# Patient Record
Sex: Female | Born: 1960 | Race: White | Hispanic: No | Smoking: Never smoker
Health system: Southern US, Community
[De-identification: ages and names within clinical notes are randomized; demographics above are authoritative.]

## PROBLEM LIST (undated history)

## (undated) DIAGNOSIS — I509 Heart failure, unspecified: Secondary | ICD-10-CM

## (undated) DIAGNOSIS — R7303 Prediabetes: Secondary | ICD-10-CM

## (undated) DIAGNOSIS — R739 Hyperglycemia, unspecified: Secondary | ICD-10-CM

## (undated) DIAGNOSIS — M1990 Unspecified osteoarthritis, unspecified site: Secondary | ICD-10-CM

## (undated) DIAGNOSIS — I5021 Acute systolic (congestive) heart failure: Secondary | ICD-10-CM

## (undated) DIAGNOSIS — I519 Heart disease, unspecified: Secondary | ICD-10-CM

## (undated) HISTORY — DX: Hyperglycemia, unspecified: R73.9

## (undated) HISTORY — DX: Acute systolic (congestive) heart failure: I50.21

## (undated) HISTORY — DX: Morbid (severe) obesity due to excess calories: E66.01

## (undated) HISTORY — DX: Heart disease, unspecified: I51.9

---

## 2002-04-17 ENCOUNTER — Other Ambulatory Visit: Admission: RE | Admit: 2002-04-17 | Discharge: 2002-04-17 | Payer: Self-pay | Admitting: Obstetrics and Gynecology

## 2004-01-13 ENCOUNTER — Emergency Department (HOSPITAL_COMMUNITY): Admission: EM | Admit: 2004-01-13 | Discharge: 2004-01-13 | Payer: Self-pay | Admitting: Emergency Medicine

## 2004-04-26 ENCOUNTER — Other Ambulatory Visit: Admission: RE | Admit: 2004-04-26 | Discharge: 2004-04-26 | Payer: Self-pay | Admitting: Obstetrics and Gynecology

## 2012-08-19 ENCOUNTER — Ambulatory Visit: Payer: 59

## 2012-08-19 ENCOUNTER — Ambulatory Visit: Payer: 59 | Admitting: Family Medicine

## 2012-08-19 VITALS — BP 128/64 | HR 85 | Temp 98.0°F | Resp 18 | Ht 67.0 in | Wt 315.0 lb

## 2012-08-19 DIAGNOSIS — M25572 Pain in left ankle and joints of left foot: Secondary | ICD-10-CM

## 2012-08-19 DIAGNOSIS — M25579 Pain in unspecified ankle and joints of unspecified foot: Secondary | ICD-10-CM

## 2012-08-19 DIAGNOSIS — S82402A Unspecified fracture of shaft of left fibula, initial encounter for closed fracture: Secondary | ICD-10-CM

## 2012-08-19 MED ORDER — TRAMADOL HCL 50 MG PO TABS: 50.0000 mg | ORAL_TABLET | Freq: Three times a day (TID) | ORAL | Status: DC | PRN

## 2012-08-19 NOTE — Patient Instructions (Signed)
Take the Tramadol when you need it for pain.  You can take this up to every 8 hours if you need it.  Wear the boot for the next 2 weeks.   Come back to see [place] at that time for a recheck.  I will call you with the radiologist results tomorrow.    It was good to meet you.

## 2012-08-19 NOTE — Progress Notes (Signed)
Kimberly Lyons is a 52 y.o. female who presents to Urgent Care today with complaints of Left ankle pain for about 2 weeks:  1. Left ankle pain:  Started when she "stepped wrong" about 2 weeks ago.  Had pain at that time but only 3/10 in pain.  Has been increasing in intensity since then to about 6/10.  Pain lateral Left ankle.  Can point directly where it hurts.  Mild swelling here as well she has noticed.  No redness or calf pain.  Worse when walking across concrete floors while at work.  Has been wearing an ankle brace which helps.  Occasinally walks with crutch at end of day when pain is the worst.  Has been taking 600 mg Ibuprofen 2-3 times a day with some relief.  PMH reviewed.  No past medical history on file. No past surgical history on file.  Medications reviewed. No current outpatient prescriptions on file.   No current facility-administered medications for this visit.    ROS as above otherwise neg.   Physical Exam:  BP 128/64  Pulse 85  Temp(Src) 98 F (36.7 C) (Oral)  Resp 18  Ht 5\' 7"  (1.702 m)  Wt 315 lb (142.883 kg)  BMI 49.32 kg/m2  SpO2 98% Gen:  Alert, cooperative patient who appears stated age in no acute distress.  Vital signs reviewed. Ext:  TTP about 2 cm directly superior to Left lateral malleolus.  Minimal swelling here.  No redness noted. No calf tenderness/swelling/redness.  Area of tenderness is about 4 cm in diameter.  No tenderness/pain with flexion or extension of foot.  Some pain when she stands to bear weight.  She is wearing an ankle brace and using a crutch to ambulate.     [facility] reading (PRIMARY) by  Dr. Gwendolyn Grant:  Questionable healing distal fibular fracture with callus formation noted.  Arterial calcifications present.    Assessment and Plan:  1.  Fibular fracture: - Healing by evidence of callus formation, corroborated by radiologist's read - As she has evidence of healing currently, recommend CAM walker with FU with orthopedist in 1-2 weeks. -  Will call patient with radiologist results of xray.   - Tramadol for pain relief.

## 2012-08-21 ENCOUNTER — Telehealth: Payer: Self-pay | Admitting: Family Medicine

## 2012-08-21 NOTE — Addendum Note (Signed)
Addended byCaffie Damme on: 08/21/2012 02:09 PM   Modules accepted: Orders

## 2012-08-21 NOTE — Telephone Encounter (Signed)
Called and discussed radiologist's report with patient.  She is feeling much better since using CAM walker, only minimal pain.  I recommended she come back in about 2 weeks for repeat radiographs and examination to ensure she is continuing to heal.  I initially put in a referral for Ortho but now do not think this is necessary unless follow up exam shows lack of continued healing.  I dscussed this with her and she prefers to just come back and see [place] at [facility] rather than go to Ortho.  I will forward this to [facility]

## 2012-08-21 NOTE — Telephone Encounter (Signed)
Have cancelled, to you [facility]

## 2012-09-02 ENCOUNTER — Ambulatory Visit: Payer: 59 | Admitting: Physician Assistant

## 2012-09-02 VITALS — BP 140/80 | HR 90 | Temp 98.1°F | Resp 16 | Ht 67.0 in | Wt 319.0 lb

## 2012-09-02 DIAGNOSIS — S82402D Unspecified fracture of shaft of left fibula, subsequent encounter for closed fracture with routine healing: Secondary | ICD-10-CM

## 2012-09-02 DIAGNOSIS — M25579 Pain in unspecified ankle and joints of unspecified foot: Secondary | ICD-10-CM

## 2012-09-02 DIAGNOSIS — Z23 Encounter for immunization: Secondary | ICD-10-CM

## 2012-09-02 DIAGNOSIS — S8290XD Unspecified fracture of unspecified lower leg, subsequent encounter for closed fracture with routine healing: Secondary | ICD-10-CM

## 2012-09-02 NOTE — Progress Notes (Signed)
  Subjective:    Patient ID: Kimberly Lyons, female    DOB: 11/17/1960, 52 y.o.   MRN: [id number]  HPI This 52 y.o. female presents for evaluation of distal fibula fracture. She was initially evaluated 08/19/2012 for the injury that occurred 2 weeks prior. Xray revealed nicely forming callous at the site. Leg is feeling "really good."  Even just the compression helped from the beginning.  Feels stiffness in the ankle upon removing the CAM walker.  No pain with ambulation to the bathroom during the night, even without the CAM walker. Notes that at this point, it's more uncomfortable to walk in the CAM than to not wear it.  Past medical history, surgical history, family history, social history and problem list reviewed.  Review of Systems As above.    Objective:   Physical Exam BP 140/80  Pulse 90  Temp(Src) 98.1 F (36.7 C)  Resp 16  Ht 5\' 7"  (1.702 m)  Wt 319 lb (144.697 kg)  BMI 49.95 kg/m2  SpO2 100% WDWNWF, A&O x 3.  Foot, ankle and distal LEFT leg are normal on inspection.  No ecchymosis.  No tenderness of the foot, ankle.  Tenderness is noted several inches proximal to the lateral malleolus, but is described as mild.     Assessment & Plan:  Closed fibular fracture, left, with routine healing, subsequent encounter  Need for Tdap vaccination - Plan: Tdap vaccine greater than or equal to 7yo IM  Patient Instructions  You may decrease your use of the CAM walker, but you should still wear it at work and when you'll be walking more than around your house. If you note any pain walking without it, please resume use all the time (except bathing). Three times a day, please remove the boot and "write the alphabet" with your foot.  RTC 2 weeks.  Repeat xray at that time.  Fernande Bras, PA-C Physician Assistant-Certified [facility]

## 2012-09-02 NOTE — Patient Instructions (Addendum)
You may decrease your use of the CAM walker, but you should still wear it at work and when you'll be walking more than around your house. If you note any pain walking without it, please resume use all the time (except bathing). Three times a day, please remove the boot and "write the alphabet" with your foot.viTetanus, Diphtheria, Pertussis (Tdap) Vaccine What You Need to Know WHY GET VACCINATED? Tetanus, diphtheria and pertussis can be very serious diseases, even for adolescents and adults. Tdap vaccine can protect [place] from these diseases. TETANUS (Lockjaw) causes painful muscle tightening and stiffness, usually all over the body.  It can lead to tightening of muscles in the head and neck so you can't open your mouth, swallow, or sometimes even breathe. Tetanus kills about 1 out of 5 people who are infected. DIPHTHERIA can cause a thick coating to form in the back of the throat.  It can lead to breathing problems, paralysis, heart failure, and death. PERTUSSIS (Whooping Cough) causes severe coughing spells, which can cause difficulty breathing, vomiting and disturbed sleep.  It can also lead to weight loss, incontinence, and rib fractures. Up to 2 in 100 adolescents and 5 in 100 adults with pertussis are hospitalized or have complications, which could include pneumonia and death. These diseases are caused by bacteria. Diphtheria and pertussis are spread from person to person through coughing or sneezing. Tetanus enters the body through cuts, scratches, or wounds. Before vaccines, the [place] saw as many as 200,000 cases a year of diphtheria and pertussis, and hundreds of cases of tetanus. Since vaccination began, tetanus and diphtheria have dropped by about 99% and pertussis by about 80%. TDAP VACCINE Tdap vaccine can protect adolescents and adults from tetanus, diphtheria, and pertussis. One dose of Tdap is routinely given at age 38 or 65. People who did not get Tdap at that age should get it  as soon as possible. Tdap is especially important for health care professionals and anyone having close contact with a baby younger than 12 months. Pregnant women should get a dose of Tdap during every pregnancy, to protect the newborn from pertussis. Infants are most at risk for severe, life-threatening complications from pertussis. A similar vaccine, called Td, protects from tetanus and diphtheria, but not pertussis. A Td booster should be given every 10 years. Tdap may be given as one of these boosters if you have not already gotten a dose. Tdap may also be given after a severe cut or burn to prevent tetanus infection. Your doctor can give you more information. Tdap may safely be given at the same time as other vaccines. SOME PEOPLE SHOULD NOT GET THIS VACCINE  If you ever had a life-threatening allergic reaction after a dose of any tetanus, diphtheria, or pertussis containing vaccine, OR if you have a severe allergy to any part of this vaccine, you should not get Tdap. Tell your doctor if you have any severe allergies.  If you had a coma, or long or multiple seizures within 7 days after a childhood dose of DTP or DTaP, you should not get Tdap, unless a cause other than the vaccine was found. You can still get Td.  Talk to your doctor if you:  have epilepsy or another nervous system problem,  had severe pain or swelling after any vaccine containing diphtheria, tetanus or pertussis,  ever had Guillain-Barr Syndrome (GBS),  aren't feeling well on the day the shot is scheduled. RISKS OF A VACCINE REACTION With any medicine, including vaccines,  there is a chance of side effects. These are usually mild and go away on their own, but serious reactions are also possible. Brief fainting spells can follow a vaccination, leading to injuries from falling. Sitting or lying down for about 15 minutes can help prevent these. Tell your doctor if you feel dizzy or light-headed, or have vision changes or  ringing in the ears. Mild problems following Tdap (Did not interfere with activities)  Pain where the shot was given (about 3 in 4 adolescents or 2 in 3 adults)  Redness or swelling where the shot was given (about 1 person in 5)  Mild fever of at least 100.73F (up to about 1 in 25 adolescents or 1 in 100 adults)  Headache (about 3 or 4 people in 10)  Tiredness (about 1 person in 3 or 4)  Nausea, vomiting, diarrhea, stomach ache (up to 1 in 4 adolescents or 1 in 10 adults)  Chills, body aches, sore joints, rash, swollen glands (uncommon) Moderate problems following Tdap (Interfered with activities, but did not require medical attention)  Pain where the shot was given (about 1 in 5 adolescents or 1 in 100 adults)  Redness or swelling where the shot was given (up to about 1 in 16 adolescents or 1 in 25 adults)  Fever over 102F (about 1 in 100 adolescents or 1 in 250 adults)  Headache (about 3 in 20 adolescents or 1 in 10 adults)  Nausea, vomiting, diarrhea, stomach ache (up to 1 or 3 people in 100)  Swelling of the entire arm where the shot was given (up to about 3 in 100). Severe problems following Tdap (Unable to perform usual activities, required medical attention)  Swelling, severe pain, bleeding and redness in the arm where the shot was given (rare). A severe allergic reaction could occur after any vaccine (estimated less than 1 in a million doses). WHAT IF THERE IS A SERIOUS REACTION? What should I look for?  Look for anything that concerns you, such as signs of a severe allergic reaction, very high fever, or behavior changes. Signs of a severe allergic reaction can include hives, swelling of the face and throat, difficulty breathing, a fast heartbeat, dizziness, and weakness. These would start a few minutes to a few hours after the vaccination. What should I do?  If you think it is a severe allergic reaction or other emergency that can't wait, call 9-1-1 or get the person  to the nearest hospital. Otherwise, call your doctor.  Afterward, the reaction should be reported to the "Vaccine Adverse Event Reporting System" (VAERS). Your doctor might file this report, or you can do it yourself through the VAERS web site at www.vaers.LAgents.no, or by calling 1-(313)728-8449. VAERS is only for reporting reactions. They do not give medical advice.  THE NATIONAL VACCINE INJURY COMPENSATION PROGRAM The National Vaccine Injury Compensation Program (VICP) is a federal program that was created to compensate people who may have been injured by certain vaccines. Persons who believe they may have been injured by a vaccine can learn about the program and about filing a claim by calling 1-705-251-5616 or visiting the VICP website at SpiritualWord.at. HOW CAN I LEARN MORE?  Ask your doctor.  Call your local or state health department.  Contact the Centers for Disease Control and Prevention (CDC):  Call 671-116-1540 or visit CDC's website at PicCapture.uy CDC Tdap Vaccine VIS (09/07/11) Document Released: 10/17/2011 Document Reviewed: 10/17/2011 [facility] Patient Information 2013 [place], [place].

## 2013-08-23 IMAGING — CR DG TIBIA/FIBULA 2V*L*
2 series · 2 of 2 positions shown · non-contrast
Comparison: None.

CLINICAL DATA: Left ankle pain

LEFT TIBIA AND FIBULA - 2 VIEW

[AP]
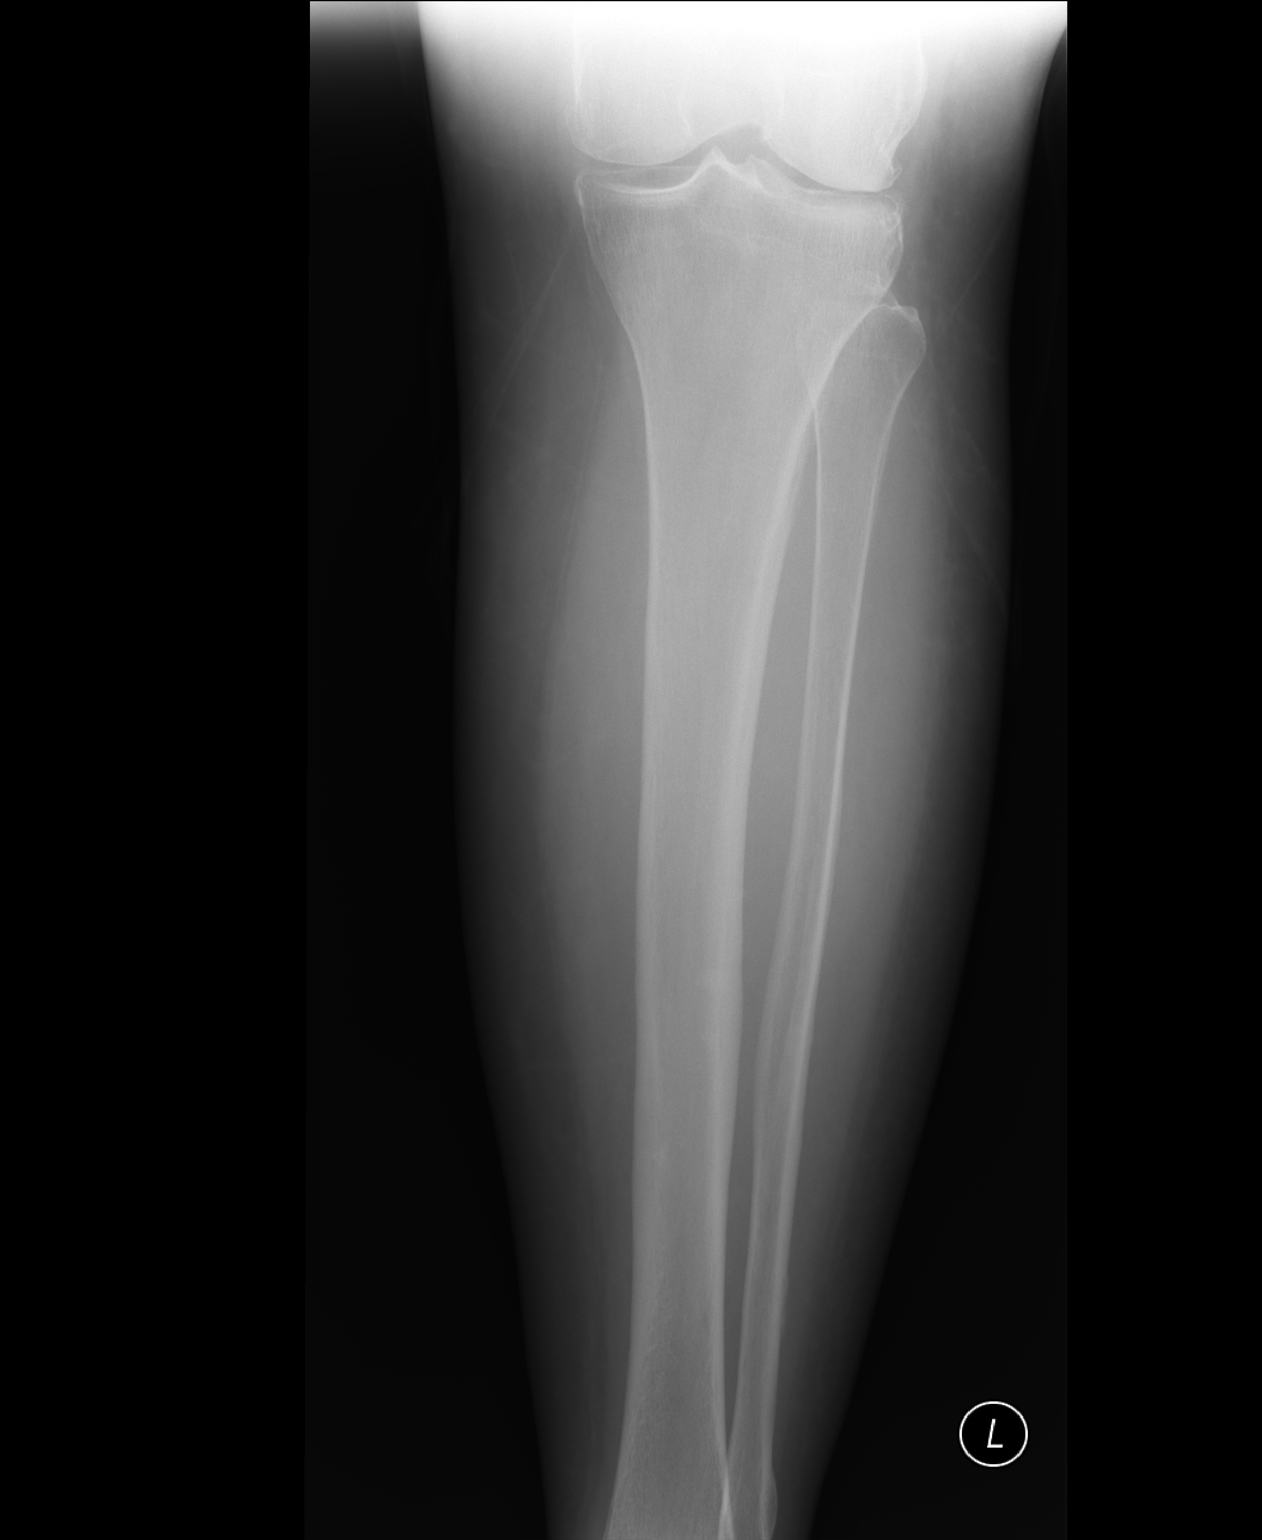

[lateral]
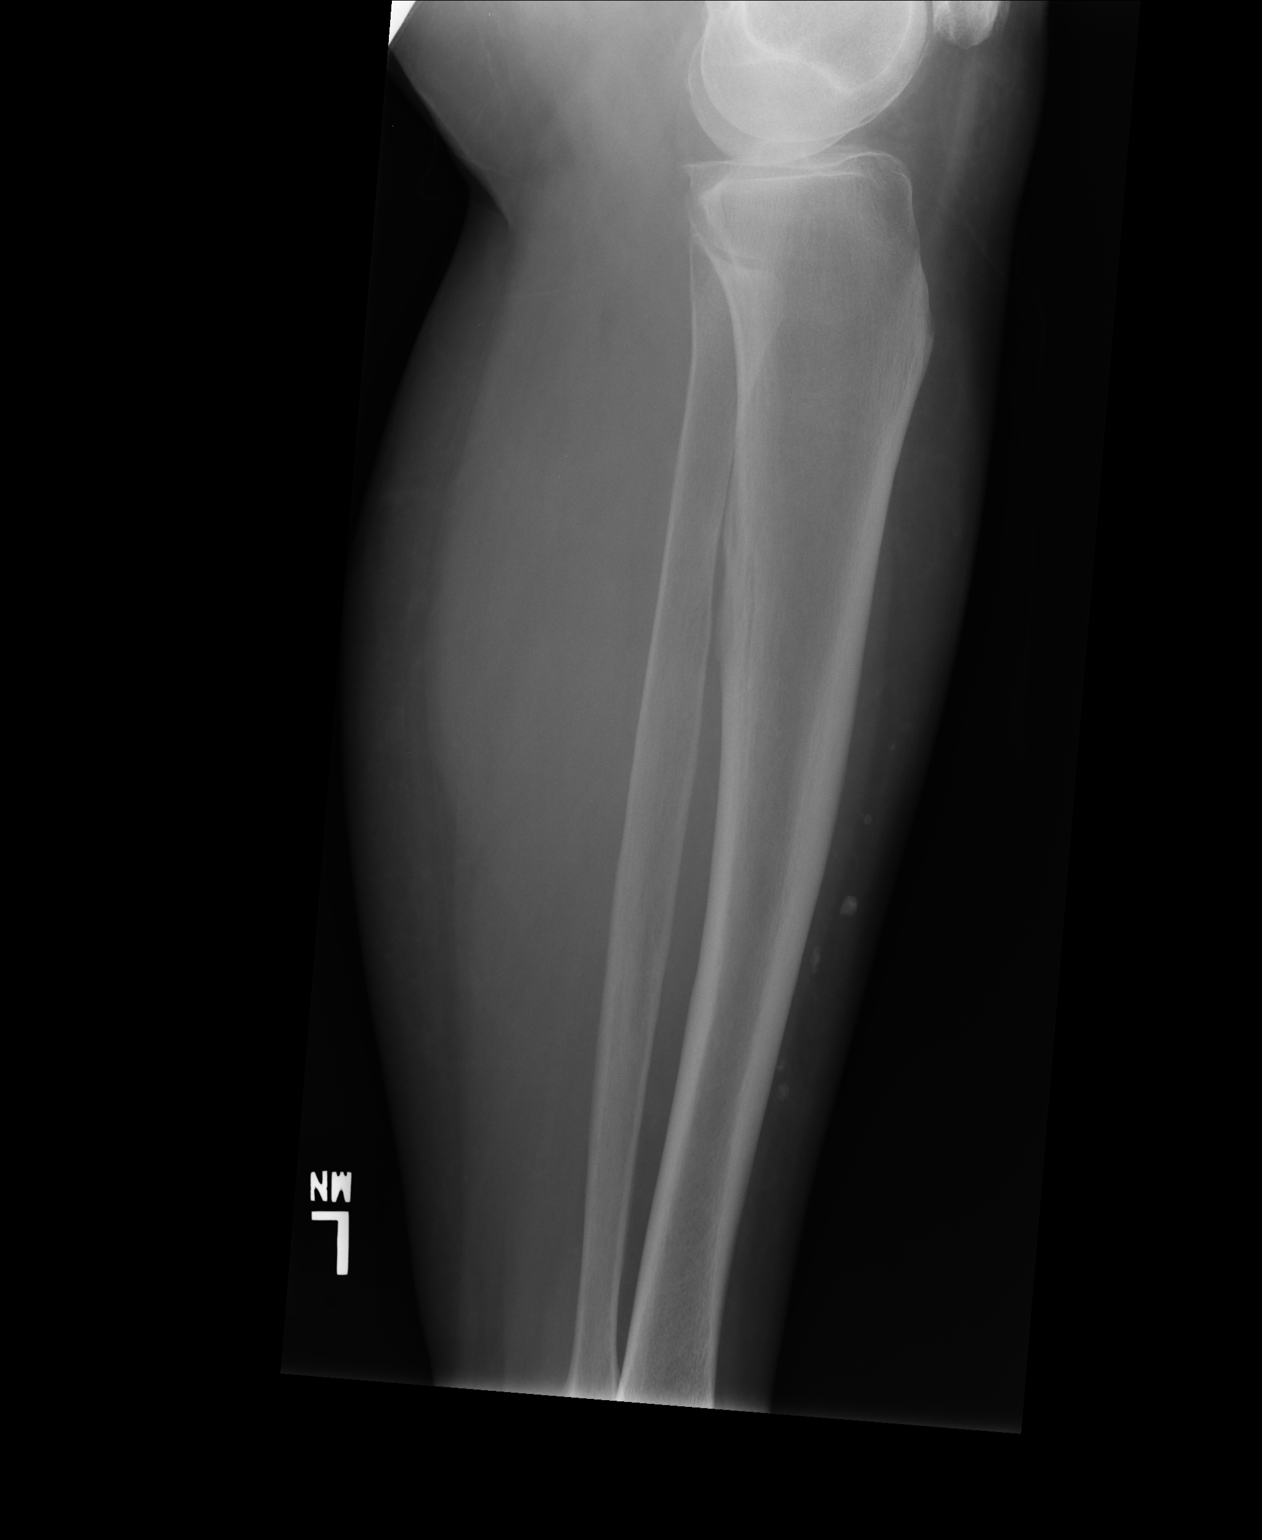

[2 of 2 positions shown; findings below may reference images not displayed]

FINDINGS: No acute fracture or dislocation is noted.  There are
again noted changes suggestive of prior healing fracture in the
distal fibula.  No gross soft tissue abnormality is noted.  A few
small phleboliths are seen.  Mild degenerative changes of the
lateral joint space are noted in the knee
IMPRESSION: Findings suggestive of prior fibular fracture.  Correlation with
clinical history is recommended.

## 2014-03-05 ENCOUNTER — Ambulatory Visit (INDEPENDENT_AMBULATORY_CARE_PROVIDER_SITE_OTHER): Payer: 59 | Admitting: Physician Assistant

## 2014-03-05 VITALS — BP 132/90 | HR 93 | Temp 98.1°F | Resp 18 | Ht 67.25 in | Wt 324.0 lb

## 2014-03-05 DIAGNOSIS — J069 Acute upper respiratory infection, unspecified: Secondary | ICD-10-CM

## 2014-03-05 MED ORDER — HYDROCOD POLST-CHLORPHEN POLST 10-8 MG/5ML PO LQCR: 5.0000 mL | Freq: Two times a day (BID) | ORAL | Status: DC | PRN

## 2014-03-05 MED ORDER — BENZONATATE 100 MG PO CAPS: 100.0000 mg | ORAL_CAPSULE | Freq: Three times a day (TID) | ORAL | Status: DC | PRN

## 2014-03-05 MED ORDER — IPRATROPIUM BROMIDE 0.03 % NA SOLN: 2.0000 | Freq: Two times a day (BID) | NASAL | Status: DC

## 2014-03-05 NOTE — Progress Notes (Signed)
Subjective:    Patient ID: Kimberly Lyons, female    DOB: 08/17/1960, 53 y.o.   MRN: [id number]  HPI Patient presents with sore throat and congestion that started 4 days ago. Congestion of nares worse than chest and sore throat getting progressively worse. Associated sx of HA, cough, feeling warm, and fatigue. Denies SOB/CP, N/V, or sinus/ear pressure. Several co-workers are also feeling poorly. Has tried Mucinex, sudafed, and saline nasal spray with minimal relief. Denies h/o asthma, allergies, or smoking.    Review of Systems  Constitutional: Positive for fatigue. Negative for fever, chills, activity change and appetite change.  HENT: Positive for congestion and sore throat. Negative for ear discharge, ear pain, postnasal drip, rhinorrhea, sinus pressure and sneezing.   Eyes: Negative for pain, discharge, redness and itching.  Respiratory: Positive for cough. Negative for chest tightness, shortness of breath and wheezing.   Cardiovascular: Negative for chest pain and palpitations.  Gastrointestinal: Negative for nausea, vomiting, abdominal pain and diarrhea.  Musculoskeletal: Negative for myalgias, neck pain and neck stiffness.  Allergic/Immunologic: Negative for environmental allergies and food allergies.  Neurological: Positive for headaches. Negative for dizziness and light-headedness.  Hematological: Negative for adenopathy.       Objective:   Physical Exam  Constitutional: She is oriented to person, place, and time. She appears well-developed and well-nourished.  Blood pressure 132/90, pulse 93, temperature 98.1 F (36.7 C), temperature source Oral, resp. rate 18, height 5' 7.25" (1.708 m), weight 324 lb (146.965 kg), SpO2 98 %.   HENT:  Head: Normocephalic and atraumatic.  Right Ear: Tympanic membrane, external ear and ear canal normal. No drainage, swelling or tenderness.  Left Ear: Tympanic membrane, external ear and ear canal normal. Left ear middle ear effusion: serous.    Nose: Rhinorrhea present. No mucosal edema. Right sinus exhibits no maxillary sinus tenderness and no frontal sinus tenderness. Left sinus exhibits no maxillary sinus tenderness and no frontal sinus tenderness.  Mouth/Throat: Uvula is midline and mucous membranes are normal. Posterior oropharyngeal erythema (minimal) present. No oropharyngeal exudate or posterior oropharyngeal edema.  Eyes: Conjunctivae and EOM are normal. Pupils are equal, round, and reactive to light. Right eye exhibits no discharge. Left eye exhibits no discharge. No scleral icterus.  Neck: Neck supple. No thyromegaly present.  Cardiovascular: Normal rate, regular rhythm and normal heart sounds.  Exam reveals no gallop and no friction rub.   No murmur heard. Pulmonary/Chest: Effort normal and breath sounds normal. No respiratory distress. She has no wheezes. She has no rales.  Abdominal: Soft. Bowel sounds are normal. There is no tenderness.  Lymphadenopathy:    She has cervical adenopathy.  Neurological: She is alert and oriented to person, place, and time.  Skin: Skin is warm and dry. No rash noted. No erythema. No pallor.        Assessment & Plan:  1. Viral upper respiratory illness - chlorpheniramine-HYDROcodone (TUSSIONEX PENNKINETIC ER) 10-8 MG/5ML LQCR; Take 5 mLs by mouth every 12 (twelve) hours as needed for cough (cough).  Dispense: 60 mL; Refill: 0 - benzonatate (TESSALON) 100 MG capsule; Take 1-2 capsules (100-200 mg total) by mouth 3 (three) times daily as needed for cough.  Dispense: 40 capsule; Refill: 0 - ipratropium (ATROVENT) 0.03 % nasal spray; Place 2 sprays into both nostrils 2 (two) times daily.  Dispense: 30 mL; Refill: 0 - Continue mucinex at home and get plenty of fluid and rest. RTC if no improvement of sx or fever develops.  Oriana Horiuchi PA-C  [facility] 03/05/2014 5:46 PM

## 2014-03-06 NOTE — Progress Notes (Signed)
I was directly involved with the patient's care and agree with the physical, diagnosis and treatment plan.  

## 2014-05-10 ENCOUNTER — Ambulatory Visit (INDEPENDENT_AMBULATORY_CARE_PROVIDER_SITE_OTHER): Payer: 59 | Admitting: Physician Assistant

## 2014-05-10 VITALS — BP 132/80 | HR 102 | Temp 98.1°F | Resp 20 | Ht 61.5 in | Wt 318.0 lb

## 2014-05-10 DIAGNOSIS — J069 Acute upper respiratory infection, unspecified: Secondary | ICD-10-CM

## 2014-05-10 DIAGNOSIS — B9789 Other viral agents as the cause of diseases classified elsewhere: Principal | ICD-10-CM

## 2014-05-10 MED ORDER — GUAIFENESIN ER 1200 MG PO TB12: 1.0000 | ORAL_TABLET | Freq: Two times a day (BID) | ORAL | Status: DC | PRN

## 2014-05-10 MED ORDER — HYDROCOD POLST-CHLORPHEN POLST 10-8 MG/5ML PO LQCR: 5.0000 mL | Freq: Two times a day (BID) | ORAL | Status: DC | PRN

## 2014-05-10 NOTE — Patient Instructions (Signed)
Take mucinex twice a day. Buy nasal spray afrin over the counter. May use twice a day for three days - NO longer than 3 days. Use cough syrup at night for sleep. Return in 1 week if symptoms are not improving.

## 2014-05-10 NOTE — Progress Notes (Signed)
Subjective:    Patient ID: Kimberly BrothersSusan P Lyons, female    DOB: 12/03/1960, 54 y.o.   MRN: [id number]  HPI  This is a 54 year old female presenting with 5 days of nasal congestion and coughing. She feels her symptoms are generally improving however her cough is keeping her from sleeping. The cough was initially productive but is now dry. She is having some SOB with exertion. She denies wheezing. She denies sore throat, otalgia, sinus pressure or fever. She has had some chills. She is taking mucinex and sudafed which she felt was working initially but not anymore. She does not have a history of asthma and is not a smoker. She has problems with environmental allergies in the fall and feels this fall has been worse than prior. She was sick with a viral URI 2.5 months ago which resolved with treatment and rest. She was prescribed tussionex which worked well for her and atrovent which she states did not work for her.  Review of Systems  Constitutional: Positive for chills. Negative for fever.  HENT: Positive for congestion. Negative for ear pain, sinus pressure and sore throat.   Respiratory: Positive for choking and shortness of breath. Negative for wheezing.   Cardiovascular: Negative for chest pain.  Gastrointestinal: Negative for nausea, vomiting and abdominal pain.  Skin: Negative for rash.  Allergic/Immunologic: Positive for environmental allergies.  Hematological: Negative for adenopathy.  Psychiatric/Behavioral: Positive for sleep disturbance.   There are no active problems to display for this patient.  Home Meds: None  No Known Allergies  Patient's social and family history were reviewed.     Objective:   Physical Exam  Constitutional: She is oriented to person, place, and time. She appears well-developed and well-nourished. No distress.  HENT:  Head: Normocephalic and atraumatic.  Right Ear: Hearing, tympanic membrane, external ear and ear canal normal.  Left Ear: Hearing, tympanic  membrane, external ear and ear canal normal.  Nose: Nose normal. Right sinus exhibits no maxillary sinus tenderness and no frontal sinus tenderness. Left sinus exhibits no maxillary sinus tenderness and no frontal sinus tenderness.  Mouth/Throat: Uvula is midline, oropharynx is clear and moist and mucous membranes are normal.  Eyes: Conjunctivae and lids are normal. Right eye exhibits no discharge. Left eye exhibits no discharge. No scleral icterus.  Cardiovascular: Regular rhythm, normal heart sounds, intact distal pulses and normal pulses.   No murmur heard. Slight tachycardia to 102  Pulmonary/Chest: Effort normal and breath sounds normal. No respiratory distress. She has no wheezes. She has no rhonchi. She has no rales.  Musculoskeletal: Normal range of motion.  Lymphadenopathy:    She has no cervical adenopathy.  Neurological: She is alert and oriented to person, place, and time.  Skin: Skin is warm, dry and intact. No lesion and no rash noted.  Psychiatric: She has a normal mood and affect. Her speech is normal and behavior is normal. Thought content normal.   BP 132/80 mmHg  Pulse 102  Temp(Src) 98.1 F (36.7 C) (Oral)  Resp 20  Ht 5' 1.5" (1.562 m)  Wt 318 lb (144.244 kg)  BMI 59.12 kg/m2  SpO2 96%     Assessment & Plan:  1. Viral URI with cough This is likely an improving viral URI with lingering cough. She will stop sudafed and start using mucinex. Atrovent was not helpful in the past - she will buy OTC afrin and use for max of 3 days. Cough syrup to help with sleep. Will return in  7-10 days if symptoms worsen or fail to improve.  - Guaifenesin (MUCINEX MAXIMUM STRENGTH) 1200 MG TB12; Take 1 tablet (1,200 mg total) by mouth every 12 (twelve) hours as needed.  Dispense: 14 tablet; Refill: 1 - chlorpheniramine-HYDROcodone (TUSSIONEX PENNKINETIC ER) 10-8 MG/5ML LQCR; Take 5 mLs by mouth every 12 (twelve) hours as needed for cough (cough).  Dispense: 80 mL; Refill: 0   Geraldy Akridge  V. Dyke Brackett, MHS [facility]  05/10/2014

## 2014-05-11 ENCOUNTER — Telehealth: Payer: Self-pay

## 2014-05-11 DIAGNOSIS — J209 Acute bronchitis, unspecified: Secondary | ICD-10-CM

## 2014-05-11 NOTE — Telephone Encounter (Signed)
Pt states she came in and saw Dorna Leitzicole Bush V, PA-C at 05/10/2014 9:29 AM  for Viral URI with cough. Pt states that she is worse today, and would like an antibiotic. Please advise

## 2014-05-12 MED ORDER — AZITHROMYCIN 250 MG PO TABS: ORAL_TABLET | ORAL | Status: DC

## 2014-05-12 NOTE — Telephone Encounter (Signed)
Called and left message with pt that I sent in an antibiotic since patient's symptoms have worsened.

## 2018-07-28 ENCOUNTER — Other Ambulatory Visit: Payer: Self-pay

## 2018-07-28 ENCOUNTER — Inpatient Hospital Stay (HOSPITAL_COMMUNITY)
Admission: EM | Admit: 2018-07-28 | Discharge: 2018-07-30 | DRG: 291 | Disposition: A | Discharge status: Home / Self Care | Payer: Managed Care, Other (non HMO) | Attending: Internal Medicine | Admitting: Internal Medicine

## 2018-07-28 ENCOUNTER — Encounter (HOSPITAL_COMMUNITY): Payer: Self-pay

## 2018-07-28 ENCOUNTER — Emergency Department (HOSPITAL_COMMUNITY): Payer: Managed Care, Other (non HMO)

## 2018-07-28 DIAGNOSIS — Z20828 Contact with and (suspected) exposure to other viral communicable diseases: Secondary | ICD-10-CM | POA: Diagnosis present

## 2018-07-28 DIAGNOSIS — Z8349 Family history of other endocrine, nutritional and metabolic diseases: Secondary | ICD-10-CM | POA: Diagnosis not present

## 2018-07-28 DIAGNOSIS — E1165 Type 2 diabetes mellitus with hyperglycemia: Secondary | ICD-10-CM | POA: Diagnosis present

## 2018-07-28 DIAGNOSIS — I5031 Acute diastolic (congestive) heart failure: Secondary | ICD-10-CM | POA: Diagnosis present

## 2018-07-28 DIAGNOSIS — I10 Essential (primary) hypertension: Secondary | ICD-10-CM | POA: Diagnosis not present

## 2018-07-28 DIAGNOSIS — I493 Ventricular premature depolarization: Secondary | ICD-10-CM | POA: Diagnosis present

## 2018-07-28 DIAGNOSIS — E872 Acidosis: Secondary | ICD-10-CM | POA: Diagnosis present

## 2018-07-28 DIAGNOSIS — N179 Acute kidney failure, unspecified: Secondary | ICD-10-CM | POA: Diagnosis present

## 2018-07-28 DIAGNOSIS — I509 Heart failure, unspecified: Secondary | ICD-10-CM

## 2018-07-28 DIAGNOSIS — Z6841 Body Mass Index (BMI) 40.0 and over, adult: Secondary | ICD-10-CM | POA: Diagnosis not present

## 2018-07-28 DIAGNOSIS — I447 Left bundle-branch block, unspecified: Secondary | ICD-10-CM | POA: Diagnosis present

## 2018-07-28 DIAGNOSIS — I161 Hypertensive emergency: Secondary | ICD-10-CM | POA: Diagnosis present

## 2018-07-28 DIAGNOSIS — J810 Acute pulmonary edema: Secondary | ICD-10-CM | POA: Diagnosis not present

## 2018-07-28 DIAGNOSIS — J9601 Acute respiratory failure with hypoxia: Secondary | ICD-10-CM | POA: Diagnosis present

## 2018-07-28 DIAGNOSIS — I110 Hypertensive heart disease with heart failure: Principal | ICD-10-CM | POA: Diagnosis present

## 2018-07-28 DIAGNOSIS — R0602 Shortness of breath: Secondary | ICD-10-CM

## 2018-07-28 DIAGNOSIS — Z833 Family history of diabetes mellitus: Secondary | ICD-10-CM

## 2018-07-28 DIAGNOSIS — Z8249 Family history of ischemic heart disease and other diseases of the circulatory system: Secondary | ICD-10-CM

## 2018-07-28 LAB — POCT I-STAT EG7
ACID-BASE DEFICIT: 12 mmol/L — AB (ref 0.0–2.0)
Bicarbonate: 17.1 mmol/L — ABNORMAL LOW (ref 20.0–28.0)
CALCIUM ION: 1.09 mmol/L — AB (ref 1.15–1.40)
HEMATOCRIT: 47 % — AB (ref 36.0–46.0)
HEMOGLOBIN: 16 g/dL — AB (ref 12.0–15.0)
O2 SAT: 99 %
PH VEN: 7.143 — AB (ref 7.250–7.430)
POTASSIUM: 4.3 mmol/L (ref 3.5–5.1)
SODIUM: 138 mmol/L (ref 135–145)
TCO2: 19 mmol/L — ABNORMAL LOW (ref 22–32)
pCO2, Ven: 49.8 mmHg (ref 44.0–60.0)
pO2, Ven: 158 mmHg — ABNORMAL HIGH (ref 32.0–45.0)

## 2018-07-28 LAB — HEMOGLOBIN A1C
Hgb A1c MFr Bld: 6.1 % — ABNORMAL HIGH (ref 4.8–5.6)
Mean Plasma Glucose: 128.37 mg/dL

## 2018-07-28 LAB — BASIC METABOLIC PANEL
ANION GAP: 18 — AB (ref 5–15)
Anion gap: 10 (ref 5–15)
BUN: 14 mg/dL (ref 6–20)
BUN: 13 mg/dL (ref 6–20)
CALCIUM: 8.3 mg/dL — AB (ref 8.9–10.3)
CO2: 14 mmol/L — ABNORMAL LOW (ref 22–32)
CO2: 22 mmol/L (ref 22–32)
Calcium: 9.1 mg/dL (ref 8.9–10.3)
Chloride: 104 mmol/L (ref 98–111)
Chloride: 108 mmol/L (ref 98–111)
Creatinine, Ser: 1.28 mg/dL — ABNORMAL HIGH (ref 0.44–1.00)
Creatinine, Ser: 0.97 mg/dL (ref 0.44–1.00)
GFR calc Af Amer: 53 mL/min — ABNORMAL LOW (ref >60)
GFR calc Af Amer: >60 mL/min (ref >60)
GFR calc non Af Amer: >60 mL/min (ref >60)
GFR, EST NON AFRICAN AMERICAN: 45 mL/min — AB (ref >60)
GLUCOSE: 371 mg/dL — AB (ref 70–99)
GLUCOSE: 112 mg/dL — AB (ref 70–99)
Potassium: 4.5 mmol/L (ref 3.5–5.1)
Potassium: 3.8 mmol/L (ref 3.5–5.1)
Sodium: 136 mmol/L (ref 135–145)
Sodium: 140 mmol/L (ref 135–145)

## 2018-07-28 LAB — HEPATIC FUNCTION PANEL
ALK PHOS: 100 U/L (ref 38–126)
ALT: 65 U/L — ABNORMAL HIGH (ref 0–44)
AST: 59 U/L — ABNORMAL HIGH (ref 15–41)
Albumin: 3.5 g/dL (ref 3.5–5.0)
Bilirubin, Direct: 0.2 mg/dL (ref 0.0–0.2)
Indirect Bilirubin: 0.5 mg/dL (ref 0.3–0.9)
Total Bilirubin: 0.7 mg/dL (ref 0.3–1.2)
Total Protein: 7 g/dL (ref 6.5–8.1)

## 2018-07-28 LAB — CBG MONITORING, ED
GLUCOSE-CAPILLARY: 324 mg/dL — AB (ref 70–99)
Glucose-Capillary: 125 mg/dL — ABNORMAL HIGH (ref 70–99)

## 2018-07-28 LAB — CBC WITH DIFFERENTIAL/PLATELET
Abs Immature Granulocytes: 0.46 10*3/uL — ABNORMAL HIGH (ref 0.00–0.07)
Basophils Absolute: 0.1 10*3/uL (ref 0.0–0.1)
Basophils Relative: 1 %
EOS ABS: 0.3 10*3/uL (ref 0.0–0.5)
EOS PCT: 2 %
HCT: 49.6 % — ABNORMAL HIGH (ref 36.0–46.0)
Hemoglobin: 15 g/dL (ref 12.0–15.0)
IMMATURE GRANULOCYTES: 3 %
Lymphocytes Relative: 29 %
Lymphs Abs: 4.5 10*3/uL — ABNORMAL HIGH (ref 0.7–4.0)
MCH: 28.5 pg (ref 26.0–34.0)
MCHC: 30.2 g/dL (ref 30.0–36.0)
MCV: 94.1 fL (ref 80.0–100.0)
MONO ABS: 0.9 10*3/uL (ref 0.1–1.0)
MONOS PCT: 6 %
Neutro Abs: 9.6 10*3/uL — ABNORMAL HIGH (ref 1.7–7.7)
Neutrophils Relative %: 59 %
PLATELETS: 353 10*3/uL (ref 150–400)
RBC: 5.27 MIL/uL — ABNORMAL HIGH (ref 3.87–5.11)
RDW: 13.9 % (ref 11.5–15.5)
WBC: 15.9 10*3/uL — ABNORMAL HIGH (ref 4.0–10.5)
nRBC: 0 % (ref 0.0–0.2)

## 2018-07-28 LAB — I-STAT CREATININE, ED: CREATININE: 1 mg/dL (ref 0.44–1.00)

## 2018-07-28 LAB — CBC
HCT: 44.8 % (ref 36.0–46.0)
Hemoglobin: 14.3 g/dL (ref 12.0–15.0)
MCH: 28.4 pg (ref 26.0–34.0)
MCHC: 31.9 g/dL (ref 30.0–36.0)
MCV: 88.9 fL (ref 80.0–100.0)
Platelets: 288 10*3/uL (ref 150–400)
RBC: 5.04 MIL/uL (ref 3.87–5.11)
RDW: 14 % (ref 11.5–15.5)
WBC: 14.1 10*3/uL — ABNORMAL HIGH (ref 4.0–10.5)
nRBC: 0 % (ref 0.0–0.2)

## 2018-07-28 LAB — TROPONIN I: Troponin I: <0.03 ng/mL (ref <0.03)

## 2018-07-28 LAB — FERRITIN: Ferritin: 63 ng/mL (ref 11–307)

## 2018-07-28 LAB — GLUCOSE, CAPILLARY: Glucose-Capillary: 105 mg/dL — ABNORMAL HIGH (ref 70–99)

## 2018-07-28 LAB — URINALYSIS, ROUTINE W REFLEX MICROSCOPIC
Bilirubin Urine: NEGATIVE
Glucose, UA: 50 mg/dL — AB
Hgb urine dipstick: NEGATIVE
Ketones, ur: NEGATIVE mg/dL
Leukocytes,Ua: NEGATIVE
Nitrite: NEGATIVE
Protein, ur: NEGATIVE mg/dL
Specific Gravity, Urine: 1.006 (ref 1.005–1.030)
pH: 5 (ref 5.0–8.0)

## 2018-07-28 LAB — LACTIC ACID, PLASMA
Lactic Acid, Venous: 2.8 mmol/L (ref 0.5–1.9)
Lactic Acid, Venous: 1.8 mmol/L (ref 0.5–1.9)

## 2018-07-28 LAB — PROCALCITONIN: Procalcitonin: 0.6 ng/mL

## 2018-07-28 LAB — BRAIN NATRIURETIC PEPTIDE: B Natriuretic Peptide: 463.1 pg/mL — ABNORMAL HIGH (ref 0.0–100.0)

## 2018-07-28 LAB — LACTATE DEHYDROGENASE: LDH: 231 U/L — ABNORMAL HIGH (ref 98–192)

## 2018-07-28 LAB — C-REACTIVE PROTEIN: CRP: 1.4 mg/dL — ABNORMAL HIGH (ref <1.0)

## 2018-07-28 MED ORDER — NITROGLYCERIN IN D5W 200-5 MCG/ML-% IV SOLN
INTRAVENOUS | Status: AC
  Administered 2018-07-28: 50 mg
  Filled 2018-07-28: qty 250

## 2018-07-28 MED ORDER — HYDRALAZINE HCL 10 MG PO TABS
10.0000 mg | ORAL_TABLET | Freq: Three times a day (TID) | ORAL | Status: DC
  Administered 2018-07-28 – 2018-07-29 (×4): 10 mg via ORAL
  Filled 2018-07-28 (×5): qty 1

## 2018-07-28 MED ORDER — SODIUM CHLORIDE 0.9 % IV SOLN: 250.0000 mL | INTRAVENOUS | Status: DC | PRN

## 2018-07-28 MED ORDER — INSULIN ASPART 100 UNIT/ML ~~LOC~~ SOLN
3.0000 [IU] | Freq: Three times a day (TID) | SUBCUTANEOUS | Status: DC
  Administered 2018-07-29 (×2): 3 [IU] via SUBCUTANEOUS

## 2018-07-28 MED ORDER — INSULIN ASPART 100 UNIT/ML ~~LOC~~ SOLN
0.0000 [IU] | Freq: Three times a day (TID) | SUBCUTANEOUS | Status: DC
  Administered 2018-07-29 (×2): 1 [IU] via SUBCUTANEOUS

## 2018-07-28 MED ORDER — FUROSEMIDE 10 MG/ML IJ SOLN
40.0000 mg | Freq: Two times a day (BID) | INTRAMUSCULAR | Status: DC
  Administered 2018-07-28 – 2018-07-30 (×4): 40 mg via INTRAVENOUS
  Filled 2018-07-28 (×4): qty 4

## 2018-07-28 MED ORDER — INSULIN ASPART PROT & ASPART (70-30 MIX) 100 UNIT/ML ~~LOC~~ SUSP
7.0000 [IU] | Freq: Two times a day (BID) | SUBCUTANEOUS | Status: DC
  Administered 2018-07-28: 7 [IU] via SUBCUTANEOUS
  Filled 2018-07-28: qty 10

## 2018-07-28 MED ORDER — LORAZEPAM 2 MG/ML IJ SOLN: 0.5000 mg | Freq: Once | INTRAMUSCULAR | Status: DC

## 2018-07-28 MED ORDER — SODIUM CHLORIDE 0.9% FLUSH
3.0000 mL | Freq: Two times a day (BID) | INTRAVENOUS | Status: DC
  Administered 2018-07-28 – 2018-07-30 (×4): 3 mL via INTRAVENOUS

## 2018-07-28 MED ORDER — SODIUM CHLORIDE 0.9% FLUSH: 3.0000 mL | INTRAVENOUS | Status: DC | PRN

## 2018-07-28 MED ORDER — METFORMIN HCL 500 MG PO TABS: 500.0000 mg | ORAL_TABLET | Freq: Every day | ORAL | Status: DC

## 2018-07-28 MED ORDER — ACETAMINOPHEN 325 MG PO TABS: 650.0000 mg | ORAL_TABLET | ORAL | Status: DC | PRN

## 2018-07-28 MED ORDER — LEVOFLOXACIN 500 MG PO TABS
500.0000 mg | ORAL_TABLET | Freq: Every day | ORAL | Status: DC
  Filled 2018-07-28: qty 1

## 2018-07-28 MED ORDER — ONDANSETRON HCL 4 MG/2ML IJ SOLN
4.0000 mg | Freq: Four times a day (QID) | INTRAMUSCULAR | Status: DC | PRN
  Administered 2018-07-28: 4 mg via INTRAVENOUS
  Filled 2018-07-28: qty 2

## 2018-07-28 MED ORDER — ISOSORBIDE MONONITRATE ER 30 MG PO TB24
15.0000 mg | ORAL_TABLET | Freq: Every day | ORAL | Status: DC
  Administered 2018-07-28 – 2018-07-29 (×2): 15 mg via ORAL
  Filled 2018-07-28 (×2): qty 1

## 2018-07-28 MED ORDER — ENOXAPARIN SODIUM 80 MG/0.8ML ~~LOC~~ SOLN
70.0000 mg | SUBCUTANEOUS | Status: DC
  Administered 2018-07-28 – 2018-07-29 (×2): 70 mg via SUBCUTANEOUS
  Filled 2018-07-28 (×2): qty 0.8

## 2018-07-28 MED ORDER — FUROSEMIDE 10 MG/ML IJ SOLN
40.0000 mg | Freq: Once | INTRAMUSCULAR | Status: AC
  Administered 2018-07-28: 40 mg via INTRAVENOUS
  Filled 2018-07-28: qty 4

## 2018-07-28 NOTE — ED Notes (Addendum)
Put pure wick on pt. 

## 2018-07-28 NOTE — H&P (Addendum)
History and Physical  Kimberly Lyons IZT:[id number] DOB: 02-16-1961 DOA: 07/28/2018  PCP: Patient, No Pcp Per   Chief Complaint: SOB  HPI:  58 year old female with no known medical illnesses became suddenly short of breath last night with cough and acutely worse.  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  Edema?  Infection--- my over read of the chest x-ray shows patchy infiltrates reticulonodular pattern fullness of the hilum with no blunting of the costophrenic angle I am limited because this is a 1 view chest x-ray  Urine analysis shows no ketones 50 of glucose    Review of Systems:    History reviewed. No pertinent past medical history.  History reviewed. No pertinent surgical history.   reports that she has never smoked. She has never used smokeless tobacco. She reports that she does not drink alcohol or use drugs. Mobility: She is independent at baseline able to do multiple things  No Known Allergies  Family History  Problem Relation Age of Onset  . Hyperlipidemia Mother   . Hypertension Mother   . Cancer Father   . Hypertension Father   . Cancer Sister   . Diabetes  Sister   . Hyperlipidemia Sister      Prior to Admission medications   Medication Sig Start Date End Date Taking? Authorizing Provider  azithromycin (ZITHROMAX) 250 MG tablet Take 2 tabs PO x 1 dose, then 1 tab PO QD x 4 days Patient not taking: Reported on 07/28/2018 05/12/14   Dorna Leitz, PA-C  chlorpheniramine-HYDROcodone (TUSSIONEX PENNKINETIC ER) 10-8 MG/5ML LQCR Take 5 mLs by mouth every 12 (twelve) hours as needed for cough (cough). Patient not taking: Reported on 07/28/2018 05/10/14   Dorna Leitz, PA-C  Guaifenesin [facility] MAXIMUM STRENGTH) 1200 MG TB12 Take 1 tablet (1,200 mg total) by mouth every 12 (twelve) hours as needed. Patient not taking: Reported on 07/28/2018 05/10/14   [place] Mam    Physical Exam:   Morbidly overweight thick neck Mallampati 2, GCS 15,  EOMI NCAT  S1-S2 no murmur rub or gallop  Chest clear no added sound  Abdomen soft nontender no rebound no guarding  Neurologically intact no focal deficit     I have personally reviewed following labs and imaging studies  Labs:   pH on arrival 7.1 PCO2 49 PaO2 158 but this was a venous gas  BUN/creatinine 14/1.2  CO2 14  Gap 18  AST ALT 59/65 bilirubin normal  LDH 231  CRP 1.4  Lactic acid 2.8-->1.8  WBC 15.9, hemoglobin 15, platelet 353  Imaging studies:  IMPRESSION: Increased interstitial markings with lower lobe/infrahilar opacities, favoring mild interstitial edema, less likely mild  infection.  Medical tests:   EKG independently reviewed: Sinus tachycardia T wave elevations  Test discussed with performing physician:  Yes  Decision to obtain old records:   Yes  Review and summation of old records:   Yes  Active Problems:   * No active hospital problems. *   Assessment/Plan  hf vs covid chf and HTn urgency ddx Constellation of clinical findings in addition to new onset metabolic acidosis and inflammatory markers could be consistent with COVID BNP  is an acute phase reactant in addition--PCT is low-- No risk factors--only outdoors and does have LE edema--this could also be heart failure Placed on Nitro Gtt in ED-given Lasix x 1--Diuresed already ~1 liter At this stage she does not require any respiratory support and is satting reasonably on room air after diuresis New onset diabetes mellitus Metabolic acidosis--either 2/2 HF and poor perfusion vs DKA Order A1c Given 70/30 insulin 7 units with x1--continue LAsix IV bid low dose Place on CBG 4 times daily AC at bedtime Will rpt Bmet at 1703 showed resolution of acidosis---not in DKA and was stress reponse BMI > 45 Needs OP counseling Acute diastolic HF HTN urgency potential etiology Given IV Lasix x1 in the ED Repeat chest x-ray a.m. Get echo non-emergently     detailed discussion with the patient  low risk category and monitor on Tele at least 2 midnights   DVT prophylaxis:lovenox Code Status: Full Family Communication: patient daughter Ladona Ridgel available [phone] Wants to update herself Consults called: none    Time spent: 65 minutes  Pleas Koch, MD [facility] 1:15 PM   07/28/2018, 12:18 PM

## 2018-07-28 NOTE — Progress Notes (Signed)
CSW received consult for transportation support. CSW will follow and provide support as needed.  Tenna Delaine, LCSW, LCAS-A Clinical Social Worker II [phone]

## 2018-07-28 NOTE — ED Notes (Addendum)
Ulanda Zellar [phone]   Daughter would like to be updated when possible.

## 2018-07-28 NOTE — ED Provider Notes (Addendum)
[facility] EMERGENCY DEPARTMENT Provider Note   CSN: [id number] Arrival date & time: 07/28/18  1007    History   Chief Complaint Chief Complaint  Patient presents with  . Shortness of Breath    HPI Kimberly Lyons is a 58 y.o. female with history of obesity, but no other known medical problems who presents with sudden onset shortness of breath and cough that began around midnight.  She attributes the cough to having a fan on, but it stopped when she turned the fan off.  Patient reports she has been dealing with some allergy symptoms, like sneezing and nasal congestion, however otherwise was feeling well yesterday.  She denies any fevers.  She denies any chest pain, abdominal pain, nausea, vomiting, new leg pain or swelling.  Patient has not seen a doctor in several years.  She does not know of any medical problems.  No medications taken prior to arrival.  Patient arrives via EMS on nonrebreather satting only around 88%.     HPI  History reviewed. No pertinent past medical history.  There are no active problems to display for this patient.   History reviewed. No pertinent surgical history.   OB History   No obstetric history on file.      Home Medications    Prior to Admission medications   Medication Sig Start Date End Date Taking? Authorizing Provider  azithromycin (ZITHROMAX) 250 MG tablet Take 2 tabs PO x 1 dose, then 1 tab PO QD x 4 days Patient not taking: Reported on 07/28/2018 05/12/14   Dorna Leitz, PA-C  chlorpheniramine-HYDROcodone (TUSSIONEX PENNKINETIC ER) 10-8 MG/5ML LQCR Take 5 mLs by mouth every 12 (twelve) hours as needed for cough (cough). Patient not taking: Reported on 07/28/2018 05/10/14   Dorna Leitz, PA-C  Guaifenesin [facility] MAXIMUM STRENGTH) 1200 MG TB12 Take 1 tablet (1,200 mg total) by mouth every 12 (twelve) hours as needed. Patient not taking: Reported on 07/28/2018 05/10/14   [place] Mam    Family History Family  History  Problem Relation Age of Onset  . Hyperlipidemia Mother   . Hypertension Mother   . Cancer Father   . Hypertension Father   . Cancer Sister   . Diabetes Sister   . Hyperlipidemia Sister     Social History Social History   Tobacco Use  . Smoking status: Never Smoker  . Smokeless tobacco: Never Used  Substance Use Topics  . Alcohol use: No  . Drug use: No     Allergies   Patient has no known allergies.   Review of Systems Review of Systems  Constitutional: Negative for chills and fever.  HENT: Negative for facial swelling and sore throat.   Respiratory: Positive for cough and shortness of breath.   Cardiovascular: Negative for chest pain and leg swelling.  Gastrointestinal: Negative for abdominal pain, nausea and vomiting.  Genitourinary: Negative for dysuria.  Musculoskeletal: Negative for back pain.  Skin: Negative for rash and wound.  Neurological: Negative for headaches.  Psychiatric/Behavioral: The patient is not nervous/anxious.      Physical Exam Updated Vital Signs BP 125/78   Pulse 92   Temp 97.6 F (36.4 C) (Oral)   Resp 19   Wt (!) 144 kg   SpO2 93%   BMI 59.01 kg/m   Physical Exam Vitals signs and nursing note reviewed.  Constitutional:      General: She is not in acute distress.    Appearance: She is well-developed. She  is obese. She is not diaphoretic.  HENT:     Head: Normocephalic and atraumatic.     Mouth/Throat:     Pharynx: No oropharyngeal exudate.  Eyes:     General: No scleral icterus.       Right eye: No discharge.        Left eye: No discharge.     Conjunctiva/sclera: Conjunctivae normal.     Pupils: Pupils are equal, round, and reactive to light.  Neck:     Musculoskeletal: Normal range of motion and neck supple.     Thyroid: No thyromegaly.  Cardiovascular:     Rate and Rhythm: Regular rhythm. Tachycardia present.     Heart sounds: Normal heart sounds. No murmur. No friction rub. No gallop.   Pulmonary:      Effort: Pulmonary effort is normal. Tachypnea present. No respiratory distress.     Breath sounds: No stridor. Wheezing (expiratory at bilateral bases) and rales (bilateral bases) present.  Abdominal:     General: Bowel sounds are normal. There is no distension.     Palpations: Abdomen is soft.     Tenderness: There is no abdominal tenderness. There is no guarding or rebound.  Musculoskeletal:     Right lower leg: She exhibits no tenderness. No edema.     Left lower leg: She exhibits no tenderness. No edema.  Lymphadenopathy:     Cervical: No cervical adenopathy.  Skin:    General: Skin is warm and dry.     Coloration: Skin is not pale.     Findings: No rash.  Neurological:     Mental Status: She is alert.     Coordination: Coordination normal.      ED Treatments / Results  Labs (all labs ordered are listed, but only abnormal results are displayed) Labs Reviewed  CBC WITH DIFFERENTIAL/PLATELET - Abnormal; Notable for the following components:      Result Value   WBC 15.9 (*)    RBC 5.27 (*)    HCT 49.6 (*)    Neutro Abs 9.6 (*)    Lymphs Abs 4.5 (*)    Abs Immature Granulocytes 0.46 (*)    All other components within normal limits  BASIC METABOLIC PANEL - Abnormal; Notable for the following components:   CO2 14 (*)    Glucose, Bld 371 (*)    Creatinine, Ser 1.28 (*)    Calcium 8.3 (*)    GFR calc non Af Amer 45 (*)    GFR calc Af Amer 53 (*)    Anion gap 18 (*)    All other components within normal limits  BRAIN NATRIURETIC PEPTIDE - Abnormal; Notable for the following components:   B Natriuretic Peptide 463.1 (*)    All other components within normal limits  LACTIC ACID, PLASMA - Abnormal; Notable for the following components:   Lactic Acid, Venous 2.8 (*)    All other components within normal limits  URINALYSIS, ROUTINE W REFLEX MICROSCOPIC - Abnormal; Notable for the following components:   Color, Urine STRAW (*)    Glucose, UA 50 (*)    All other components  within normal limits  CBG MONITORING, ED - Abnormal; Notable for the following components:   Glucose-Capillary 324 (*)    All other components within normal limits  POCT I-STAT EG7 - Abnormal; Notable for the following components:   pH, Ven 7.143 (*)    pO2, Ven 158.0 (*)    Bicarbonate 17.1 (*)    TCO2 19 (*)  Acid-base deficit 12.0 (*)    Calcium, Ion 1.09 (*)    HCT 47.0 (*)    Hemoglobin 16.0 (*)    All other components within normal limits  TROPONIN I  LACTIC ACID, PLASMA  C-REACTIVE PROTEIN  FERRITIN  LACTATE DEHYDROGENASE  HEPATIC FUNCTION PANEL  PROCALCITONIN  I-STAT CREATININE, ED    EKG EKG Interpretation  Date/Time:  Sunday July 28 2018 06:59:33 EDT Ventricular Rate:  125 PR Interval:    QRS Duration: 148 QT Interval:  343 QTC Calculation: 495 R Axis:   -48 Text Interpretation:  Sinus tachycardia Ventricular bigeminy Left bundle branch block No previous tracing Confirmed by Gwyneth Sprout ([id number]) on 07/28/2018 7:03:17 AM   Radiology Dg Chest Portable 1 View  Result Date: 07/28/2018 CLINICAL DATA:  Cough, shortness of breath EXAM: PORTABLE CHEST 1 VIEW COMPARISON:  None. FINDINGS: Increased interstitial markings. Lower lobe predominant/infrahilar opacities, possibly reflecting mild interstitial edema, less likely mild infection. No definite pleural effusions. No pneumothorax. Cardiomegaly. IMPRESSION: Increased interstitial markings with lower lobe/infrahilar opacities, favoring mild interstitial edema, less likely mild infection. Electronically Signed   By: Charline Bills M.D.   On: 07/28/2018 07:36    Procedures Procedures (including critical care time)  Medications Ordered in ED Medications  nitroGLYCERIN 0.2 mg/mL in dextrose 5 % infusion (  Stopped 07/28/18 0829)  furosemide (LASIX) injection 40 mg (40 mg Intravenous Given 07/28/18 0847)     Initial Impression / Assessment and Plan / ED Course  I have reviewed the triage vital signs and the  nursing notes.  Pertinent labs & imaging results that were available during my care of the patient were reviewed by me and considered in my medical decision making (see chart for details).        Patient presenting with suspected pulmonary edema as well as a metabolic acidosis.  Patient is hyperglycemic, which could contribute, however patient having significant tachypnea.  She was placed on BiPAP.  Chest x-ray shows pulmonary edema.  Patient given nitroglycerin drip initially for suspected pulmonary edema and hypertension which significantly improved and drip was discontinued.  Patient also given 40 mg of Lasix and patient urinated 1 L.  She is feeling much better and transitioned off of BiPAP to 4 L nasal cannula.  Patient symptoms were very sudden onset and she has had no fever or preliminary infectious symptoms.  Felt COVID-19 less likely, however will defer to admitting team for further work-up to rule out.  I discussed patient case with Dr. Mahala Menghini with [facility] who accepts patient for admission.  I appreciate his assistance with the patient.  Patient also evaluated by my attending, Dr. Anitra Lauth, who guided the patient's management and agrees with plan.  Final Clinical Impressions(s) / ED Diagnoses   Final diagnoses:  Shortness of breath  Acute pulmonary edema [facility])    ED Discharge Orders    None          Emi Holes, PA-C 07/28/18 1227    Gwyneth Sprout, MD 07/31/18 0830

## 2018-07-28 NOTE — ED Notes (Signed)
Pt placed back on droplet/contact

## 2018-07-28 NOTE — ED Notes (Signed)
Urine culture sent to lab.

## 2018-07-28 NOTE — Progress Notes (Signed)
Pt taken off bipap and placed on 4L [place] per MD request. Pt states she feels much better and denies SOB, no increased WOB, no distress noted. VS within normal limits. RT will continue to monitor for bipap needs

## 2018-07-28 NOTE — ED Notes (Signed)
Pt placed on Airborne/Contact per admitting MD.

## 2018-07-28 NOTE — ED Triage Notes (Signed)
Coming from home. Started cough yesterday and SOB became increasing worse overnight. Afebrile. Upon EMS arrival, 84% on room air. Pt placed on NRB, pulse ox 88%; labored respirations.

## 2018-07-28 NOTE — ED Notes (Signed)
ED TO INPATIENT HANDOFF REPORT  ED Nurse Name and Phone #: Viktor Philipp [phone]  S Name/Age/Gender Kimberly Lyons 58 y.o. female Room/Bed: 017C/017C  Code Status   Code Status: Full Code  Home/SNF/Other Home Patient oriented to: self, place, time and situation Is this baseline? Yes   Triage Complete: Triage complete  Chief Complaint Shortness of breath  Triage Note Coming from home. Started cough yesterday and SOB became increasing worse overnight. Afebrile. Upon EMS arrival, 84% on room air. Pt placed on NRB, pulse ox 88%; labored respirations.    Allergies No Known Allergies  Level of Care/Admitting Diagnosis ED Disposition    ED Disposition Condition Comment   Admit  Hospital Area: [facility] [[id number]]  Level of Care: Telemetry Medical [104]  Diagnosis: Heart failure [facility]) [[id number]]  Admitting Physician: Rhetta Mura [phone]  Attending Physician: Rhetta Mura [phone]  Estimated length of stay: past midnight tomorrow  Certification:: I certify this patient will need inpatient services for at least 2 midnights  Bed request comments: low risk droplet  PT Class (Do Not Modify): Inpatient [101]  PT Acc Code (Do Not Modify): Private [1]       B Medical/Surgery History History reviewed. No pertinent past medical history. History reviewed. No pertinent surgical history.   A IV Location/Drains/Wounds Patient Lines/Drains/Airways Status   Active Line/Drains/Airways    Name:   Placement date:   Placement time:   Site:   Days:   Peripheral IV 07/28/18 Left Hand   07/28/18    0706    Hand   less than 1   Peripheral IV 07/28/18 Right Antecubital   07/28/18    0840    Antecubital   less than 1          Intake/Output Last 24 hours No intake or output data in the 24 hours ending 07/28/18 1503  Labs/Imaging Results for orders placed or performed during the hospital encounter of 07/28/18 (from the past 48 hour(s))  CBC with  Differential/Platelet     Status: Abnormal   Collection Time: 07/28/18  7:01 AM  Result Value Ref Range   WBC 15.9 (H) 4.0 - 10.5 K/uL   RBC 5.27 (H) 3.87 - 5.11 MIL/uL   Hemoglobin 15.0 12.0 - 15.0 g/dL   HCT 46.5 (H) 03.5 - 46.5 %   MCV 94.1 80.0 - 100.0 fL   MCH 28.5 26.0 - 34.0 pg   MCHC 30.2 30.0 - 36.0 g/dL   RDW 68.1 27.5 - 17.0 %   Platelets 353 150 - 400 K/uL   nRBC 0.0 0.0 - 0.2 %   Neutrophils Relative % 59 %   Neutro Abs 9.6 (H) 1.7 - 7.7 K/uL   Lymphocytes Relative 29 %   Lymphs Abs 4.5 (H) 0.7 - 4.0 K/uL   Monocytes Relative 6 %   Monocytes Absolute 0.9 0.1 - 1.0 K/uL   Eosinophils Relative 2 %   Eosinophils Absolute 0.3 0.0 - 0.5 K/uL   Basophils Relative 1 %   Basophils Absolute 0.1 0.0 - 0.1 K/uL   Immature Granulocytes 3 %   Abs Immature Granulocytes 0.46 (H) 0.00 - 0.07 K/uL    Comment: Performed at [facility], [address] [place], [place] [address]  Basic metabolic panel     Status: Abnormal   Collection Time: 07/28/18  7:01 AM  Result Value Ref Range   Sodium 136 135 - 145 mmol/L   Potassium 4.5 3.5 - 5.1 mmol/L  Comment: HEMOLYSIS AT THIS LEVEL MAY AFFECT RESULT   Chloride 104 98 - 111 mmol/L   CO2 14 (L) 22 - 32 mmol/L   Glucose, Bld 371 (H) 70 - 99 mg/dL   BUN 14 6 - 20 mg/dL   Creatinine, Ser 1.61 (H) 0.44 - 1.00 mg/dL   Calcium 8.3 (L) 8.9 - 10.3 mg/dL   GFR calc non Af Amer 45 (L) >60 mL/min   GFR calc Af Amer 53 (L) >60 mL/min   Anion gap 18 (H) 5 - 15    Comment: Performed at [facility], [address] [place], [place] [address]  Brain natriuretic peptide     Status: Abnormal   Collection Time: 07/28/18  7:01 AM  Result Value Ref Range   B Natriuretic Peptide 463.1 (H) 0.0 - 100.0 pg/mL    Comment: Performed at [facility], [address] [place], [place] [address]  Troponin I - ONCE - STAT     Status: None   Collection Time: 07/28/18  7:01 AM  Result Value Ref Range   Troponin I <0.03 <0.03 ng/mL     Comment: Performed at [facility], [address] [place], [place] [address]  I-stat Creatinine, ED     Status: None   Collection Time: 07/28/18  7:21 AM  Result Value Ref Range   Creatinine, Ser 1.00 0.44 - 1.00 mg/dL  POCT I-Stat EG7     Status: Abnormal   Collection Time: 07/28/18  7:21 AM  Result Value Ref Range   pH, Ven 7.143 (LL) 7.250 - 7.430   pCO2, Ven 49.8 44.0 - 60.0 mmHg   pO2, Ven 158.0 (H) 32.0 - 45.0 mmHg   Bicarbonate 17.1 (L) 20.0 - 28.0 mmol/L   TCO2 19 (L) 22 - 32 mmol/L   O2 Saturation 99.0 %   Acid-base deficit 12.0 (H) 0.0 - 2.0 mmol/L   Sodium 138 135 - 145 mmol/L   Potassium 4.3 3.5 - 5.1 mmol/L   Calcium, Ion 1.09 (L) 1.15 - 1.40 mmol/L   HCT 47.0 (H) 36.0 - 46.0 %   Hemoglobin 16.0 (H) 12.0 - 15.0 g/dL   Patient temperature HIDE    Sample type VENOUS    Comment NOTIFIED PHYSICIAN   CBG monitoring, ED     Status: Abnormal   Collection Time: 07/28/18  7:30 AM  Result Value Ref Range   Glucose-Capillary 324 (H) 70 - 99 mg/dL   Comment 1 Notify RN    Comment 2 Document in Chart   Lactic acid, plasma     Status: Abnormal   Collection Time: 07/28/18  8:33 AM  Result Value Ref Range   Lactic Acid, Venous 2.8 (HH) 0.5 - 1.9 mmol/L    Comment: CRITICAL RESULT CALLED TO, READ BACK BY AND VERIFIED WITH: C GROCE,RN AT 0907 07/28/2018 BY L BENFIELD Performed at [facility], [address], [place], [place] [address]   Urinalysis, Routine w reflex microscopic     Status: Abnormal   Collection Time: 07/28/18 11:08 AM  Result Value Ref Range   Color, Urine STRAW (A) YELLOW   APPearance CLEAR CLEAR   Specific Gravity, Urine 1.006 1.005 - 1.030   pH 5.0 5.0 - 8.0   Glucose, UA 50 (A) NEGATIVE mg/dL   Hgb urine dipstick NEGATIVE NEGATIVE   Bilirubin Urine NEGATIVE NEGATIVE   Ketones, ur NEGATIVE NEGATIVE mg/dL   Protein, ur NEGATIVE NEGATIVE mg/dL   Nitrite NEGATIVE NEGATIVE  Leukocytes,Ua NEGATIVE NEGATIVE    Comment: Performed at [facility], [address], [place], [place] [address]  Lactic acid, plasma     Status: None   Collection Time: 07/28/18 12:31 PM  Result Value Ref Range   Lactic Acid, Venous 1.8 0.5 - 1.9 mmol/L    Comment: Performed at [facility], [address], [place], [place] [address]  C-reactive protein     Status: Abnormal   Collection Time: 07/28/18 12:31 PM  Result Value Ref Range   CRP 1.4 (H) <1.0 mg/dL    Comment: Performed at [facility], [address] [place], [place] [address]  Ferritin     Status: None   Collection Time: 07/28/18 12:31 PM  Result Value Ref Range   Ferritin 63 11 - 307 ng/mL    Comment: Performed at [facility], [address], [place], [place] [address]  Lactate dehydrogenase     Status: Abnormal   Collection Time: 07/28/18 12:31 PM  Result Value Ref Range   LDH 231 (H) 98 - 192 U/L    Comment: Performed at [facility], [address], [place], [place] [address]  Hepatic function panel     Status: Abnormal   Collection Time: 07/28/18 12:31 PM  Result Value Ref Range   Total Protein 7.0 6.5 - 8.1 g/dL   Albumin 3.5 3.5 - 5.0 g/dL   AST 59 (H) 15 - 41 U/L   ALT 65 (H) 0 - 44 U/L   Alkaline Phosphatase 100 38 - 126 U/L   Total Bilirubin 0.7 0.3 - 1.2 mg/dL   Bilirubin, Direct 0.2 0.0 - 0.2 mg/dL   Indirect Bilirubin 0.5 0.3 - 0.9 mg/dL    Comment: Performed at [facility], [address] [place], [place] [address]  Procalcitonin - Baseline     Status: None   Collection Time: 07/28/18 12:31 PM  Result Value Ref Range   Procalcitonin 0.60 ng/mL    Comment:        Interpretation: PCT > 0.5 ng/mL and <= 2 ng/mL: Systemic infection (sepsis) is possible, but other conditions are known to elevate PCT as well. (NOTE)       Sepsis PCT Algorithm           Lower Respiratory Tract                                      Infection PCT Algorithm    ----------------------------     ----------------------------         PCT < 0.25 ng/mL                 PCT < 0.10 ng/mL         Strongly encourage             Strongly discourage   discontinuation of antibiotics    initiation of antibiotics    ----------------------------     -----------------------------       PCT 0.25 - 0.50 ng/mL            PCT 0.10 - 0.25 ng/mL               OR       >80% decrease in PCT            Discourage initiation of  antibiotics      Encourage discontinuation           of antibiotics    ----------------------------     -----------------------------         PCT >= 0.50 ng/mL              PCT 0.26 - 0.50 ng/mL                AND       <80% decrease in PCT             Encourage initiation of                                             antibiotics       Encourage continuation           of antibiotics    ----------------------------     -----------------------------        PCT >= 0.50 ng/mL                  PCT > 0.50 ng/mL               AND         increase in PCT                  Strongly encourage                                      initiation of antibiotics    Strongly encourage escalation           of antibiotics                                     -----------------------------                                           PCT <= 0.25 ng/mL                                                 OR                                        > 80% decrease in PCT                                     Discontinue / Do not initiate                                             antibiotics Performed at [facility], [address] [place], [place] [address]   CBG monitoring, ED     Status: Abnormal   Collection Time: 07/28/18  12:33 PM  Result Value Ref Range   Glucose-Capillary 125 (H) 70 - 99 mg/dL   Dg Chest Portable 1 View  Result Date: 07/28/2018 CLINICAL DATA:  Cough, shortness of breath EXAM: PORTABLE CHEST 1 VIEW COMPARISON:  None. FINDINGS: Increased interstitial markings. Lower lobe  predominant/infrahilar opacities, possibly reflecting mild interstitial edema, less likely mild infection. No definite pleural effusions. No pneumothorax. Cardiomegaly. IMPRESSION: Increased interstitial markings with lower lobe/infrahilar opacities, favoring mild interstitial edema, less likely mild infection. Electronically Signed   By: Charline Bills M.D.   On: 07/28/2018 07:36    Pending Labs Unresulted Labs (From admission, onward)    Start     Ordered   08/04/18 0500  Creatinine, serum  (enoxaparin (LOVENOX)    CrCl < 30 ml/min)  Weekly,   R    Comments:  while on enoxaparin therapy.    07/28/18 1450   07/29/18 0500  Basic metabolic panel  Daily,   R     07/28/18 1450   07/28/18 1447  CBC  (enoxaparin (LOVENOX)    CrCl < 30 ml/min)  Once,   R    Comments:  Baseline for enoxaparin therapy IF NOT ALREADY DRAWN.  Notify MD if PLT < 100 K.    07/28/18 1450   07/28/18 1447  Creatinine, serum  (enoxaparin (LOVENOX)    CrCl < 30 ml/min)  Once,   R    Comments:  Baseline for enoxaparin therapy IF NOT ALREADY DRAWN.    07/28/18 1450   07/28/18 1445  HIV antibody (Routine Testing)  Once,   R     07/28/18 1450   07/28/18 1443  Hemoglobin A1c  ONCE - STAT,   R     07/28/18 1450   07/28/18 1400  Novel Coronavirus, NAA (hospital order; send-out to ref lab)  (Novel Coronavirus, NAA [facility] Order; send-out to ref lab) with precautions panel)  ONCE - STAT,   R    Question Answer Comment  Current symptoms Fever and Cough   Excluded other viral illnesses Yes   Exposure Risk None   Patient immune status Normal      07/28/18 1359          Vitals/Pain Today's Vitals   07/28/18 1400 07/28/18 1415 07/28/18 1430 07/28/18 1500  BP: 124/82 133/80 132/85 133/84  Pulse: 89 88 88 83  Resp: (!) 22 19  19   Temp:      TempSrc:      SpO2: 94% 93% 95% 94%  Weight:      PainSc:        Isolation Precautions Droplet and Contact precautions  Medications Medications  insulin aspart  (novoLOG) injection 0-9 Units (has no administration in time range)  insulin aspart (novoLOG) injection 3 Units (has no administration in time range)  sodium chloride flush (NS) 0.9 % injection 3 mL (has no administration in time range)  sodium chloride flush (NS) 0.9 % injection 3 mL (has no administration in time range)  0.9 %  sodium chloride infusion (has no administration in time range)  acetaminophen (TYLENOL) tablet 650 mg (has no administration in time range)  ondansetron (ZOFRAN) injection 4 mg (has no administration in time range)  enoxaparin (LOVENOX) injection 30 mg (has no administration in time range)  furosemide (LASIX) injection 40 mg (has no administration in time range)  isosorbide mononitrate (IMDUR) 24 hr tablet 15 mg (has no administration in time range)  hydrALAZINE (APRESOLINE) tablet 10 mg (has no administration in time range)  nitroGLYCERIN 0.2 mg/mL in dextrose 5 % infusion (  Stopped 07/28/18 0829)  furosemide (LASIX) injection 40 mg (40 mg Intravenous Given 07/28/18 0847)    Mobility walks Low fall risk   Focused Assessments Pulmonary Assessment Handoff:  Lung sounds: Bilateral Breath Sounds: Diminished L Breath Sounds: Diminished R Breath Sounds: Diminished O2 Device: Room Air O2 Flow Rate (L/min): 10 L/min      R Recommendations: See Admitting Provider Note  Report given to:   Additional Notes:  covid r/o

## 2018-07-29 ENCOUNTER — Inpatient Hospital Stay (HOSPITAL_COMMUNITY): Payer: Managed Care, Other (non HMO)

## 2018-07-29 DIAGNOSIS — I5031 Acute diastolic (congestive) heart failure: Secondary | ICD-10-CM

## 2018-07-29 DIAGNOSIS — I10 Essential (primary) hypertension: Secondary | ICD-10-CM

## 2018-07-29 LAB — GLUCOSE, CAPILLARY
Glucose-Capillary: 132 mg/dL — ABNORMAL HIGH (ref 70–99)
Glucose-Capillary: 146 mg/dL — ABNORMAL HIGH (ref 70–99)
Glucose-Capillary: 105 mg/dL — ABNORMAL HIGH (ref 70–99)
Glucose-Capillary: 131 mg/dL — ABNORMAL HIGH (ref 70–99)

## 2018-07-29 LAB — HIV ANTIBODY (ROUTINE TESTING W REFLEX): HIV Screen 4th Generation wRfx: NONREACTIVE

## 2018-07-29 MED ORDER — LISINOPRIL 10 MG PO TABS
10.0000 mg | ORAL_TABLET | Freq: Every day | ORAL | Status: DC
  Administered 2018-07-29: 10 mg via ORAL
  Filled 2018-07-29 (×2): qty 1

## 2018-07-29 NOTE — Progress Notes (Signed)
Pt just informed this nurse that on this past Saturday that she was using roundup in her yard and it was after that when she became very sob with coughing.  Pt asjed for this message to be placed in the computer.

## 2018-07-29 NOTE — TOC Initial Note (Addendum)
Transition of Care [facility]) - Initial/Assessment Note    Patient Details  Name: Kimberly Lyons MRN: [id number] Date of Birth: 02-17-1961  Transition of Care [facility]) CM/SW Contact:    Leone Haven, RN Phone Number: 07/29/2018, 11:59 AM  Clinical Narrative:                 From home alone, CHF, she [facility] to [facility] for [facility], she is indep pta, works from home, preferred pharmacy is  [facility] on [address], she has no problem getting her medications, she is for rule out covid 19.  She is not sure if he wants a HHRN for CHF disease management , NCM will check back with her later today.  She will need a cab voucher at discharge.  She does not want HHRN, she would like to have information for her diet from the nutritionist,  NCM contacted Florentina Addison, she will contact patient and get information to her ,to help her with her diet.  Expected Discharge Plan: Home/Self Care Barriers to Discharge: No Barriers Identified   Patient Goals and CMS Choice Patient states their goals for this hospitalization and ongoing recovery are:: to change her eating habits all the way around   Choice offered to / list presented to : NA  Expected Discharge Plan and Services Expected Discharge Plan: Home/Self Care In-house Referral: NA Discharge Planning Services: CM Consult Post Acute Care Choice: NA Living arrangements for the past 2 months: Single Family Home                 DME Arranged: N/A DME Agency: NA HH Arranged: (patient not sure if she wants yet, will check back with her later) [facility] Agency: NA  Prior Living Arrangements/Services Living arrangements for the past 2 months: Single Family Home Lives with:: Self   Do you feel safe going back to the place where you live?: Yes               Activities of Daily Living Home Assistive Devices/Equipment: None ADL Screening (condition at time of admission) Patient's cognitive ability adequate to safely complete daily activities?: Yes Is the  patient deaf or have difficulty hearing?: No Does the patient have difficulty seeing, even when wearing glasses/contacts?: No Does the patient have difficulty concentrating, remembering, or making decisions?: No Patient able to express need for assistance with ADLs?: Yes Does the patient have difficulty dressing or bathing?: No Independently performs ADLs?: Yes (appropriate for developmental age) Does the patient have difficulty walking or climbing stairs?: No Weakness of Legs: Both Weakness of Arms/Hands: None  Permission Sought/Granted                  Emotional Assessment   Attitude/Demeanor/Rapport: Engaged Affect (typically observed): Appropriate Orientation: : Oriented to Self, Oriented to Place, Oriented to  Time, Oriented to Situation   Psych Involvement: No (comment)  Admission diagnosis:  Shortness of breath [R06.02] Acute pulmonary edema (HCC) [J81.0] SOB (shortness of breath) [R06.02] Heart failure (HCC) [I50.9] Patient Active Problem List   Diagnosis Date Noted  . Heart failure (HCC) 07/28/2018  . Acute heart failure (HCC) 07/28/2018   PCP:  Patient, No Pcp Per Pharmacy:   [id number] [place] Otto, [place] - [address] AT [facility] [address] [place] [address] Phone: [phone] Fax: [phone]     Social Determinants of Health (SDOH) Interventions    Readmission Risk Interventions Readmission Risk Prevention Plan  07/29/2018  Medication Screening Complete  Some recent data might be hidden

## 2018-07-29 NOTE — Progress Notes (Addendum)
Nutrition Consult/Education Note  RD working remotely  RD paged per Gavin Pound, NCM for CHF diet education per pt request. Spoke with patient over room phone.  Reviewed patient's dietary recall. Provided examples on ways to decrease sodium intake in diet. Discouraged intake of processed foods and use of salt shaker. Encouraged fresh fruits and vegetables as well as whole grain sources of carbohydrates to maximize fiber intake.   Teach back method used.  Expect good compliance.  Spoke with Minna Antis, RN; talked her through printing out pt "Low Sodium Nutrition Therapy" handouts from the [employer] of [employer].   Body mass index is 43.43 kg/m. Pt meets criteria for Obesity Class III based on current BMI.  Current diet order is Heart Healthy/Carbohydrate Modified. Labs and medications reviewed. No further nutrition interventions warranted at this time. RD contact information provided.   If additional nutrition issues arise, please re-consult RD.   Maureen Chatters, RD, LDN Pager #: [phone] After-Hours Pager #: [phone]

## 2018-07-29 NOTE — Progress Notes (Signed)
PROGRESS NOTE    Kimberly Lyons  TUU:[id number] DOB: [birth year] DOA: 07/28/2018 PCP: Patient, No Pcp Per    Brief Narrative:  58 year old female who presented with dyspnea.  She does not have any significant past medical history.  She reported progressive dyspnea, positive orthopnea and cough.  On her initial physical examination his oxygen saturation was 84% on room air, her temperature was 97.9, blood pressure 220/124, heart rate 120, respirate rate 35.  Heart S1-S2 present and rhythmic, her lungs were clear to auscultation bilaterally, abdomen was soft and no lower extremity edema.  Sodium 136, potassium 4.5, chloride 104, bicarb 14, glucose 371, BUN 14, creatinine 1.28, BNP 463, troponin less than 0.03, white count 15.9, hemoglobin 15.0, hematocrit 49.6, platelets 353.  Her procalcitonin was 0.6.  Urinalysis was negative for infection.  Her chest x-ray had bilateral interstitial infiltrates at bases, cephalization of the vasculature.  Her EKG had 125 bpm, sinus rhythm, left axis deviation with a left bundle branch block, positive PVCs/bigeminy, lateral ST segment depression and lateral T wave versions, poor r wave progression.   Patient was admitted to the hospital working diagnosis of acute hypoxic respiratory failure due to cardiogenic pulmonary edema complicated by hypertensive emergency.   Patient under investigation for COVID 19.   Assessment & Plan:   Active Problems:   Heart failure (HCC)   Acute heart failure (HCC)   1. Acute hypoxic respiratory failure due to acute cardiogenic pulmonary edema. Patient has responded will to diuresis, urine output 1000 ml, over last 24 H, will continue furosemide 40 mg IV q12, trace lower extremity edema, will follow on echocardiogram. Improved blood pressure, will plan for low dose coreg at discharge.   2. Uncontrolled HTN. Blood pressure has improved, will continue blood pressure monitoring. Will start patient on low dose ace inh, with lisinopril  10 mg daily.   3. AKI. Improved renal function with serum cr today at 0,97 with K at 3,8, will follow on renal panel in am, avoid hypotension and nephrotoxic medications.   4. Prediabetes. Hb A1c is 6, will discontinue glucose cover with insulin for now, her fasting glucose is 112 this am. Will plan to add metformin at discharge.   5. Morbid obesity. Calculated BMI 43,4. Will need follow up as outpatient.   DVT prophylaxis: enoxaparin   Code Status: full Family Communication: pending clinical improvement  Disposition Plan/ discharge barriers: pending clinical improvement  Body mass index is 43.43 kg/m. Malnutrition Type:      Malnutrition Characteristics:      Nutrition Interventions:     RN Pressure Injury Documentation:     Consultants:     Procedures:     Antimicrobials:       Subjective: Patient is feeling better, close to her baseline, no nausea or vomiting, no chest pain. No fever or chills.   Objective: Vitals:   07/29/18 0100 07/29/18 0508 07/29/18 0548 07/29/18 0757  BP:  (!) 141/89  136/85  Pulse:  79  78  Resp:    16  Temp:  98.2 F (36.8 C)  97.9 F (36.6 C)  TempSrc:    Oral  SpO2: 95%   93%  Weight:   133.4 kg   Height:        Intake/Output Summary (Last 24 hours) at 07/29/2018 1227 Last data filed at 07/29/2018 0553 Gross per 24 hour  Intake 500 ml  Output 1000 ml  Net -500 ml   Filed Weights   07/28/18 0702 07/28/18 1700 07/29/18  0548  Weight: (!) 144 kg 135.5 kg 133.4 kg    Examination:   General: not in pain or dyspnea Neurology: Awake and alert, non focal  E ENT: mild pallor, no icterus, oral mucosa moist Cardiovascular: No JVD. S1-S2 present, rhythmic, no gallops, rubs, or murmurs. Trace + bilateral  lower extremity edema. Pulmonary: postive breath sounds bilaterally, adequate air movement, no wheezing, or rhonchi, mild bibasilar rales. Gastrointestinal. Abdomen with no organomegaly, non tender, no rebound or  guarding Skin. No rashes Musculoskeletal: no joint deformities     Data Reviewed: I have personally reviewed following labs and imaging studies  CBC: Recent Labs  Lab 07/28/18 0701 07/28/18 0721 07/28/18 1837  WBC 15.9*  --  14.1*  NEUTROABS 9.6*  --   --   HGB 15.0 16.0* 14.3  HCT 49.6* 47.0* 44.8  MCV 94.1  --  88.9  PLT 353  --  288   Basic Metabolic Panel: Recent Labs  Lab 07/28/18 0701 07/28/18 0721 07/28/18 1703  NA 136 138 140  K 4.5 4.3 3.8  CL 104  --  108  CO2 14*  --  22  GLUCOSE 371*  --  112*  BUN 14  --  13  CREATININE 1.28* 1.00 0.97  CALCIUM 8.3*  --  9.1   GFR: Estimated Creatinine Clearance: 90.6 mL/min (by C-G formula based on SCr of 0.97 mg/dL). Liver Function Tests: Recent Labs  Lab 07/28/18 1231  AST 59*  ALT 65*  ALKPHOS 100  BILITOT 0.7  PROT 7.0  ALBUMIN 3.5   No results for input(s): LIPASE, AMYLASE in the last 168 hours. No results for input(s): AMMONIA in the last 168 hours. Coagulation Profile: No results for input(s): INR, PROTIME in the last 168 hours. Cardiac Enzymes: Recent Labs  Lab 07/28/18 0701  TROPONINI <0.03   BNP (last 3 results) No results for input(s): PROBNP in the last 8760 hours. HbA1C: Recent Labs    07/28/18 1646  HGBA1C 6.1*   CBG: Recent Labs  Lab 07/28/18 0730 07/28/18 1233 07/28/18 2056 07/29/18 0755 07/29/18 1222  GLUCAP 324* 125* 105* 132* 146*   Lipid Profile: No results for input(s): CHOL, HDL, LDLCALC, TRIG, CHOLHDL, LDLDIRECT in the last 72 hours. Thyroid Function Tests: No results for input(s): TSH, T4TOTAL, FREET4, T3FREE, THYROIDAB in the last 72 hours. Anemia Panel: Recent Labs    07/28/18 1231  FERRITIN 63      Radiology Studies: I have reviewed all of the imaging during this hospital visit personally     Scheduled Meds: . enoxaparin (LOVENOX) injection  70 mg Subcutaneous Q24H  . furosemide  40 mg Intravenous Q12H  . hydrALAZINE  10 mg Oral Q8H  . insulin  aspart  0-9 Units Subcutaneous TID WC  . insulin aspart  3 Units Subcutaneous TID WC  . isosorbide mononitrate  15 mg Oral Daily  . sodium chloride flush  3 mL Intravenous Q12H   Continuous Infusions: . sodium chloride       LOS: 1 day        Asa Fath Annett Gula, MD

## 2018-07-30 LAB — GLUCOSE, CAPILLARY: Glucose-Capillary: 125 mg/dL — ABNORMAL HIGH (ref 70–99)

## 2018-07-30 LAB — BASIC METABOLIC PANEL
Anion gap: 7 (ref 5–15)
BUN: 25 mg/dL — ABNORMAL HIGH (ref 6–20)
CO2: 28 mmol/L (ref 22–32)
CREATININE: 1.09 mg/dL — AB (ref 0.44–1.00)
Calcium: 8.8 mg/dL — ABNORMAL LOW (ref 8.9–10.3)
Chloride: 103 mmol/L (ref 98–111)
GFR calc Af Amer: >60 mL/min (ref >60)
GFR calc non Af Amer: 55 mL/min — ABNORMAL LOW (ref >60)
Glucose, Bld: 128 mg/dL — ABNORMAL HIGH (ref 70–99)
Potassium: 3.3 mmol/L — ABNORMAL LOW (ref 3.5–5.1)
Sodium: 138 mmol/L (ref 135–145)

## 2018-07-30 MED ORDER — FUROSEMIDE 20 MG PO TABS: 20.0000 mg | ORAL_TABLET | Freq: Every day | ORAL | Status: DC

## 2018-07-30 MED ORDER — POTASSIUM CHLORIDE CRYS ER 10 MEQ PO TBCR: 10.0000 meq | EXTENDED_RELEASE_TABLET | Freq: Every day | ORAL | 0 refills | Status: DC

## 2018-07-30 MED ORDER — CARVEDILOL 3.125 MG PO TABS: 3.1250 mg | ORAL_TABLET | Freq: Two times a day (BID) | ORAL | 0 refills | Status: DC

## 2018-07-30 MED ORDER — FUROSEMIDE 20 MG PO TABS: 20.0000 mg | ORAL_TABLET | Freq: Every day | ORAL | 0 refills | Status: DC

## 2018-07-30 MED ORDER — POTASSIUM CHLORIDE CRYS ER 20 MEQ PO TBCR: 20.0000 meq | EXTENDED_RELEASE_TABLET | Freq: Every day | ORAL | Status: DC

## 2018-07-30 MED ORDER — POTASSIUM CHLORIDE CRYS ER 20 MEQ PO TBCR: 20.0000 meq | EXTENDED_RELEASE_TABLET | Freq: Every day | ORAL | 0 refills | Status: DC

## 2018-07-30 MED ORDER — POTASSIUM CHLORIDE CRYS ER 20 MEQ PO TBCR: 40.0000 meq | EXTENDED_RELEASE_TABLET | Freq: Once | ORAL | Status: DC

## 2018-07-30 MED ORDER — CARVEDILOL 3.125 MG PO TABS
3.1250 mg | ORAL_TABLET | Freq: Two times a day (BID) | ORAL | Status: DC
  Administered 2018-07-30: 3.125 mg via ORAL
  Filled 2018-07-30: qty 1

## 2018-07-30 NOTE — Discharge Summary (Addendum)
Physician Discharge Summary  Kimberly Lyons FBP:[id number] DOB: 02/19/1961 DOA: 07/28/2018  PCP: Patient, No Pcp Per  Admit date: 07/28/2018 Discharge date: 07/30/2018  Admitted From: Home  Disposition:  Home   Recommendations for Outpatient Follow-up and new medication changes:  1. Follow up with Primary Care in 7 days.  2. Patient has been placed on carvedilol  3. Follow up with cardiology as outpatient for echocardiogram, if COVID negative may start patient on ace inh or arb. 4. Patient has bee advised about life style modifications.  5. Patient will take furosemide daily along with potassium supplements.  6. COVID test negative.   Home Health: no  Equipment/Devices: no    Discharge Condition: stable  CODE STATUS:full  Diet recommendation: heart healthy and diabetic prudent.   Brief/Interim Summary: 58 year old female who presented with dyspnea.  She does not have any significant past medical history.  She reported progressive dyspnea, positive orthopnea and cough.  On her initial physical examination his oxygen saturation was 84% on room air, her temperature was 97.9, blood pressure 220/124, heart rate 120, respirate rate 35.  Heart S1-S2 present and rhythmic, her lungs were clear to auscultation bilaterally, abdomen was soft and no lower extremity edema.  Sodium 136, potassium 4.5, chloride 104, bicarb 14, glucose 371, BUN 14, creatinine 1.28, BNP 463, troponin less than 0.03, white count 15.9, hemoglobin 15.0, hematocrit 49.6, platelets 353.  Her procalcitonin was 0.6.  Urinalysis was negative for infection.  Her chest x-ray had bilateral interstitial infiltrates at bases, cephalization of the vasculature.  Her EKG had 125 bpm, sinus rhythm, left axis deviation with a left bundle branch block, positive PVCs/bigeminy, lateral ST segment depression and lateral T wave versions, poor r wave progression.   Patient was admitted to the hospital working diagnosis of acute hypoxic respiratory  failure due to cardiogenic pulmonary edema complicated by hypertensive emergency.   1.  Acute hypoxic respiratory failure due to acute cardiogenic pulmonary edema.  Patient was admitted to the medical ward, she was placed on a remote telemetry monitor, received aggressive diuresis with IV furosemide, and negative fluid balance was achieved (negative 2000 ml since admission) with improvement of her symptoms.  Patient will be discharged on carvedilol 3.125 mg twice daily.  Echocardiogram was not performed due to COVID restrictions, chart was reviewed by the cardiology team, recommendations for outpatient echocardiography.  Patient will be discharged on daily furosemide 20 mg daily to keep negative fluid balance.  2.  Hypertensive emergency, uncontrolled hypertension.  Patient responded well to diuresis, patient will continue blood pressure control with carvedilol.  She may benefit from ACE inhibitors/ ARB considering her heart failure diagnosis, but currently will wait for COVID testing to be negative before starting this agent.  3. AKI.  Kidney function improved with diuresis, her discharge creatinine is 1.0, potassium 3.3, serum bicarbonate of 28.   4.  Prediabetes with acute hyperglycemia.  Hemoglobin A1c was 6.1, patient was advised about lifestyle modifications.  Her discharge am glucose is 128, she will need close follow-up as an outpatient.  5.  Morbid obesity.  Calculated BMI 43.4, follow-up as an outpatient.  Discharge Diagnoses:  Active Problems:   Heart failure (HCC)   Acute heart failure (HCC)    Discharge Instructions   Allergies as of 07/30/2018   No Known Allergies     Medication List    STOP taking these medications   azithromycin 250 MG tablet Commonly known as:  ZITHROMAX   chlorpheniramine-HYDROcodone 10-8 MG/5ML Lqcr Commonly known  as:  Tussionex Pennkinetic ER   Guaifenesin 1200 MG Tb12 Commonly known as:  Mucinex Maximum Strength     TAKE these medications    carvedilol 3.125 MG tablet Commonly known as:  COREG Take 1 tablet (3.125 mg total) by mouth 2 (two) times daily with a meal for 30 days.   furosemide 20 MG tablet Commonly known as:  LASIX Take 1 tablet (20 mg total) by mouth daily for 30 days. Start taking on:  July 31, 2018   potassium chloride 10 MEQ tablet Commonly known as:  K-DUR,KLOR-CON Take 1 tablet (10 mEq total) by mouth daily for 30 days. Start taking on:  July 31, 2018      Follow-up Information    [facility] Follow up.   Why:  please make follow up apt Contact information: [address] [place] [address] [phone]         No Known Allergies  Consultations:     Procedures/Studies: Dg Chest Port 1 View  Result Date: 07/29/2018 CLINICAL DATA:  Shortness of breath since Saturday night EXAM: PORTABLE CHEST 1 VIEW COMPARISON:  Portable exam 0837 hours compared to 07/28/2018 FINDINGS: Borderline enlargement of cardiac silhouette. Mediastinal contours and pulmonary vascularity normal. Improved interstitial infiltrates/edema. Mild bibasilar atelectasis. Upper lungs clear. No pleural effusion or pneumothorax. IMPRESSION: Improved pulmonary edema. Mild bibasilar atelectasis. Electronically Signed   By: Ulyses Southward M.D.   On: 07/29/2018 09:08   Dg Chest Portable 1 View  Result Date: 07/28/2018 CLINICAL DATA:  Cough, shortness of breath EXAM: PORTABLE CHEST 1 VIEW COMPARISON:  None. FINDINGS: Increased interstitial markings. Lower lobe predominant/infrahilar opacities, possibly reflecting mild interstitial edema, less likely mild infection. No definite pleural effusions. No pneumothorax. Cardiomegaly. IMPRESSION: Increased interstitial markings with lower lobe/infrahilar opacities, favoring mild interstitial edema, less likely mild infection. Electronically Signed   By: Charline Bills M.D.   On: 07/28/2018 07:36       Subjective: Patient is feeling well, back to her  baseline, no chest pain, no dyspnea, no pnd or orthopnea.   Discharge Exam: Vitals:   07/29/18 2011 07/30/18 0754  BP: 119/66 121/74  Pulse: 76   Resp:    Temp: 98.2 F (36.8 C) (!) 96.8 F (36 C)  SpO2: 95% 95%   Vitals:   07/29/18 1231 07/29/18 1536 07/29/18 2011 07/30/18 0754  BP: 130/81 (!) 142/90 119/66 121/74  Pulse: 85  76   Resp: 16     Temp: (!) 97.3 F (36.3 C) (!) 97.5 F (36.4 C) 98.2 F (36.8 C) (!) 96.8 F (36 C)  TempSrc: Oral Oral Oral Oral  SpO2: 96%  95% 95%  Weight:      Height:        General: Not in pain or dyspnea Neurology: Awake and alert, non focal  E ENT: no pallor, no icterus, oral mucosa moist Cardiovascular: No JVD. S1-S2 present, rhythmic, no gallops, rubs, or murmurs. No lower extremity edema. Pulmonary: positive breath sounds bilaterally, adequate air movement, no wheezing, rhonchi or rales. Gastrointestinal. Abdomen with, no organomegaly, non tender, no rebound or guarding Skin. No rashes Musculoskeletal: no joint deformities   The results of significant diagnostics from this hospitalization (including imaging, microbiology, ancillary and laboratory) are listed below for reference.     Microbiology: No results found for this or any previous visit (from the past 240 hour(s)).   Labs: BNP (last 3 results) Recent Labs    07/28/18 0701  BNP 463.1*   Basic Metabolic  Panel: Recent Labs  Lab 07/28/18 0701 07/28/18 0721 07/28/18 1703 07/30/18 0411  NA 136 138 140 138  K 4.5 4.3 3.8 3.3*  CL 104  --  108 103  CO2 14*  --  22 28  GLUCOSE 371*  --  112* 128*  BUN 14  --  13 25*  CREATININE 1.28* 1.00 0.97 1.09*  CALCIUM 8.3*  --  9.1 8.8*   Liver Function Tests: Recent Labs  Lab 07/28/18 1231  AST 59*  ALT 65*  ALKPHOS 100  BILITOT 0.7  PROT 7.0  ALBUMIN 3.5   No results for input(s): LIPASE, AMYLASE in the last 168 hours. No results for input(s): AMMONIA in the last 168 hours. CBC: Recent Labs  Lab  07/28/18 0701 07/28/18 0721 07/28/18 1837  WBC 15.9*  --  14.1*  NEUTROABS 9.6*  --   --   HGB 15.0 16.0* 14.3  HCT 49.6* 47.0* 44.8  MCV 94.1  --  88.9  PLT 353  --  288   Cardiac Enzymes: Recent Labs  Lab 07/28/18 0701  TROPONINI <0.03   BNP: Invalid input(s): POCBNP CBG: Recent Labs  Lab 07/29/18 0755 07/29/18 1222 07/29/18 1701 07/29/18 2007 07/30/18 0753  GLUCAP 132* 146* 105* 131* 125*   D-Dimer No results for input(s): DDIMER in the last 72 hours. Hgb A1c Recent Labs    07/28/18 1646  HGBA1C 6.1*   Lipid Profile No results for input(s): CHOL, HDL, LDLCALC, TRIG, CHOLHDL, LDLDIRECT in the last 72 hours. Thyroid function studies No results for input(s): TSH, T4TOTAL, T3FREE, THYROIDAB in the last 72 hours.  Invalid input(s): FREET3 Anemia work up Recent Labs    07/28/18 1231  FERRITIN 63   Urinalysis    Component Value Date/Time   COLORURINE STRAW (A) 07/28/2018 1108   APPEARANCEUR CLEAR 07/28/2018 1108   LABSPEC 1.006 07/28/2018 1108   PHURINE 5.0 07/28/2018 1108   GLUCOSEU 50 (A) 07/28/2018 1108   HGBUR NEGATIVE 07/28/2018 1108   BILIRUBINUR NEGATIVE 07/28/2018 1108   KETONESUR NEGATIVE 07/28/2018 1108   PROTEINUR NEGATIVE 07/28/2018 1108   NITRITE NEGATIVE 07/28/2018 1108   LEUKOCYTESUR NEGATIVE 07/28/2018 1108   Sepsis Labs Invalid input(s): PROCALCITONIN,  WBC,  LACTICIDVEN Microbiology No results found for this or any previous visit (from the past 240 hour(s)).   Time coordinating discharge: 45 minutes  SIGNED:   Coralie Keens, MD  [facility] 07/30/2018, 8:12 AM

## 2018-07-30 NOTE — Progress Notes (Addendum)
Telemetry notified nurse pt had wide QRS complexes.  EKG performed and on-call physician paged.

## 2018-07-31 LAB — NOVEL CORONAVIRUS, NAA (HOSP ORDER, SEND-OUT TO REF LAB; TAT 18-24 HRS): SARS-CoV-2, NAA: NOT DETECTED

## 2018-08-13 ENCOUNTER — Telehealth: Payer: Self-pay

## 2018-08-13 NOTE — Telephone Encounter (Signed)
Web ex is fine.  thanks

## 2018-08-13 NOTE — Telephone Encounter (Signed)
Called pt to confirm email address to send webex link, pt advises the appt was change for webex to office visit.  I advised pt she was on my list to schedule webex but I would confer with dr. Leretha Pol as to whether or not her appt on 08/26/2018 hospital f/u SO[facility] should be in person or webex.  Pt also advises she needs labs drawn and meds refilled as she will be running out the day of visit.  I did send webex link anyway but advised would contact her with definite answer back.  I will be off on Wednesday so I will send note to Anitra as well to f/u with pt.   Dr. Leretha Pol please clarify and if labs need to be ordered for pt and these be put in for future and nurse visit scheduled for pt to come in before visit so labs will be back before appt. Dgaddy, CMA

## 2018-08-16 NOTE — Telephone Encounter (Signed)
Tried calling pt no answer will try again later.  Calling pt to advise dr Leretha Pol says her webex visit is fine for f/u sob. Dgaddy, CMA

## 2018-08-23 NOTE — Telephone Encounter (Signed)
Tried contacting pt again to advised per santiago webex visit is fine, no answer and no voicemail.  Pt appt is this upcoming Monday 08/26/18

## 2018-08-26 ENCOUNTER — Telehealth: Payer: Self-pay | Admitting: Family Medicine

## 2018-08-26 ENCOUNTER — Ambulatory Visit (INDEPENDENT_AMBULATORY_CARE_PROVIDER_SITE_OTHER): Payer: Self-pay | Admitting: Family Medicine

## 2018-08-26 ENCOUNTER — Telehealth: Payer: Self-pay | Admitting: Cardiology

## 2018-08-26 ENCOUNTER — Other Ambulatory Visit: Payer: Self-pay

## 2018-08-26 VITALS — Ht 69.0 in | Wt 280.2 lb

## 2018-08-26 DIAGNOSIS — I509 Heart failure, unspecified: Secondary | ICD-10-CM

## 2018-08-26 DIAGNOSIS — R7303 Prediabetes: Secondary | ICD-10-CM | POA: Insufficient documentation

## 2018-08-26 DIAGNOSIS — Z6841 Body Mass Index (BMI) 40.0 and over, adult: Secondary | ICD-10-CM

## 2018-08-26 MED ORDER — FUROSEMIDE 20 MG PO TABS: 20.0000 mg | ORAL_TABLET | Freq: Every day | ORAL | 5 refills | Status: DC

## 2018-08-26 MED ORDER — POTASSIUM CHLORIDE CRYS ER 10 MEQ PO TBCR: 10.0000 meq | EXTENDED_RELEASE_TABLET | Freq: Every day | ORAL | 5 refills | Status: DC

## 2018-08-26 MED ORDER — CARVEDILOL 3.125 MG PO TABS: 3.1250 mg | ORAL_TABLET | Freq: Two times a day (BID) | ORAL | 5 refills | Status: DC

## 2018-08-26 NOTE — Progress Notes (Signed)
Virtual Visit Note  I connected with patient on 08/26/18 at 815am by telephone due technical issues and verified that I am speaking with the correct person using two identifiers. Kimberly Lyons is currently located at home and patient is currently with them during visit. The provider, Myles LippsIrma M Santiago, MD is located in their office at time of visit.  I discussed the limitations, risks, security and privacy concerns of performing an evaluation and management service by telephone and the availability of in person appointments. I also discussed with the patient that there may be a patient responsible charge related to this service. The patient expressed understanding and agreed to proceed.   CC: hosp followup  HPI  Admitted from 3/29-3/31 for acute resp failure, covid neg, new diagnosis of CHF, HTN and pre-diabetes Echo not done 2/2 covid restrictions, diuresed, started on carvedilol a1c 6.1 Records reviewed  Patient reports that day prior to onset of symptoms she was spraying roundup Has been working on weight loss Has lost about 11lbs since discharged, goal is to lose 100lbs in a year She is feeling well Nocturia has resolved, sleeping well, no orthopnea, pnd Works from home traditionally Used to snore, has not been since she came home Does not check BP at home Has appt with cardiology tomorrow  BP Readings from Last 3 Encounters:  07/30/18 136/74  05/10/14 132/80  03/05/14 132/90   Wt Readings from Last 3 Encounters:  08/26/18 280 lb 3.2 oz (127.1 kg)  07/29/18 294 lb 1.6 oz (133.4 kg)  05/10/14 (!) 318 lb (144.2 kg)   Lab Results  Component Value Date   HGBA1C 6.1 (H) 07/28/2018    No Known Allergies  Prior to Admission medications   Medication Sig Start Date End Date Taking? Authorizing Provider  carvedilol (COREG) 3.125 MG tablet Take 1 tablet (3.125 mg total) by mouth 2 (two) times daily with a meal for 30 days. 07/30/18 08/29/18 Yes Arrien, York RamMauricio Daniel, MD   furosemide (LASIX) 20 MG tablet Take 1 tablet (20 mg total) by mouth daily for 30 days. 07/31/18 08/30/18 Yes Arrien, York RamMauricio Daniel, MD  potassium chloride SA (K-DUR,KLOR-CON) 10 MEQ tablet Take 1 tablet (10 mEq total) by mouth daily for 30 days. 07/31/18 08/30/18 Yes Arrien, York RamMauricio Daniel, MD    No past medical history on file.  No past surgical history on file.  Social History   Tobacco Use  . Smoking status: Never Smoker  . Smokeless tobacco: Never Used  Substance Use Topics  . Alcohol use: No    Family History  Problem Relation Age of Onset  . Hyperlipidemia Mother   . Hypertension Mother   . Cancer Father   . Hypertension Father   . Cancer Sister   . Diabetes Sister   . Hyperlipidemia Sister     ROS  Per hpi Neg: fever, chills, chest pain, sob, dizziness, palpitations, orthopnea, PND, edema, abd pain, nausea, vomiting All other ROS neg   Objective  Vitals as reported by the patient: see weight above   ASSESSMENT and PLAN  1. Heart failure, unspecified HF chronicity, unspecified heart failure type [facility](HCC) Per reports patient seems to be euvolemic, sees cardiology tomorrow. Cont with regime for now. If echo shows right sided involvement, then I would advise sleep study given h/o snoring. Cont with LFM.  - Comprehensive metabolic panel; Future - Lipid panel; Future - TSH; Future  2. BMI 45.0-49.9, adult (HCC) 3. Prediabetes Cont wit LFM  Other orders - potassium chloride (K-DUR)  10 MEQ tablet; Take 1 tablet (10 mEq total) by mouth daily for 30 days. - furosemide (LASIX) 20 MG tablet; Take 1 tablet (20 mg total) by mouth daily for 30 days. - carvedilol (COREG) 3.125 MG tablet; Take 1 tablet (3.125 mg total) by mouth 2 (two) times daily with a meal for 30 days.  FOLLOW-UP: fasting labs this week, followup in 4 weeks.    The above assessment and management plan was discussed with the patient. The patient verbalized understanding of and has agreed to the management  plan. Patient is aware to call the clinic if symptoms persist or worsen. Patient is aware when to return to the clinic for a follow-up visit. Patient educated on when it is appropriate to go to the emergency department.    I provided 26 minutes of non-face-to-face time during this encounter.  Myles Lipps, MD Primary Care at [facility] [address] [place], [place] [address] Ph.  [phone] Fax [phone]

## 2018-08-26 NOTE — Progress Notes (Signed)
Virtual Visit via Video Note   This visit type was conducted due to national recommendations for restrictions regarding the COVID-19 Pandemic (e.g. social distancing) in an effort to limit this patient's exposure and mitigate transmission in our community.  Due to her co-morbid illnesses, this patient is at least at moderate risk for complications without adequate follow up.  This format is felt to be most appropriate for this patient at this time.  All issues noted in this document were discussed and addressed.  A limited physical exam was performed with this format.  Please refer to the patient's chart for her consent to telehealth for [facility].   Evaluation Performed:   New patient evaluation.   Date:  08/27/2018   ID:  Kimberly Lyons, DOB 01/17/1961, MRN [id number]  Patient Location: Home Provider Location: Home  PCP:  Myles LippsSantiago, Irma M, MD  Cardiologist:  Rollene RotundaJames Meadow Abramo, MD  Electrophysiologist:  None   Chief Complaint:  SOB   History of Present Illness:    Kimberly Lyons is a 58 y.o. female without cardiac history.    The patient was admitted on 3/31.  I reviewed these records for this visit.    She had acute respiratory failure.  She was treated for acute CHF but an echo was not done.  She was treated for a hypertensive urgency.  She reported that she did have an ER using some Roundup.  She then developed shortness of breath the next day.  The Saturday was the day she was doing work on Sunday she developed shortness of breath.  She was not describing any chest discomfort, neck or arm discomfort.  She was not really noticing any palpitations, presyncope or syncope.  However, it progressed to a significant shortness of breath at rest and she presented to the emergency room.  There she was hypertensive.  She does have lateral T wave and inferior T wave changes on her EKG but these were dynamic.  Her enzymes were negative.  She was treated for pulmonary edema on her chest x-ray.  Her  BNP was slightly elevated.  She resolved quickly and was sent home on the meds as listed.  In retrospect the patient climbs stairs all the time.  She is does not sound like she exercises but she does some yard work.  She denies chest pressure, neck or arm discomfort.  He does not notice palpitations, presyncope or syncope.  She has no PND or orthopnea.  She has no weight gain or edema.  Since she is gone home she is cut her salt down.  She is watching her diet.  She is lost about 12 pounds doing this.  She feels better.  The patient does not have symptoms concerning for COVID-19 infection (fever, chills, cough, or new shortness of breath).   PMH:  No significant past history  PSH:  No past surgeries.    Current Meds  Medication Sig  . carvedilol (COREG) 3.125 MG tablet Take 1 tablet (3.125 mg total) by mouth 2 (two) times daily with a meal for 30 days.  . furosemide (LASIX) 20 MG tablet Take 1 tablet (20 mg total) by mouth daily for 30 days.  . potassium chloride (K-DUR) 10 MEQ tablet Take 1 tablet (10 mEq total) by mouth daily for 30 days.     Allergies:   Patient has no known allergies.   Social History   Tobacco Use  . Smoking status: Never Smoker  . Smokeless tobacco: Never Used  Substance Use Topics  . Alcohol use: No  . Drug use: No     Family Hx: The patient's family history includes Cancer in her father and sister; Diabetes in her sister; Heart attack (age of onset: 69) in her mother; Hyperlipidemia in her mother and sister; Hypertension in her father and mother.  ROS:   Please see the history of present illness.    Snores. As stated in the HPI and negative for all other systems.   Prior CV studies:   The following studies were reviewed today: Hospital records  Labs/Other Tests and Data Reviewed:    EKG:  Normal sinus rhythm, rate 76, axis within normal limits, left ventricular hypertrophy by voltage criteria, probable repolarization changes with inferior and  anterolateral T wave inversions.  07/30/2018.  Recent Labs: 07/28/2018: ALT 65; B Natriuretic Peptide 463.1; Hemoglobin 14.3; Platelets 288 07/30/2018: BUN 25; Creatinine, Ser 1.09; Potassium 3.3; Sodium 138   Recent Lipid Panel No results found for: CHOL, TRIG, HDL, CHOLHDL, LDLCALC, LDLDIRECT  Wt Readings from Last 3 Encounters:  08/27/18 279 lb 6.4 oz (126.7 kg)  08/26/18 280 lb 3.2 oz (127.1 kg)  07/29/18 294 lb 1.6 oz (133.4 kg)     Objective:    Vital Signs:  Ht 5\' 9"  (1.753 m)   Wt 279 lb 6.4 oz (126.7 kg)   BMI 41.26 kg/m    GEN:  no acute distress EYES:  sclerae anicteric, EOMI - Extraocular Movements Intact CARDIOVASCULAR:  no peripheral edema NEURO:  alert and oriented x 3, no obvious focal deficit PSYCH:  normal affect  ASSESSMENT & PLAN:    ACUTE HF:  The patient will need to have an echoardiogram.  By history she seems to be euvolemic right now.  I suspect she has diastolic dysfunction with hypertension leading to LVH but the next step is the echocardiogram.  She needs no change in her medications.  I do the echocardiogram this week and see her in the office next week.  Further evaluation will be based on this study.  I will check a basic metabolic profile when I see her.  HTN: She is going to get a blood pressure cuff and bring this to correlated with our readings.  She will keep a blood pressure diary.  AKI:  Creat improved at discharge.  I will check this when she comes back.  HYPERGLYCEMIA: This will be managed conservatively.  We will follow A1c's.  MORBID OBEISTY: I am proud of her for her weight loss and we talked about diet.  COVID-19 Education: The signs and symptoms of COVID-19 were discussed with the patient and how to seek care for testing (follow up with PCP or arrange E-visit).  The importance of social distancing was discussed today.  Time:   Today, I have spent 25 minutes with the patient with telehealth technology discussing the above problems.      Medication Adjustments/Labs and Tests Ordered: Current medicines are reviewed at length with the patient today.  Concerns regarding medicines are outlined above.   Tests Ordered: No orders of the defined types were placed in this encounter.   Medication Changes: No orders of the defined types were placed in this encounter.   Disposition:  Follow up me after the echo  Signed, Rollene Rotunda, MD  08/27/2018 10:13 AM    [facility]

## 2018-08-26 NOTE — Telephone Encounter (Signed)
Call smartphone/ my chart/ consent/ pre reg completed °

## 2018-08-27 ENCOUNTER — Encounter: Payer: Self-pay | Admitting: Cardiology

## 2018-08-27 ENCOUNTER — Telehealth (INDEPENDENT_AMBULATORY_CARE_PROVIDER_SITE_OTHER): Payer: Managed Care, Other (non HMO) | Admitting: Cardiology

## 2018-08-27 VITALS — Ht 69.0 in | Wt 279.4 lb

## 2018-08-27 DIAGNOSIS — I5031 Acute diastolic (congestive) heart failure: Secondary | ICD-10-CM

## 2018-08-27 DIAGNOSIS — I10 Essential (primary) hypertension: Secondary | ICD-10-CM | POA: Insufficient documentation

## 2018-08-27 DIAGNOSIS — Z7189 Other specified counseling: Secondary | ICD-10-CM | POA: Insufficient documentation

## 2018-08-27 NOTE — Telephone Encounter (Signed)
Pt seen in office on 08/27/2018 by santiago.

## 2018-08-27 NOTE — Patient Instructions (Signed)
Medication Instructions:  Continue current medications  If you need a refill on your cardiac medications before your next appointment, please call your pharmacy.  Labwork: None Ordered   Testing/Procedures: Your physician has requested that you have an echocardiogram. Echocardiography is a painless test that uses sound waves to create images of your heart. It provides your doctor with information about the size and shape of your heart and how well your heart's chambers and valves are working. This procedure takes approximately one hour. There are no restrictions for this procedure.  Follow-Up: . Your physician recommends that you schedule a follow-up appointment in: Next Week   At [facility], you and your health needs are our priority.  As part of our continuing mission to provide you with exceptional heart care, we have created designated Provider Care Teams.  These Care Teams include your primary Cardiologist (physician) and Advanced Practice Providers (APPs -  Physician Assistants and Nurse Practitioners) who all work together to provide you with the care you need, when you need it.  Thank you for choosing [facility] at [facility]!!

## 2018-08-29 ENCOUNTER — Other Ambulatory Visit: Payer: Self-pay

## 2018-08-29 ENCOUNTER — Ambulatory Visit (HOSPITAL_COMMUNITY): Payer: Managed Care, Other (non HMO) | Attending: Cardiovascular Disease

## 2018-08-29 DIAGNOSIS — I5031 Acute diastolic (congestive) heart failure: Secondary | ICD-10-CM | POA: Insufficient documentation

## 2018-09-04 ENCOUNTER — Encounter: Payer: Self-pay | Admitting: Cardiology

## 2018-09-04 NOTE — Progress Notes (Signed)
Cardiology Office Note   Date:  09/05/2018   ID:  Kimberly Lyons, DOB 05/23/1960, MRN [id number]  PCP:  Myles LippsSantiago, Irma M, MD  Cardiologist:   Rollene RotundaJames Karlo Goeden, MD  AKI Chief Complaint  Patient presents with   Shortness of Breath     History of Present Illness: [name] is a 58 y.o. female who presents for follow up of CHF.    The patient was admitted on 3/31.  I reviewed these records for this visit.    She had acute respiratory failure.  She was treated for acute CHF but an echo was not done.  I "saw" her with a tele and sent her for an echo with results below.    She was found to have an EF of 30% with severe LVH and global hypokinesis.  Since discharge she continues to feel very well.  The patient denies any new symptoms such as chest discomfort, neck or arm discomfort. There has been no new shortness of breath, PND or orthopnea. There have been no reported palpitations, presyncope or syncope.  She is dieting and she is continuing to lose weight.  She has no swelling.     Past Medical History:  Diagnosis Date   Acute systolic HF (heart failure) (HCC)    Hyperglycemia    Morbid obesity (HCC)     History reviewed. No pertinent surgical history.   Current Outpatient Medications  Medication Sig Dispense Refill   carvedilol (COREG) 3.125 MG tablet Take 1 tablet (3.125 mg total) by mouth 2 (two) times daily with a meal for 30 days. 60 tablet 5   furosemide (LASIX) 20 MG tablet Take 1 tablet (20 mg total) by mouth daily for 30 days. 30 tablet 5   potassium chloride (K-DUR) 10 MEQ tablet Take 1 tablet (10 mEq total) by mouth daily for 30 days. 30 tablet 5   sacubitril-valsartan (ENTRESTO) 24-26 MG Take 1 tablet by mouth 2 (two) times daily. 60 tablet 11   No current facility-administered medications for this visit.     Allergies:   Patient has no known allergies.   ROS:  Please see the history of present illness.   Otherwise, review of systems are positive for  none.   All other systems are reviewed and negative.    PHYSICAL EXAM: VS:  BP 122/75    Pulse 67    Ht 5\' 9"  (1.753 m)    Wt 275 lb 9.6 oz (125 kg)    BMI 40.70 kg/m  , BMI Body mass index is 40.7 kg/m. GENERAL:  Well appearing HEENT:  Pupils equal round and reactive, fundi not visualized, oral mucosa unremarkable NECK:  No jugular venous distention, waveform within normal limits, carotid upstroke brisk and symmetric, no bruits, no thyromegaly LYMPHATICS:  No cervical, inguinal adenopathy LUNGS:  Clear to auscultation bilaterally BACK:  No CVA tenderness CHEST:  Unremarkable HEART:  PMI not displaced or sustained,S1 and S2 within normal limits, no S3, no S4, no clicks, no rubs, no murmurs ABD:  Flat, positive bowel sounds normal in frequency in pitch, no bruits, no rebound, no guarding, no midline pulsatile mass, no hepatomegaly, no splenomegaly EXT:  2 plus pulses throughout, no edema, no cyanosis no clubbing SKIN:  No rashes no nodules NEURO:  Cranial nerves II through XII grossly intact, motor grossly intact throughout PSYCH:  Cognitively intact, oriented to person place and time    EKG:  EKG is not ordered today.   Recent Labs: 07/28/2018:  ALT 65; B Natriuretic Peptide 463.1; Hemoglobin 14.3; Platelets 288 07/30/2018: BUN 25; Creatinine, Ser 1.09; Potassium 3.3; Sodium 138    Lipid Panel No results found for: CHOL, TRIG, HDL, CHOLHDL, VLDL, LDLCALC, LDLDIRECT    Wt Readings from Last 3 Encounters:  09/05/18 275 lb 9.6 oz (125 kg)  08/27/18 279 lb 6.4 oz (126.7 kg)  08/26/18 280 lb 3.2 oz (127.1 kg)     ECHO:      1. Severe dyskinesis of the left ventricular, basal-mid anteroseptal wall.  2. The left ventricle has moderate-severely reduced systolic function, with an ejection fraction of 30-35%. The cavity size was normal. There is severely increased left ventricular wall thickness. Left ventricular diastolic Doppler parameters are  consistent with impaired relaxation.   3. The right ventricle has normal systolic function. The cavity was normal. There is no increase in right ventricular wall thickness.  4. The mitral valve is abnormal. Moderate thickening of the mitral valve leaflet. Mild calcification of the mitral valve leaflet.  5. The aortic root and ascending aorta are normal in size and structure.  6. The interatrial septum was not assessed.  Other studies Reviewed: Additional studies/ records that were reviewed today include: Hospital records. Review of the above records demonstrates:  Please see elsewhere in the note.     ASSESSMENT AND PLAN:  ACUTE HF:    Her EF is reduced as above.  I do not strongly suspect ischemia.  I will plan likely a Lexiscan Myoview in the future.  She will also likely need an MRI given the the LVH.    Today I am going to add Entresto.  I will check a BMET as below.   HTN:   This is being managed in the context of treating his CHF  AKI:      Creat improved at discharge.  I will check this when she comes back in a couple of weeks when we follow her up in the clinic in a couple of weeks for med titration.    HYPERGLYCEMIA:     I will follow this conservatively.   MORBID OBEISTY:    I continue to be proud of her weight loss.     Current medicines are reviewed at length with the patient today.  The patient does not have concerns regarding medicines.  The following changes have been made:  no change  Labs/ tests ordered today include:   Orders Placed This Encounter  Procedures   Basic Metabolic Panel (BMET)     Disposition:   FU with for med titration and labs in the office in two weeks.      Signed, Rollene Rotunda, MD  09/05/2018 4:21 PM    [facility]

## 2018-09-05 ENCOUNTER — Ambulatory Visit (INDEPENDENT_AMBULATORY_CARE_PROVIDER_SITE_OTHER): Payer: Managed Care, Other (non HMO) | Admitting: Cardiology

## 2018-09-05 ENCOUNTER — Encounter: Payer: Self-pay | Admitting: Cardiology

## 2018-09-05 ENCOUNTER — Other Ambulatory Visit: Payer: Self-pay

## 2018-09-05 VITALS — BP 122/75 | HR 67 | Ht 69.0 in | Wt 275.6 lb

## 2018-09-05 DIAGNOSIS — Z79899 Other long term (current) drug therapy: Secondary | ICD-10-CM | POA: Diagnosis not present

## 2018-09-05 DIAGNOSIS — N179 Acute kidney failure, unspecified: Secondary | ICD-10-CM | POA: Diagnosis not present

## 2018-09-05 DIAGNOSIS — I5021 Acute systolic (congestive) heart failure: Secondary | ICD-10-CM

## 2018-09-05 DIAGNOSIS — I10 Essential (primary) hypertension: Secondary | ICD-10-CM

## 2018-09-05 MED ORDER — SACUBITRIL-VALSARTAN 24-26 MG PO TABS: 1.0000 | ORAL_TABLET | Freq: Two times a day (BID) | ORAL | 11 refills | Status: DC

## 2018-09-05 NOTE — Patient Instructions (Addendum)
Medication Instructions:  START- Entresto 24/26 mg twice a day  If you need a refill on your cardiac medications before your next appointment, please call your pharmacy.  Labwork: BMP in 2 weeks HERE IN OUR OFFICE AT [facility]   You will NOT need to fast   Take the provided lab slips with you to the lab for your blood draw.   When you have your labs (blood work) drawn today and your tests are completely normal, you will receive your results only by MyChart Message (if you have MyChart) -OR-  A paper copy in the mail.  If you have any lab test that is abnormal or we need to change your treatment, we will call you to review these results.  Testing/Procedures: None Ordered  Follow-Up: Your physician recommends that you schedule a follow-up appointment in: 2 Weeks May 22nd  At [facility], you and your health needs are our priority.  As part of our continuing mission to provide you with exceptional heart care, we have created designated Provider Care Teams.  These Care Teams include your primary Cardiologist (physician) and Advanced Practice Providers (APPs -  Physician Assistants and Nurse Practitioners) who all work together to provide you with the care you need, when you need it.  Thank you for choosing [facility] at [facility]!!

## 2018-09-19 ENCOUNTER — Ambulatory Visit: Payer: Managed Care, Other (non HMO)

## 2018-09-19 ENCOUNTER — Other Ambulatory Visit: Payer: Self-pay

## 2018-09-19 DIAGNOSIS — I509 Heart failure, unspecified: Secondary | ICD-10-CM

## 2018-09-19 NOTE — Progress Notes (Signed)
Cardiology Office Note   Date:  09/20/2018   ID:  Kimberly Lyons, DOB 02/01/1961, MRN [id number]  PCP:  Myles LippsSantiago, Kimberly M, MD  Cardiologist:   Rollene RotundaJames Barbera Perritt, MD   Chief Complaint  Patient presents with  . Cardiomyopathy      History of Present Illness: [name] is a 58 y.o. female who  presents for follow up of CHF.    The patient was admitted on 3/31She had acute respiratory failure. She was treated for acute CHF but an echo was not done.  I "saw" her with a tele and sent her for an echo with results below.  She was found to have an EF of 30% with severe LVH and global hypokinesis.  [facility]    She did feel slightly lightheaded with this but then she began to tolerate it.  She feels well. The patient denies any new symptoms such as chest discomfort, neck or arm discomfort. There has been no new shortness of breath, PND or orthopnea. There have been no reported palpitations, presyncope or syncope.    Past Medical History:  Diagnosis Date  . Acute systolic HF (heart failure) (HCC)   . Hyperglycemia   . Morbid obesity (HCC)     History reviewed. No pertinent surgical history.   Current Outpatient Medications  Medication Sig Dispense Refill  . carvedilol (COREG) 6.25 MG tablet Take 1 tablet (6.25 mg total) by mouth 2 (two) times daily with a meal for 30 days. 180 tablet 3  . furosemide (LASIX) 20 MG tablet Take 1 tablet (20 mg total) by mouth daily for 30 days. 90 tablet 3  . potassium chloride (K-DUR) 10 MEQ tablet Take 1 tablet (10 mEq total) by mouth daily for 30 days. 90 tablet 3  . sacubitril-valsartan (ENTRESTO) 24-26 MG Take 1 tablet by mouth 2 (two) times daily. 180 tablet 3   No current facility-administered medications for this visit.     Allergies:   Patient has no known allergies.    ROS:  Please see the history of present illness.   Otherwise, review of systems are positive for none.   All other systems are reviewed and  negative.    PHYSICAL EXAM: VS:  BP 115/77   Pulse 77   Ht 5\' 9"  (1.753 m)   Wt 271 lb 6.4 oz (123.1 kg)   BMI 40.08 kg/m  , BMI Body mass index is 40.08 kg/m. GENERAL:  Well appearing NECK:  No jugular venous distention, waveform within normal limits, carotid upstroke brisk and symmetric, no bruits, no thyromegaly LUNGS:  Clear to auscultation bilaterally CHEST:  Unremarkable HEART:  PMI not displaced or sustained,S1 and S2 within normal limits, no S3, no S4, no clicks, no rubs, no murmurs ABD:  Flat, positive bowel sounds normal in frequency in pitch, no bruits, no rebound, no guarding, no midline pulsatile mass, no hepatomegaly, no splenomegaly EXT:  2 plus pulses throughout, no edema, no cyanosis no clubbing  EKG:  EKG is not ordered today.    Recent Labs: 07/28/2018: B Natriuretic Peptide 463.1; Hemoglobin 14.3; Platelets 288 09/19/2018: ALT 19; BUN 14; Creatinine, Ser 0.99; Potassium 5.1; Sodium 140; TSH 1.900    Lipid Panel    Component Value Date/Time   CHOL 149 09/19/2018 0918   TRIG 141 09/19/2018 0918   HDL 36 (L) 09/19/2018 0918   CHOLHDL 4.1 09/19/2018 0918   LDLCALC 85 09/19/2018 0918      Wt  Readings from Last 3 Encounters:  09/20/18 271 lb 6.4 oz (123.1 kg)  09/05/18 275 lb 9.6 oz (125 kg)  08/27/18 279 lb 6.4 oz (126.7 kg)      Other studies Reviewed: Additional studies/ records that were reviewed today include: Labs. Review of the above records demonstrates:  Please see elsewhere in the note.     ASSESSMENT AND PLAN:  ACUTE HF:   She had blood work done yesterday and her BUN and creatinine are fine.  She is getting change her Lasix to as needed.  I am going to increase her carvedilol to 6.25 mg twice daily.  Further med changes will be based on future blood pressures.  I will consider a Myoview and she might even need an MRI in the future.  There is an element of LVH.  HTN:  This is being managed in the context of treating his CHF     AKI:      Creatinine was okay.  Meds will be as above.  HYPERGLYCEMIA:    Her last A1c was 6.1.  No change in therapy.  MORBID OBEISTY:  I continue applaud her weight loss and encouraged more of the same.  Current medicines are reviewed at length with the patient today.  The patient does not have concerns regarding medicines.  The following changes have been made:  As above  Labs/ tests ordered today include: None  No orders of the defined types were placed in this encounter.    Disposition:   FU with with me virtually in one month    Signed, Rollene Rotunda, MD  09/20/2018 12:28 PM    [facility]

## 2018-09-20 ENCOUNTER — Encounter: Payer: Self-pay | Admitting: Cardiology

## 2018-09-20 ENCOUNTER — Ambulatory Visit (INDEPENDENT_AMBULATORY_CARE_PROVIDER_SITE_OTHER): Payer: Managed Care, Other (non HMO) | Admitting: Cardiology

## 2018-09-20 VITALS — BP 115/77 | HR 77 | Ht 69.0 in | Wt 271.4 lb

## 2018-09-20 DIAGNOSIS — I10 Essential (primary) hypertension: Secondary | ICD-10-CM | POA: Diagnosis not present

## 2018-09-20 DIAGNOSIS — I5021 Acute systolic (congestive) heart failure: Secondary | ICD-10-CM

## 2018-09-20 LAB — COMPREHENSIVE METABOLIC PANEL
ALT: 19 IU/L (ref 0–32)
AST: 17 IU/L (ref 0–40)
Albumin/Globulin Ratio: 1.7 (ref 1.2–2.2)
Albumin: 4.1 g/dL (ref 3.8–4.9)
Alkaline Phosphatase: 89 IU/L (ref 39–117)
BUN/Creatinine Ratio: 14 (ref 12–28)
BUN: 14 mg/dL (ref 8–27)
Bilirubin Total: 0.5 mg/dL (ref 0.0–1.2)
CO2: 22 mmol/L (ref 20–29)
Calcium: 9.5 mg/dL (ref 8.7–10.3)
Chloride: 104 mmol/L (ref 96–106)
Creatinine, Ser: 0.99 mg/dL (ref 0.57–1.00)
GFR calc Af Amer: 72 mL/min/{1.73_m2} (ref >59)
GFR calc non Af Amer: 62 mL/min/{1.73_m2} (ref >59)
Globulin, Total: 2.4 g/dL (ref 1.5–4.5)
Glucose: 126 mg/dL — ABNORMAL HIGH (ref 65–99)
Potassium: 5.1 mmol/L (ref 3.5–5.2)
Sodium: 140 mmol/L (ref 134–144)
Total Protein: 6.5 g/dL (ref 6.0–8.5)

## 2018-09-20 LAB — LIPID PANEL
Chol/HDL Ratio: 4.1 ratio (ref 0.0–4.4)
Cholesterol, Total: 149 mg/dL (ref 100–199)
HDL: 36 mg/dL — ABNORMAL LOW (ref >39)
LDL Calculated: 85 mg/dL (ref 0–99)
Triglycerides: 141 mg/dL (ref 0–149)
VLDL Cholesterol Cal: 28 mg/dL (ref 5–40)

## 2018-09-20 LAB — TSH: TSH: 1.9 u[IU]/mL (ref 0.450–4.500)

## 2018-09-20 MED ORDER — CARVEDILOL 6.25 MG PO TABS: 6.2500 mg | ORAL_TABLET | Freq: Two times a day (BID) | ORAL | 3 refills | Status: DC

## 2018-09-20 MED ORDER — FUROSEMIDE 20 MG PO TABS: 20.0000 mg | ORAL_TABLET | Freq: Every day | ORAL | 3 refills | Status: DC

## 2018-09-20 MED ORDER — SACUBITRIL-VALSARTAN 24-26 MG PO TABS: 1.0000 | ORAL_TABLET | Freq: Two times a day (BID) | ORAL | 3 refills | Status: DC

## 2018-09-20 MED ORDER — POTASSIUM CHLORIDE CRYS ER 10 MEQ PO TBCR: 10.0000 meq | EXTENDED_RELEASE_TABLET | Freq: Every day | ORAL | 3 refills | Status: DC

## 2018-09-20 NOTE — Patient Instructions (Signed)
Medication Instructions:  INCREASE- Carvedilol 6.25 mg twice a day  If you need a refill on your cardiac medications before your next appointment, please call your pharmacy.  Labwork: None Ordered   Testing/Procedures: None Ordered  Follow-Up: . Your physician recommends that you schedule a Virtual appointment in: Friday June 19 @ 11:40 am   At [facility], you and your health needs are our priority.  As part of our continuing mission to provide you with exceptional heart care, we have created designated Provider Care Teams.  These Care Teams include your primary Cardiologist (physician) and Advanced Practice Providers (APPs -  Physician Assistants and Nurse Practitioners) who all work together to provide you with the care you need, when you need it.  Thank you for choosing [facility] at [facility]!!

## 2018-09-24 ENCOUNTER — Ambulatory Visit: Payer: Managed Care, Other (non HMO) | Admitting: Cardiology

## 2018-09-25 ENCOUNTER — Ambulatory Visit: Payer: Self-pay | Admitting: Family Medicine

## 2018-10-08 NOTE — Telephone Encounter (Signed)
CANCELLED APPOINTMENT

## 2018-10-15 ENCOUNTER — Telehealth: Payer: Self-pay | Admitting: Cardiology

## 2018-10-15 NOTE — Telephone Encounter (Signed)
Mychart, smartphone, consent, pre reg complete 10/15/18 AF

## 2018-10-17 NOTE — Progress Notes (Signed)
Virtual Visit via Video Note   This visit type was conducted due to national recommendations for restrictions regarding the COVID-19 Pandemic (e.g. social distancing) in an effort to limit this patient's exposure and mitigate transmission in our community.  Due to her co-morbid illnesses, this patient is at least at moderate risk for complications without adequate follow up.  This format is felt to be most appropriate for this patient at this time.  All issues noted in this document were discussed and addressed.  A limited physical exam was performed with this format.  Please refer to the patient's chart for her consent to telehealth for [facility].   Date:  10/18/2018   ID:  Kimberly Lyons, DOB 07/09/1960, MRN [id number]  Patient Location: Home Provider Location: Home  PCP:  Myles LippsSantiago, Irma M, MD  Cardiologist:  Rollene RotundaJames Desha Bitner, MD  Electrophysiologist:  None   Evaluation Performed:  Follow-Up Visit  Chief Complaint:  Dyspnea  History of Present Illness:    Kimberly Lyons is a 58 y.o. female who  presents for follow up of CHF.The patient was admitted on 3/31She had acute respiratory failure. She was treated for acute CHF but an echo was not done.After our first visit I ordered an echo. She was found to have an EF of 30% with severe LVH and global hypokinesis.  I have titrated her meds.  At the last visit I titrated the Coreg.   She was slightly lightheaded but has been actually done very well since then.  She denies any shortness of breath, PND or orthopnea.  She is lost even more weight!.  She is not having swelling.  She is not having PND or orthopnea.  She denies any chest pressure, neck or arm discomfort.  She feels much better than she did when she started management.   The patient does not have symptoms concerning for COVID-19 infection (fever, chills, cough, or new shortness of breath).    Past Medical History:  Diagnosis Date  . Acute systolic HF (heart failure)  (HCC)   . Hyperglycemia   . Morbid obesity (HCC)    No past surgical history on file.   Current Meds  Medication Sig  . carvedilol (COREG) 6.25 MG tablet Take 1 tablet (6.25 mg total) by mouth 2 (two) times daily with a meal for 30 days.  . sacubitril-valsartan (ENTRESTO) 24-26 MG Take 1 tablet by mouth 2 (two) times daily.     Allergies:   Patient has no known allergies.   Social History   Tobacco Use  . Smoking status: Never Smoker  . Smokeless tobacco: Never Used  Substance Use Topics  . Alcohol use: No  . Drug use: No     Family Hx: The patient's family history includes Cancer in her father and sister; Diabetes in her sister; Heart attack (age of onset: 6082) in her mother; Hyperlipidemia in her mother and sister; Hypertension in her father and mother.  ROS:   Please see the history of present illness.    As stated in the HPI and negative for all other systems.   Prior CV studies:   The following studies were reviewed today:  None  Labs/Other Tests and Data Reviewed:    EKG:  No ECG reviewed.  Recent Labs: 07/28/2018: B Natriuretic Peptide 463.1; Hemoglobin 14.3; Platelets 288 09/19/2018: ALT 19; BUN 14; Creatinine, Ser 0.99; Potassium 5.1; Sodium 140; TSH 1.900   Recent Lipid Panel Lab Results  Component Value Date/Time   CHOL 149  09/19/2018 09:18 AM   TRIG 141 09/19/2018 09:18 AM   HDL 36 (L) 09/19/2018 09:18 AM   CHOLHDL 4.1 09/19/2018 09:18 AM   LDLCALC 85 09/19/2018 09:18 AM    Wt Readings from Last 3 Encounters:  10/18/18 257 lb 12.8 oz (116.9 kg)  09/20/18 271 lb 6.4 oz (123.1 kg)  09/05/18 275 lb 9.6 oz (125 kg)     Objective:    Vital Signs:  BP 117/72   Pulse (!) 58   Ht 5\' 9"  (1.753 m)   Wt 257 lb 12.8 oz (116.9 kg)   BMI 38.07 kg/m    VITAL SIGNS:  reviewed GEN:  no acute distress EYES:  sclerae anicteric, EOMI - Extraocular Movements Intact NEURO:  alert and oriented x 3, no obvious focal deficit PSYCH:  normal affect    ASSESSMENT & PLAN:    ACUTE HF: She is doing very well symptomatically.  I am going to double her Entresto.  I will see her back in 1 month for the next med titration.  I would probably plan a follow-up echocardiogram in the fall.  Provided the results of this I might consider a Myoview though I am not strongly in suspecting ischemia.  I might also consider an MRI.  There is an element of LVH.   HTN: This is being managed in the context of treating his CHF  AKI:  I will follow up at the next med titration.   HYPERGLYCEMIA: Her last A1c was 6.1.  No change in therapy.   MORBID OBEISTY:  I am very proud of her positive attitude and her weight loss.  COVID-19 Education: The signs and symptoms of COVID-19 were discussed with the patient and how to seek care for testing (follow up with PCP or arrange E-visit).  The importance of social distancing was discussed today.  Time:   Today, I have spent 25 minutes with the patient with telehealth technology discussing the above problems.     Medication Adjustments/Labs and Tests Ordered: Current medicines are reviewed at length with the patient today.  Concerns regarding medicines are outlined above.   Tests Ordered: No orders of the defined types were placed in this encounter.   Medication Changes: No orders of the defined types were placed in this encounter.   Follow Up:  Virtual Visit in one month  Signed, Minus Breeding, MD  10/18/2018 11:58 AM    [place]

## 2018-10-18 ENCOUNTER — Encounter: Payer: Self-pay | Admitting: Cardiology

## 2018-10-18 ENCOUNTER — Telehealth (INDEPENDENT_AMBULATORY_CARE_PROVIDER_SITE_OTHER): Payer: Managed Care, Other (non HMO) | Admitting: Cardiology

## 2018-10-18 ENCOUNTER — Encounter: Payer: Self-pay | Admitting: *Deleted

## 2018-10-18 ENCOUNTER — Telehealth: Payer: Self-pay | Admitting: Cardiology

## 2018-10-18 VITALS — BP 117/72 | HR 58 | Ht 69.0 in | Wt 257.8 lb

## 2018-10-18 DIAGNOSIS — Z7189 Other specified counseling: Secondary | ICD-10-CM

## 2018-10-18 DIAGNOSIS — I5021 Acute systolic (congestive) heart failure: Secondary | ICD-10-CM

## 2018-10-18 DIAGNOSIS — I10 Essential (primary) hypertension: Secondary | ICD-10-CM

## 2018-10-18 MED ORDER — ENTRESTO 49-51 MG PO TABS: 1.0000 | ORAL_TABLET | Freq: Two times a day (BID) | ORAL | 5 refills | Status: DC

## 2018-10-18 NOTE — Telephone Encounter (Signed)
Routed to Melinda LPN 

## 2018-10-18 NOTE — Telephone Encounter (Signed)
New Message ° ° ° °Pt is returning a call  ° ° °Please call back  °

## 2018-10-18 NOTE — Patient Instructions (Addendum)
Medication Instructions:  INCREASE YOUR ENTRESTO TO 49/51 MG TWICE A DAY   If you need a refill on your cardiac medications before your next appointment, please call your pharmacy.   Lab work: NONE  Testing/Procedures: NONE  Follow-Up: Your physician recommends that you schedule a follow-up appointment in: 1 month virtual visit  THE OFFICE [place]

## 2018-10-18 NOTE — Telephone Encounter (Signed)
Reviewed AVS from today's visit with patient

## 2018-10-25 NOTE — Telephone Encounter (Signed)
-----   Message from Rockne Menghini, [place] sent at 10/21/2018  7:20 AM EDT ----- There's nothing in the manufacturer info that would suggest it.  We often see it with diuretics, and while this increases sodium excretion, they don't indicate that it actually causes diuresis.  That being said, I wouldn't be surprised if some people are more sun sensitive.    K ----- Message ----- From: Earvin Hansen, LPN Sent: 9/47/0761   5:59 PM EDT To: Rockne Menghini, RPH-CPP, #  Hey Is there an issue with Entresto make you being more sensitive to sun? Thanks [employer]

## 2018-10-25 NOTE — Telephone Encounter (Signed)
Advised patient, verbalized understanding  

## 2018-11-12 ENCOUNTER — Encounter: Payer: Self-pay | Admitting: *Deleted

## 2018-11-15 ENCOUNTER — Telehealth: Payer: Self-pay | Admitting: Cardiology

## 2018-11-15 NOTE — Telephone Encounter (Signed)
I called pt to confirm  Her appt for 11-18-18.         COVID-19 Pre-Screening Questions:   In the past 7 to 10 days have you had a cough,  shortness of breath, headache, congestion, fever (100 or greater) body aches, chills, sore throat, or sudden loss of taste or sense of smell? no  Have you been around anyone with known Covid 19.  Have you been around anyone who is awaiting Covid 19 test results in the past 7 to 10 days? no  Have you been around anyone who has been exposed to Covid 19, or has mentioned symptoms of Covid 19 within the past 7 to 10 days? no  If you have any concerns/questions about symptoms patients report during screening (either on the phone or at [facility]). Contact the provider seeing the patient or DOD for further guidance.  If neither are available contact a member of the leadership team.

## 2018-11-17 NOTE — Progress Notes (Signed)
Cardiology Office Note   Date:  11/18/2018   ID:  Natalya, Domzalski 30-Dec-1960, MRN [id number]  PCP:  Rutherford Guys, MD  Cardiologist:   Minus Breeding, MD  AKI Chief Complaint  Patient presents with  . Cardiomyopathy     History of Present Illness: Kimberly Lyons is a 58 y.o. female who presents for follow up of CHF.    The patient was admitted on 3/31.  I reviewed these records for this visit.    She had acute respiratory failure.  She was treated for acute CHF but an echo was not done.  I "saw" her with a tele and sent her for an echo with results below.  She was found to have an EF of 30% with severe LVH and global hypokinesis.  We have continued to titrate meds.  At the last visit I added Entresto.   She did okay with this.  He had a little bit of lightheadedness.  Her biggest issue has been more around anxiety.  She denies any presyncope or syncope.  Says she is breathing better.  She is walking every day.  She denies PND or orthopnea.  Said no palpitations.  She has had no chest pressure.  She is continued to have intentional weight loss with diet and exercise.  She is had no leg swelling.   Past Medical History:  Diagnosis Date  . Acute systolic HF (heart failure) ([place])   . Hyperglycemia   . Morbid obesity ([place])     History reviewed. No pertinent surgical history.   Current Outpatient Medications  Medication Sig Dispense Refill  . carvedilol (COREG) 12.5 MG tablet Take 1 tablet (12.5 mg total) by mouth 2 (two) times daily with a meal. 180 tablet 3  . furosemide (LASIX) 20 MG tablet Take 1 tablet (20 mg total) by mouth daily for 30 days. 90 tablet 3  . potassium chloride (K-DUR) 10 MEQ tablet Take 1 tablet (10 mEq total) by mouth daily for 30 days. 90 tablet 3  . sacubitril-valsartan (ENTRESTO) 49-51 MG Take 1 tablet by mouth 2 (two) times daily. 60 tablet 5   No current facility-administered medications for this visit.     Allergies:   Patient has no known  allergies.   ROS:  Please see the history of present illness.   Otherwise, review of systems are positive for none.   All other systems are reviewed and negative.    PHYSICAL EXAM: VS:  BP 108/60 (BP Location: Left Arm, Patient Position: Sitting, Cuff Size: Normal)   Pulse 65   Temp (!) 97.3 F (36.3 C)   Ht '5\' 9"'$  (1.753 m)   Wt 249 lb (112.9 kg)   BMI 36.77 kg/m  , BMI Body mass index is 36.77 kg/m. GENERAL:  Well appearing NECK:  No jugular venous distention, waveform within normal limits, carotid upstroke brisk and symmetric, no bruits, no thyromegaly LUNGS:  Clear to auscultation bilaterally CHEST:  Unremarkable HEART:  PMI not displaced or sustained,S1 and S2 within normal limits, no S3, no S4, no clicks, no rubs, no murmurs ABD:  Flat, positive bowel sounds normal in frequency in pitch, no bruits, no rebound, no guarding, no midline pulsatile mass, no hepatomegaly, no splenomegaly EXT:  2 plus pulses throughout, no edema, no cyanosis no clubbing   EKG:  EKG is not ordered today. Sinus rhythm, rate 65, axis within normal limits, intervals within normal limits, premature ventricular contractions.   Recent Labs: 07/28/2018: B  Natriuretic Peptide 463.1; Hemoglobin 14.3; Platelets 288 09/19/2018: ALT 19; BUN 14; Creatinine, Ser 0.99; Potassium 5.1; Sodium 140; TSH 1.900    Lipid Panel    Component Value Date/Time   CHOL 149 09/19/2018 0918   TRIG 141 09/19/2018 0918   HDL 36 (L) 09/19/2018 0918   CHOLHDL 4.1 09/19/2018 0918   LDLCALC 85 09/19/2018 0918      Wt Readings from Last 3 Encounters:  11/18/18 249 lb (112.9 kg)  10/18/18 257 lb 12.8 oz (116.9 kg)  09/20/18 271 lb 6.4 oz (123.1 kg)     ECHO:      1. Severe dyskinesis of the left ventricular, basal-mid anteroseptal wall.  2. The left ventricle has moderate-severely reduced systolic function, with an ejection fraction of 30-35%. The cavity size was normal. There is severely increased left ventricular wall  thickness. Left ventricular diastolic Doppler parameters are  consistent with impaired relaxation.  3. The right ventricle has normal systolic function. The cavity was normal. There is no increase in right ventricular wall thickness.  4. The mitral valve is abnormal. Moderate thickening of the mitral valve leaflet. Mild calcification of the mitral valve leaflet.  5. The aortic root and ascending aorta are normal in size and structure.  6. The interatrial septum was not assessed.  Other studies Reviewed: Additional studies/ records that were reviewed today include: None Review of the above records demonstrates:      ASSESSMENT AND PLAN:  ACUTE HF:    Her EF is reduced as above.  I do not strongly suspect ischemia.  I will plan likely a Lexiscan Myoview in the future.  She will also likely need an MRI given the the LVH.    She needs a BMET today.   I am going to increase her carvedilol to 12.5 mg twice daily.  HTN:   This is being managed in the context of treating his CHF   AKI:      Creat improved at discharge.  I will check a be met as above.  HYPERGLYCEMIA:     This is being treated with weight loss and diet.  MORBID OBEISTY:  Very proud of her weight loss.  Current medicines are reviewed at length with the patient today.  The patient does not have concerns regarding medicines.  The following changes have been made:  As above  Labs/ tests ordered today include:   Orders Placed This Encounter  Procedures  . Basic metabolic panel  . EKG 12-Lead     Disposition:   FU me in 4 weeks.      Signed, Minus Breeding, MD  11/18/2018 11:19 AM    [place]

## 2018-11-18 ENCOUNTER — Other Ambulatory Visit: Payer: Self-pay

## 2018-11-18 ENCOUNTER — Ambulatory Visit (INDEPENDENT_AMBULATORY_CARE_PROVIDER_SITE_OTHER): Payer: Managed Care, Other (non HMO) | Admitting: Cardiology

## 2018-11-18 ENCOUNTER — Encounter: Payer: Self-pay | Admitting: Cardiology

## 2018-11-18 VITALS — BP 108/60 | HR 65 | Temp 97.3°F | Ht 69.0 in | Wt 249.0 lb

## 2018-11-18 DIAGNOSIS — I5022 Chronic systolic (congestive) heart failure: Secondary | ICD-10-CM

## 2018-11-18 DIAGNOSIS — Z79899 Other long term (current) drug therapy: Secondary | ICD-10-CM

## 2018-11-18 DIAGNOSIS — I10 Essential (primary) hypertension: Secondary | ICD-10-CM | POA: Diagnosis not present

## 2018-11-18 LAB — BASIC METABOLIC PANEL
BUN/Creatinine Ratio: 16 (ref 12–28)
BUN: 15 mg/dL (ref 8–27)
CO2: 19 mmol/L — ABNORMAL LOW (ref 20–29)
Calcium: 9.3 mg/dL (ref 8.7–10.3)
Chloride: 103 mmol/L (ref 96–106)
Creatinine, Ser: 0.93 mg/dL (ref 0.57–1.00)
GFR calc Af Amer: 77 mL/min/{1.73_m2} (ref >59)
GFR calc non Af Amer: 67 mL/min/{1.73_m2} (ref >59)
Glucose: 114 mg/dL — ABNORMAL HIGH (ref 65–99)
Potassium: 4.6 mmol/L (ref 3.5–5.2)
Sodium: 137 mmol/L (ref 134–144)

## 2018-11-18 MED ORDER — CARVEDILOL 12.5 MG PO TABS: 12.5000 mg | ORAL_TABLET | Freq: Two times a day (BID) | ORAL | 3 refills | Status: DC

## 2018-11-18 NOTE — Patient Instructions (Signed)
Medication Instructions:  INCREASE CARVEDILOL 12.5MG  TWICE DAILY If you need a refill on your cardiac medications before your next appointment, please call your pharmacy.  Labwork: BMET TODAY HERE IN OUR OFFICE AT [facility]   Take the provided lab slips with you to the lab for your blood draw.   When you have your labs (blood work) drawn today and your tests are completely normal, you will receive your results only by MyChart Message (if you have MyChart) -OR-  A paper copy in the mail.  If you have any lab test that is abnormal or we need to change your treatment, we will call you to review these results.  Follow-Up: You will need a follow up appointment in 1 months.   You may see Minus Breeding, MD or one of the following Advanced Practice Providers on your designated Care Team:  Rosaria Ferries, PA-C  Jory Sims, DNP, ANP     At [facility], you and your health needs are our priority.  As part of our continuing mission to provide you with exceptional heart care, we have created designated Provider Care Teams.  These Care Teams include your primary Cardiologist (physician) and Advanced Practice Providers (APPs -  Physician Assistants and Nurse Practitioners) who all work together to provide you with the care you need, when you need it.  Thank you for choosing [facility] at [facility]!!

## 2018-12-18 NOTE — Progress Notes (Signed)
Tele-visit via video  Patient has given verbal permission to conduct this visit via virtual appointment and to bill insurance 12/19/2018 9:00     Date:  12/19/2018   ID:  Kimberly Lyons, DOB [birth year], MRN [id number]  Patient location: Home Provider location: Home office  PCP:  Rutherford Guys, MD  Cardiologist:  Minus Breeding, MD  Electrophysiologist:  None   Evaluation Performed:  Follow up  Chief Complaint:  Cardiomyopathy  History of Present Illness:    Kimberly Lyons is a 58 y.o. female for ongoing assessment and management of systolic CHF. Echocardiogram revealed EF of 30% with severe LVH and global hypokineses. She was placed on Entresto and is now at 49/51 mg BID, continued on Coreg but increased to 12.5 mg BID, continued on lasix 20 mg, potassium.   A follow up Lexiscan Myoview was to be ordered and have MRI due to LVH on follow up.   She is doing quite well today. She has lost another 10 lbs, is walking a mile everyday, denies chest pain or excessive dyspnea. She states that she can climb stairs in her home without having to stop. She is medically compliant. She is ready to move forward with ischemic testing.   The patient does not have symptoms concerning for COVID-19 infection (fever, chills, cough, or new shortness of breath).    Past Medical History:  Diagnosis Date  . Acute systolic HF (heart failure) ([place])   . Hyperglycemia   . Morbid obesity ([place])    No past surgical history on file.   Current Meds  Medication Sig  . carvedilol (COREG) 12.5 MG tablet Take 1 tablet (12.5 mg total) by mouth 2 (two) times daily with a meal.  . furosemide (LASIX) 20 MG tablet Take 20 mg by mouth as needed.  . potassium chloride (K-DUR) 10 MEQ tablet Take 10 mEq by mouth as needed.  . [DISCONTINUED] furosemide (LASIX) 20 MG tablet Take 1 tablet (20 mg total) by mouth daily for 30 days. (Patient taking differently: Take 20 mg by mouth as needed. )  . [DISCONTINUED] potassium  chloride (K-DUR) 10 MEQ tablet Take 1 tablet (10 mEq total) by mouth daily for 30 days. (Patient taking differently: Take 10 mEq by mouth as needed. )  . [DISCONTINUED] sacubitril-valsartan (ENTRESTO) 49-51 MG Take 1 tablet by mouth 2 (two) times daily.     Allergies:   Patient has no known allergies.   Social History   Tobacco Use  . Smoking status: Never Smoker  . Smokeless tobacco: Never Used  Substance Use Topics  . Alcohol use: No  . Drug use: No     Family Hx: The patient's family history includes Cancer in her father and sister; Diabetes in her sister; Heart attack (age of onset: 75) in her mother; Hyperlipidemia in her mother and sister; Hypertension in her father and mother.  ROS:   Please see the history of present illness.    All other systems reviewed and are negative.   Prior CV studies:   The following studies were reviewed today:  Echocardiogram 08/29/2018  1. Severe dyskinesis of the left ventricular, basal-mid anteroseptal wall. 2. The left ventricle has moderate-severely reduced systolic function, with an ejection fraction of 30-35%. The cavity size was normal. There is severely increased left ventricular wall thickness. Left ventricular diastolic Doppler parameters are  consistent with impaired relaxation. 3. The right ventricle has normal systolic function. The cavity was normal. There is no increase in right ventricular wall  thickness. 4. The mitral valve is abnormal. Moderate thickening of the mitral valve leaflet. Mild calcification of the mitral valve leaflet. 5. The aortic root and ascending aorta are normal in size and structure. 6. The interatrial septum was not assessed.  Other studies Reviewed: Additional studies/ records that were reviewed today include: None Review of the above records demonstrates:      Labs/Other Tests and Data Reviewed:    EKG:    Recent Labs: 07/28/2018: B Natriuretic Peptide 463.1; Hemoglobin 14.3; Platelets 288  09/19/2018: ALT 19; TSH 1.900 11/18/2018: BUN 15; Creatinine, Ser 0.93; Potassium 4.6; Sodium 137   Recent Lipid Panel Lab Results  Component Value Date/Time   CHOL 149 09/19/2018 09:18 AM   TRIG 141 09/19/2018 09:18 AM   HDL 36 (L) 09/19/2018 09:18 AM   CHOLHDL 4.1 09/19/2018 09:18 AM   LDLCALC 85 09/19/2018 09:18 AM    Wt Readings from Last 3 Encounters:  12/19/18 241 lb (109.3 kg)  11/18/18 249 lb (112.9 kg)  10/18/18 257 lb 12.8 oz (116.9 kg)     Objective:    Vital Signs:  BP 121/66   Pulse 64   Ht 5\' 9"  (1.753 m)   Wt 241 lb (109.3 kg)   SpO2 97%   BMI 35.59 kg/m    General: Awake alert oriented no acute distress HEENT: Normocephalic and atraumatic Respirations: Normal inspiratory expiratory effort, no wheezing no coughing Neuro: No focal deficits Psych: Normal affect, responds easily to questions.  Able to verbalize well  ASSESSMENT & PLAN:    1.  Chronic systolic heart failure: Most recent echocardiogram April 2020 with EF of 30% with severe LVH and global hypokinesis.  The patient is tolerating medications well and is compliant.  Will increase Entresto to 97 102 twice daily, continue carvedilol 12.5 mg twice daily as heart rate is well controlled.  She has no evidence of volume overload, is able to walk a mile each day albeit slowly, without having to stop without significant dyspnea.  We will plan a Lexiscan Myoview as recommended by Dr. Antoine PocheHochrein to evaluate for ischemia causing reduced EF.  She will need to have a follow-up echocardiogram after stress Myoview if she does not have any perfusion defects.  Dr. Antoine PocheHochrein also recommended a cardiac MRI.  Will discuss at next office visit.  2.  Hypertension: Controlled although not optimal for reduced EF.  Increasing Entresto as discussed.  3.  Obesity: She has lost approximately 50 pounds, and losing approximately 10 pounds of month.  She is on a low-sodium low-cholesterol diet.  She is trying to remain active and is  walking each day.  She is dedicated to her weight loss regimen.   COVID-19 Education: The signs and symptoms of COVID-19 were discussed with the patient and how to seek care for testing (follow up with PCP or arrange E-visit).  The importance of social distancing was discussed today.  Time:   Today, I have spent 25 minutes with the patient with telehealth technology discussing the above problems.     Medication Adjustments/Labs and Tests Ordered: Current medicines are reviewed at length with the patient today.  Concerns regarding medicines are outlined above.   Tests Ordered: Orders Placed This Encounter  Procedures  . MYOCARDIAL PERFUSION IMAGING    Medication Changes: Meds ordered this encounter  Medications  . sacubitril-valsartan (ENTRESTO) 97-103 MG    Sig: Take 1 tablet by mouth 2 (two) times daily.    Dispense:  60 tablet    Refill:  11    Disposition:  Follow up 3 to 4 weeks  Signed, Bettey MareKathryn M. Liborio NixonLawrence DNP, ANP, AACC  12/19/2018 10:24 AM    [facility]

## 2018-12-19 ENCOUNTER — Encounter: Payer: Self-pay | Admitting: Cardiology

## 2018-12-19 ENCOUNTER — Telehealth (INDEPENDENT_AMBULATORY_CARE_PROVIDER_SITE_OTHER): Payer: Managed Care, Other (non HMO) | Admitting: Adult Health

## 2018-12-19 ENCOUNTER — Other Ambulatory Visit: Payer: Self-pay

## 2018-12-19 VITALS — BP 121/66 | HR 64 | Ht 69.0 in | Wt 241.0 lb

## 2018-12-19 DIAGNOSIS — I10 Essential (primary) hypertension: Secondary | ICD-10-CM

## 2018-12-19 DIAGNOSIS — I5022 Chronic systolic (congestive) heart failure: Secondary | ICD-10-CM

## 2018-12-19 DIAGNOSIS — I519 Heart disease, unspecified: Secondary | ICD-10-CM

## 2018-12-19 DIAGNOSIS — R0600 Dyspnea, unspecified: Secondary | ICD-10-CM

## 2018-12-19 MED ORDER — SACUBITRIL-VALSARTAN 97-103 MG PO TABS: 1.0000 | ORAL_TABLET | Freq: Two times a day (BID) | ORAL | 11 refills | Status: DC

## 2018-12-19 NOTE — Patient Instructions (Addendum)
Medication Instructions:  INCREASE- Entresto 100/97 mg by mouth twice a day  If you need a refill on your cardiac medications before your next appointment, please call your pharmacy.  Labwork: None Ordered   Testing/Procedures: Your physician has requested that you have a lexiscan myoview. For further information please visit [email or web address]. Please follow instruction sheet, as given.   Follow-Up: . Your physician recommends that you schedule a follow-up appointment in: 2-3 weeks with Dr Percival Spanish   At [facility], you and your health needs are our priority.  As part of our continuing mission to provide you with exceptional heart care, we have created designated Provider Care Teams.  These Care Teams include your primary Cardiologist (physician) and Advanced Practice Providers (APPs -  Physician Assistants and Nurse Practitioners) who all work together to provide you with the care you need, when you need it.  Thank you for choosing [facility] at [facility]!!

## 2018-12-27 ENCOUNTER — Telehealth (HOSPITAL_COMMUNITY): Payer: Self-pay

## 2018-12-27 NOTE — Telephone Encounter (Signed)
Encounter complete. 

## 2019-01-01 ENCOUNTER — Ambulatory Visit (HOSPITAL_COMMUNITY)
Admission: RE | Admit: 2019-01-01 | Discharge: 2019-01-01 | Disposition: A | Discharge status: Home / Self Care | Payer: Managed Care, Other (non HMO) | Source: Ambulatory Visit | Attending: Cardiology | Admitting: Cardiology

## 2019-01-01 ENCOUNTER — Other Ambulatory Visit: Payer: Self-pay

## 2019-01-01 DIAGNOSIS — R0600 Dyspnea, unspecified: Secondary | ICD-10-CM | POA: Diagnosis present

## 2019-01-01 DIAGNOSIS — I519 Heart disease, unspecified: Secondary | ICD-10-CM | POA: Diagnosis present

## 2019-01-01 MED ORDER — AMINOPHYLLINE 25 MG/ML IV SOLN
75.0000 mg | Freq: Once | INTRAVENOUS | Status: AC
  Administered 2019-01-01: 75 mg via INTRAVENOUS

## 2019-01-01 MED ORDER — REGADENOSON 0.4 MG/5ML IV SOLN
0.4000 mg | Freq: Once | INTRAVENOUS | Status: AC
  Administered 2019-01-01: 0.4 mg via INTRAVENOUS

## 2019-01-01 MED ORDER — TECHNETIUM TC 99M TETROFOSMIN IV KIT
29.5000 | PACK | Freq: Once | INTRAVENOUS | Status: AC | PRN
  Administered 2019-01-01: 29.5 via INTRAVENOUS
  Filled 2019-01-01: qty 30

## 2019-01-02 ENCOUNTER — Ambulatory Visit (HOSPITAL_COMMUNITY)
Admission: RE | Admit: 2019-01-02 | Discharge: 2019-01-02 | Disposition: A | Discharge status: Home / Self Care | Payer: Managed Care, Other (non HMO) | Source: Ambulatory Visit | Attending: Cardiovascular Disease | Admitting: Cardiovascular Disease

## 2019-01-02 LAB — MYOCARDIAL PERFUSION IMAGING
Peak HR: 94 {beats}/min
Rest HR: 55 {beats}/min
SDS: 3
SRS: 8
SSS: 11
TID: 0.84

## 2019-01-02 MED ORDER — TECHNETIUM TC 99M TETROFOSMIN IV KIT
30.8000 | PACK | Freq: Once | INTRAVENOUS | Status: AC | PRN
  Administered 2019-01-02: 30.8 via INTRAVENOUS

## 2019-01-12 NOTE — Progress Notes (Signed)
Cardiology Office Note   Date:  01/15/2019   ID:  Kimberly Lyons, DOB 03-17-1961, MRN [id number]  PCP:  Rutherford Guys, MD  Cardiologist:   Minus Breeding, MD   Chief Complaint  Patient presents with  . Cardiomyopathy     History of Present Illness: Kimberly Lyons is a 58 y.o. female who presents for follow up of CHF.    The patient was admitted on 3/31.  She had acute respiratory failure.  She was treated for acute CHF but an echo was not done.  I "saw" her with a tele and sent her for an echo with results below.  She was found to have an EF of 30% with severe LVH and global hypokinesis.  We have continued to titrate meds.  At the last visit we increased the Entresto.    Since I last saw her she has done well.  She tolerated her med changes.  She is walking for exercise.  The patient denies any new symptoms such as chest discomfort, neck or arm discomfort. There has been no new shortness of breath, PND or orthopnea. There have been no reported palpitations, presyncope or syncope.   Past Medical History:  Diagnosis Date  . Acute systolic HF (heart failure) ([place])   . Hyperglycemia   . Morbid obesity ([place])     History reviewed. No pertinent surgical history.   Current Outpatient Medications  Medication Sig Dispense Refill  . carvedilol (COREG) 12.5 MG tablet Take 1 tablet (12.5 mg total) by mouth 2 (two) times daily with a meal. 180 tablet 3  . furosemide (LASIX) 20 MG tablet Take 20 mg by mouth as needed.    . potassium chloride (K-DUR) 10 MEQ tablet Take 10 mEq by mouth as needed.    . sacubitril-valsartan (ENTRESTO) 97-103 MG Take 1 tablet by mouth 2 (two) times daily. 60 tablet 11  . carvedilol (COREG) 6.25 MG tablet Take 1 tablet (6.25 mg total) by mouth 2 (two) times daily. 180 tablet 3   No current facility-administered medications for this visit.     Allergies:   Patient has no known allergies.   ROS:  Please see the history of present illness.   Otherwise,  review of systems are positive for none.   All other systems are reviewed and negative.    PHYSICAL EXAM: VS:  BP 112/82   Pulse 67   Temp 97.6 F (36.4 C)   Ht 5\' 9"  (1.753 m)   Wt 240 lb (108.9 kg)   SpO2 98%   BMI 35.44 kg/m  , BMI Body mass index is 35.44 kg/m. GENERAL:  Well appearing NECK:  No jugular venous distention, waveform within normal limits, carotid upstroke brisk and symmetric, no bruits, no thyromegaly LUNGS:  Clear to auscultation bilaterally CHEST:  Unremarkable HEART:  PMI not displaced or sustained,S1 and S2 within normal limits, no S3, no S4, no clicks, no rubs, no murmurs ABD:  Flat, positive bowel sounds normal in frequency in pitch, no bruits, no rebound, no guarding, no midline pulsatile mass, no hepatomegaly, no splenomegaly EXT:  2 plus pulses throughout, no edema, no cyanosis no clubbing  EKG:  EKG is not  ordered today. Sinus rhythm, rate 65, axis within normal limits, intervals within normal limits, premature ventricular contractions.   Recent Labs: 07/28/2018: B Natriuretic Peptide 463.1; Hemoglobin 14.3; Platelets 288 09/19/2018: ALT 19; TSH 1.900 11/18/2018: BUN 15; Creatinine, Ser 0.93; Potassium 4.6; Sodium 137    Lipid Panel  Component Value Date/Time   CHOL 149 09/19/2018 0918   TRIG 141 09/19/2018 0918   HDL 36 (L) 09/19/2018 0918   CHOLHDL 4.1 09/19/2018 0918   LDLCALC 85 09/19/2018 0918      Wt Readings from Last 3 Encounters:  01/13/19 240 lb (108.9 kg)  01/01/19 241 lb (109.3 kg)  12/19/18 241 lb (109.3 kg)     ECHO:      1. Severe dyskinesis of the left ventricular, basal-mid anteroseptal wall.  2. The left ventricle has moderate-severely reduced systolic function, with an ejection fraction of 30-35%. The cavity size was normal. There is severely increased left ventricular wall thickness. Left ventricular diastolic Doppler parameters are  consistent with impaired relaxation.  3. The right ventricle has normal systolic  function. The cavity was normal. There is no increase in right ventricular wall thickness.  4. The mitral valve is abnormal. Moderate thickening of the mitral valve leaflet. Mild calcification of the mitral valve leaflet.  5. The aortic root and ascending aorta are normal in size and structure.  6. The interatrial septum was not assessed.  Other studies Reviewed: Additional studies/ records that were reviewed today include: None Review of the above records demonstrates:   NA   ASSESSMENT AND PLAN:  ACUTE HF:    Her EF is reduced .  She tolerated med titration.  Will increase her carvedilol to 18.75 mg twice daily in the next step will be 25 mg twice daily.   HTN: This is being managed in the context of treating his CHF  AKI:   Creat was back to baseline at the last check.  No change in therapy.   HYPERGLYCEMIA:     LDL was 6.1.  No change in therapy.   MORBID OBEISTY:    I am very very proud of her weight loss.   Current medicines are reviewed at length with the patient today.  The patient does not have concerns regarding medicines.  The following changes have been made:  As above  Labs/ tests ordered today include: None  No orders of the defined types were placed in this encounter.    Disposition:   FU me in 4 weeks.      Signed, Rollene RotundaJames Raydell Maners, MD  01/15/2019 12:49 PM    [facility]

## 2019-01-13 ENCOUNTER — Other Ambulatory Visit: Payer: Self-pay

## 2019-01-13 ENCOUNTER — Ambulatory Visit (INDEPENDENT_AMBULATORY_CARE_PROVIDER_SITE_OTHER): Payer: Managed Care, Other (non HMO) | Admitting: Cardiology

## 2019-01-13 ENCOUNTER — Encounter: Payer: Self-pay | Admitting: Cardiology

## 2019-01-13 VITALS — BP 112/82 | HR 67 | Temp 97.6°F | Ht 69.0 in | Wt 240.0 lb

## 2019-01-13 DIAGNOSIS — I5021 Acute systolic (congestive) heart failure: Secondary | ICD-10-CM | POA: Diagnosis not present

## 2019-01-13 DIAGNOSIS — I10 Essential (primary) hypertension: Secondary | ICD-10-CM

## 2019-01-13 DIAGNOSIS — E785 Hyperlipidemia, unspecified: Secondary | ICD-10-CM

## 2019-01-13 MED ORDER — CARVEDILOL 12.5 MG PO TABS: 12.5000 mg | ORAL_TABLET | Freq: Two times a day (BID) | ORAL | 3 refills | Status: DC

## 2019-01-13 MED ORDER — CARVEDILOL 6.25 MG PO TABS: 6.2500 mg | ORAL_TABLET | Freq: Two times a day (BID) | ORAL | 3 refills | Status: DC

## 2019-01-13 NOTE — Patient Instructions (Signed)
Medication Instructions:  Take one of 12.75mg  pill and a 6.25mg  pill twice daily.   If you need a refill on your cardiac medications before your next appointment, please call your pharmacy.   Lab work: NONE If you have labs (blood work) drawn today and your tests are completely normal, you will receive your results only by: [place] (if you have MyChart) OR A paper copy in the mail If you have any lab test that is abnormal or we need to change your treatment, we will call you to review the results.  Testing/Procedures: NONE  Follow-Up: At [facility], you and your health needs are our priority.  As part of our continuing mission to provide you with exceptional heart care, we have created designated Provider Care Teams.  These Care Teams include your primary Cardiologist (physician) and Advanced Practice Providers (APPs -  Physician Assistants and Nurse Practitioners) who all work together to provide you with the care you need, when you need it. You will need a follow up appointment in 1 months.  Please call our office 2 months in advance to schedule this appointment.  You may see Minus Breeding, MD or one of the following Advanced Practice Providers on your designated Care Team:   Rosaria Ferries, PA-C Jory Sims, DNP, ANP

## 2019-01-15 ENCOUNTER — Encounter: Payer: Self-pay | Admitting: Cardiology

## 2019-02-12 DIAGNOSIS — R739 Hyperglycemia, unspecified: Secondary | ICD-10-CM | POA: Insufficient documentation

## 2019-02-12 NOTE — Progress Notes (Signed)
Cardiology Office Note   Date:  02/14/2019   ID:  Kimberly Lyons, Kimberly Lyons 19-Apr-1961, MRN [id number]  PCP:  Rutherford Guys, MD  Cardiologist:   Minus Breeding, MD   Chief Complaint  Patient presents with  . Cardiomyopathy     History of Present Illness: Kimberly Lyons is a 58 y.o. female who presents for follow up of CHF.    The patient was admitted on 3/31.  She had acute respiratory failure.  She was treated for acute CHF but an echo was not done.  I "saw" her with a tele and sent her for an echo with results below.  She was found to have an EF of 30% with severe LVH and global hypokinesis.  We have continued to titrate meds.  At the last visit we increased the Coreg.    She continues to have tremendous weight loss with diet and exercise.  She feels well.  She tolerated increased dose of carvedilol with a little lightheadedness to begin with. The patient denies any new symptoms such as chest discomfort, neck or arm discomfort. There has been no new shortness of breath, PND or orthopnea. There have been no reported palpitations, presyncope or syncope.   Past Medical History:  Diagnosis Date  . Acute systolic HF (heart failure) ([place])   . Hyperglycemia   . Morbid obesity ([place])     History reviewed. No pertinent surgical history.   Current Outpatient Medications  Medication Sig Dispense Refill  . carvedilol (COREG) 25 MG tablet Take 0.5 tablets (12.5 mg total) by mouth 2 (two) times daily with a meal. 180 tablet 3  . carvedilol (COREG) 6.25 MG tablet Take 1 tablet (6.25 mg total) by mouth 2 (two) times daily. 180 tablet 3  . furosemide (LASIX) 20 MG tablet Take 20 mg by mouth as needed.    . potassium chloride (K-DUR) 10 MEQ tablet Take 10 mEq by mouth as needed.    . sacubitril-valsartan (ENTRESTO) 97-103 MG Take 1 tablet by mouth 2 (two) times daily. 60 tablet 11   No current facility-administered medications for this visit.     Allergies:   Patient has no known allergies.    ROS:  Please see the history of present illness.   Otherwise, review of systems are positive for none.   All other systems are reviewed and negative.    PHYSICAL EXAM: VS:  BP (!) 126/52   Pulse 61   Temp (!) 97.1 F (36.2 C)   Ht 5\' 9"  (1.753 m)   Wt 234 lb 12.8 oz (106.5 kg)   SpO2 100%   BMI 34.67 kg/m  , BMI Body mass index is 34.67 kg/m. GENERAL:  Well appearing NECK:  No jugular venous distention, waveform within normal limits, carotid upstroke brisk and symmetric, no bruits, no thyromegaly LUNGS:  Clear to auscultation bilaterally CHEST:  Unremarkable HEART:  PMI not displaced or sustained,S1 and S2 within normal limits, no S3, no S4, no clicks, no rubs, no murmurs ABD:  Flat, positive bowel sounds normal in frequency in pitch, no bruits, no rebound, no guarding, no midline pulsatile mass, no hepatomegaly, no splenomegaly EXT:  2 plus pulses throughout, no edema, no cyanosis no clubbing   EKG:  EKG is notordered today.   Recent Labs: 07/28/2018: B Natriuretic Peptide 463.1; Hemoglobin 14.3; Platelets 288 09/19/2018: ALT 19; TSH 1.900 11/18/2018: BUN 15; Creatinine, Ser 0.93; Potassium 4.6; Sodium 137    Lipid Panel    Component Value  Date/Time   CHOL 149 09/19/2018 0918   TRIG 141 09/19/2018 0918   HDL 36 (L) 09/19/2018 0918   CHOLHDL 4.1 09/19/2018 0918   LDLCALC 85 09/19/2018 0918      Wt Readings from Last 3 Encounters:  02/14/19 234 lb 12.8 oz (106.5 kg)  01/13/19 240 lb (108.9 kg)  01/01/19 241 lb (109.3 kg)     ECHO:      1. Severe dyskinesis of the left ventricular, basal-mid anteroseptal wall.  2. The left ventricle has moderate-severely reduced systolic function, with an ejection fraction of 30-35%. The cavity size was normal. There is severely increased left ventricular wall thickness. Left ventricular diastolic Doppler parameters are  consistent with impaired relaxation.  3. The right ventricle has normal systolic function. The cavity was  normal. There is no increase in right ventricular wall thickness.  4. The mitral valve is abnormal. Moderate thickening of the mitral valve leaflet. Mild calcification of the mitral valve leaflet.  5. The aortic root and ascending aorta are normal in size and structure.  6. The interatrial septum was not assessed.  Other studies Reviewed: Additional studies/ records that were reviewed today include: None Review of the above records demonstrates:  NA   ASSESSMENT AND PLAN:  ACUTE HF:    Her EF is reduced as above.  Today I will increase the carvedilol to 25 mg twice daily.  Check an echocardiogram in December as we are nearing the end of med titration.  I might consider Spironolactone in the future.  She has class I symptoms.   HTN: This is been controlled in the context of managing her heart failure. rapy.   HYPERGLYCEMIA:     A1C was 6.1. No change  MORBID OBEISTY:  I am very proud of her weight loss and her controlled method of approaching this.   Current medicines are reviewed at length with the patient today.  The patient does not have concerns regarding medicines.  The following changes have been made:  As above.   Labs/ tests ordered today include:   Orders Placed This Encounter  Procedures  . ECHOCARDIOGRAM COMPLETE     Disposition:   FU me in December in person.   Signed, Rollene Rotunda, MD  02/14/2019 10:54 AM    [facility]

## 2019-02-14 ENCOUNTER — Other Ambulatory Visit: Payer: Self-pay

## 2019-02-14 ENCOUNTER — Ambulatory Visit (INDEPENDENT_AMBULATORY_CARE_PROVIDER_SITE_OTHER): Payer: Managed Care, Other (non HMO) | Admitting: Cardiology

## 2019-02-14 ENCOUNTER — Encounter: Payer: Self-pay | Admitting: Cardiology

## 2019-02-14 ENCOUNTER — Ambulatory Visit: Payer: Managed Care, Other (non HMO) | Admitting: Cardiology

## 2019-02-14 VITALS — BP 126/52 | HR 61 | Temp 97.1°F | Ht 69.0 in | Wt 234.8 lb

## 2019-02-14 DIAGNOSIS — R739 Hyperglycemia, unspecified: Secondary | ICD-10-CM

## 2019-02-14 DIAGNOSIS — N179 Acute kidney failure, unspecified: Secondary | ICD-10-CM

## 2019-02-14 DIAGNOSIS — I429 Cardiomyopathy, unspecified: Secondary | ICD-10-CM

## 2019-02-14 DIAGNOSIS — I10 Essential (primary) hypertension: Secondary | ICD-10-CM | POA: Diagnosis not present

## 2019-02-14 DIAGNOSIS — Z23 Encounter for immunization: Secondary | ICD-10-CM | POA: Diagnosis not present

## 2019-02-14 MED ORDER — CARVEDILOL 25 MG PO TABS: 12.5000 mg | ORAL_TABLET | Freq: Two times a day (BID) | ORAL | 3 refills | Status: DC

## 2019-02-14 NOTE — Patient Instructions (Signed)
Medication Instructions:  INCREASE CARVEDILOL 25MG  TWICE DAILY  If you need a refill on your cardiac medications before your next appointment, please call your pharmacy.  Testing/Procedures: Echocardiogram (IN [place]) - Your physician has requested that you have an echocardiogram. Echocardiography is a painless test that uses sound waves to create images of your heart. It provides your doctor with information about the size and shape of your heart and how well your heart's chambers and valves are working. This procedure takes approximately one hour. There are no restrictions for this procedure. This will be performed at our [facility] location - [address].  Follow-Up: At [facility], you and your health needs are our priority.  As part of our continuing mission to provide you with exceptional heart care, we have created designated Provider Care Teams.  These Care Teams include your primary Cardiologist (physician) and Advanced Practice Providers (APPs -  Physician Assistants and Nurse Practitioners) who all work together to provide you with the care you need, when you need it.  Your next appointment:   AFTER ECHO.   The format for your next appointment:   In Person  Provider:   You may see Minus Breeding, MD or one of the following Advanced Practice Providers on your designated Care Team:  Rosaria Ferries, PA-C  Jory Sims, DNP, ANP  Cadence Kathlen Mody, NP  Thank you for choosing [facility] at [facility]!!

## 2019-03-06 ENCOUNTER — Telehealth: Payer: Self-pay | Admitting: Cardiology

## 2019-03-06 MED ORDER — CARVEDILOL 25 MG PO TABS: 25.0000 mg | ORAL_TABLET | Freq: Two times a day (BID) | ORAL | 3 refills | Status: DC

## 2019-03-06 NOTE — Telephone Encounter (Signed)
Pt calling stating that her medication carvedilol 25 mg tablet was sent in wrong, with pt taking 1/2 tablet BID. Pt states that Dr. Percival Spanish increased her carvedilol 25 mg tablet, pt taking 1 tablet BID, this was not sent in. Please address

## 2019-03-06 NOTE — Telephone Encounter (Signed)
This should be 25 mg Coreg PO bid.

## 2019-03-06 NOTE — Telephone Encounter (Signed)
Fixed prescription per Dr Rosezella [place] request.

## 2019-04-04 ENCOUNTER — Ambulatory Visit (HOSPITAL_COMMUNITY): Payer: Managed Care, Other (non HMO) | Attending: Cardiology

## 2019-04-04 ENCOUNTER — Other Ambulatory Visit: Payer: Self-pay

## 2019-04-04 DIAGNOSIS — I429 Cardiomyopathy, unspecified: Secondary | ICD-10-CM | POA: Diagnosis not present

## 2019-04-10 ENCOUNTER — Ambulatory Visit: Payer: Managed Care, Other (non HMO) | Admitting: Cardiology

## 2019-04-13 NOTE — Progress Notes (Signed)
Virtual Visit via Telephone Note   This visit type was conducted due to national recommendations for restrictions regarding the COVID-19 Pandemic (e.g. social distancing) in an effort to limit this patient's exposure and mitigate transmission in our community.  Due to her co-morbid illnesses, this patient is at least at moderate risk for complications without adequate follow up.  This format is felt to be most appropriate for this patient at this time.  The patient did not have access to video technology/had technical difficulties with video requiring transitioning to audio format only (telephone).  All issues noted in this document were discussed and addressed.  No physical exam could be performed with this format.  Please refer to the patient's chart for her  consent to telehealth for [facility].    Date:  04/14/2019   ID:  [name], DOB 02-12-1961, MRN [id number]  Patient Location: Home Provider Location: Home  PCP:  Myles Lipps, MD  Cardiologist:  Rollene Rotunda, MD  Electrophysiologist:  None   Evaluation Performed:  Follow-Up Visit  Chief Complaint:  Cardiomyopathy  History of Present Illness:    Kimberly Lyons is a 58 y.o. female with who presents for follow up of CHF.    The patient was admitted on 3/31.She had acute respiratory failure. She was treated for acute CHF but an echo was not done.  I "saw" her with a tele and sent her for an echo with results below.  She was found to have an EF of 30% with severe LVH and global hypokinesis.  We have continued to titrate meds.  At the last visit we increased the Coreg.  She had a follow up echo with an EF up to 45 - 50%.    At the last visit I increased the Coreg.   She has done well with this.  The patient denies any new symptoms such as chest discomfort, neck or arm discomfort. There has been no new shortness of breath, PND or orthopnea. There have been no reported palpitations, presyncope or syncope.     The patient  does not have symptoms concerning for COVID-19 infection (fever, chills, cough, or new shortness of breath).    Past Medical History:  Diagnosis Date  . Acute systolic HF (heart failure) ([facility])   . Hyperglycemia   . Morbid obesity ([facility])    No past surgical history on file.   Prior to Admission medications   Medication Sig Start Date End Date Taking? Authorizing Provider  carvedilol (COREG) 25 MG tablet Take 1 tablet (25 mg total) by mouth 2 (two) times daily with a meal. 03/06/19 04/05/19  Rollene Rotunda, MD  furosemide (LASIX) 20 MG tablet Take 20 mg by mouth as needed.    [provider]  potassium chloride (K-DUR) 10 MEQ tablet Take 10 mEq by mouth as needed.    [provider]  sacubitril-valsartan (ENTRESTO) 97-103 MG Take 1 tablet by mouth 2 (two) times daily. 12/19/18   Jodelle Gross, NP    Allergies:   Patient has no known allergies.   Social History   Tobacco Use  . Smoking status: Never Smoker  . Smokeless tobacco: Never Used  Substance Use Topics  . Alcohol use: No  . Drug use: No     Family Hx: The patient's family history includes Cancer in her father and sister; Diabetes in her sister; Heart attack (age of onset: 12) in her mother; Hyperlipidemia in her mother and sister; Hypertension in her father  and mother.  ROS:   Please see the history of present illness.     All other systems reviewed and are negative.   Prior CV studies:   The following studies were reviewed today:    Labs/Other Tests and Data Reviewed:    EKG:  No ECG reviewed.  Recent Labs: 07/28/2018: B Natriuretic Peptide 463.1; Hemoglobin 14.3; Platelets 288 09/19/2018: ALT 19; TSH 1.900 11/18/2018: BUN 15; Creatinine, Ser 0.93; Potassium 4.6; Sodium 137   Recent Lipid Panel Lab Results  Component Value Date/Time   CHOL 149 09/19/2018 09:18 AM   TRIG 141 09/19/2018 09:18 AM   HDL 36 (L) 09/19/2018 09:18 AM   CHOLHDL 4.1 09/19/2018 09:18 AM   LDLCALC 85 09/19/2018  09:18 AM    Wt Readings from Last 3 Encounters:  04/14/19 235 lb 12.8 oz (107 kg)  02/14/19 234 lb 12.8 oz (106.5 kg)  01/13/19 240 lb (108.9 kg)     Objective:    Vital Signs:  BP 124/76   Pulse 60   Wt 235 lb 12.8 oz (107 kg)   BMI 34.82 kg/m    VITAL SIGNS:  reviewed  ASSESSMENT & PLAN:    ACUTE HF:    No ejection fraction is much improved.  She is on target therapy although I might add spironolactone in the future.  I will do this now during the height of the pandemic as this would require frequent blood draw.  She will continue the meds as listed.   HTN:  This is being managed in the context of treating her heart failure.   MORBID OBEISTY: She has had tremendous weight loss but this has been plateaued.  She will continue to work on this.  I am very proud of of her progress to date.  COVID-19 Education: The signs and symptoms of COVID-19 were discussed with the patient and how to seek care for testing (follow up with PCP or arrange E-visit).  We talked about the vaccine.  She is going to wait a little while to receive this.  The importance of social distancing was discussed today.  Time:   Today, I have spent 16 minutes with the patient with telehealth technology discussing the above problems.     Medication Adjustments/Labs and Tests Ordered: Current medicines are reviewed at length with the patient today.  Concerns regarding medicines are outlined above.   Tests Ordered: No orders of the defined types were placed in this encounter.   Medication Changes: No orders of the defined types were placed in this encounter.   Follow Up:  In Person in  months   Signed, Minus Breeding, MD  04/14/2019 1:49 PM    [facility]

## 2019-04-14 ENCOUNTER — Encounter: Payer: Self-pay | Admitting: Cardiology

## 2019-04-14 ENCOUNTER — Telehealth (INDEPENDENT_AMBULATORY_CARE_PROVIDER_SITE_OTHER): Payer: Managed Care, Other (non HMO) | Admitting: Cardiology

## 2019-04-14 ENCOUNTER — Telehealth: Payer: Self-pay

## 2019-04-14 VITALS — BP 124/76 | HR 60 | Wt 235.8 lb

## 2019-04-14 DIAGNOSIS — I10 Essential (primary) hypertension: Secondary | ICD-10-CM

## 2019-04-14 DIAGNOSIS — R739 Hyperglycemia, unspecified: Secondary | ICD-10-CM | POA: Diagnosis not present

## 2019-04-14 DIAGNOSIS — I5021 Acute systolic (congestive) heart failure: Secondary | ICD-10-CM | POA: Diagnosis not present

## 2019-04-14 NOTE — Patient Instructions (Signed)
Medication Instructions:  Your physician recommends that you continue on your current medications as directed. Please refer to the Current Medication list given to you today.  *If you need a refill on your cardiac medications before your next appointment, please call your pharmacy*  Lab Work: NONE If you have labs (blood work) drawn today and your tests are completely normal, you will receive your results only by: [place] MyChart Message (if you have MyChart) OR . A paper copy in the mail If you have any lab test that is abnormal or we need to change your treatment, we will call you to review the results.  Testing/Procedures: NONE  Follow-Up: At [facility], you and your health needs are our priority.  As part of our continuing mission to provide you with exceptional heart care, we have created designated Provider Care Teams.  These Care Teams include your primary Cardiologist (physician) and Advanced Practice Providers (APPs -  Physician Assistants and Nurse Practitioners) who all work together to provide you with the care you need, when you need it.  Your next appointment:   3 month(s)  The format for your next appointment:   In Person  Provider:   Minus Breeding, MD

## 2019-04-14 NOTE — Telephone Encounter (Signed)
Left message regarding 3 mos f/u. Advised to call back with questions or concerns

## 2019-05-12 ENCOUNTER — Telehealth (INDEPENDENT_AMBULATORY_CARE_PROVIDER_SITE_OTHER): Payer: Managed Care, Other (non HMO) | Admitting: Registered Nurse

## 2019-05-12 ENCOUNTER — Encounter: Payer: Self-pay | Admitting: Registered Nurse

## 2019-05-12 ENCOUNTER — Other Ambulatory Visit: Payer: Self-pay

## 2019-05-12 VITALS — Temp 97.0°F

## 2019-05-12 DIAGNOSIS — J0100 Acute maxillary sinusitis, unspecified: Secondary | ICD-10-CM

## 2019-05-12 MED ORDER — AFRIN NASAL SPRAY 0.05 % NA SOLN: 1.0000 | Freq: Two times a day (BID) | NASAL | 0 refills | Status: DC

## 2019-05-12 MED ORDER — AMOXICILLIN 500 MG PO CAPS: 500.0000 mg | ORAL_CAPSULE | Freq: Three times a day (TID) | ORAL | 0 refills | Status: DC

## 2019-05-12 MED ORDER — FLUTICASONE PROPIONATE 50 MCG/ACT NA SUSP: 2.0000 | Freq: Every day | NASAL | 6 refills | Status: DC

## 2019-05-12 NOTE — Progress Notes (Signed)
Telemedicine Encounter- SOAP NOTE Established Patient  This telephone encounter was conducted with the patient's (or proxy's) verbal consent via audio telecommunications: yes  Patient was instructed to have this encounter in a suitably private space; and to only have persons present to whom they give permission to participate. In addition, patient identity was confirmed by use of name plus two identifiers (DOB and address).  I discussed the limitations, risks, security and privacy concerns of performing an evaluation and management service by telephone and the availability of in person appointments. I also discussed with the patient that there may be a patient responsible charge related to this service. The patient expressed understanding and agreed to proceed.  I spent a total of 11 minutes talking with the patient or their proxy.  No chief complaint on file.   Subjective   Kimberly Lyons is a 59 y.o. established patient. Telephone visit today for sinusitis  HPI Onset 4 days ago. Worsening. Sinus pressure and pain now extending to the L ear. No drainage or change in hearing in L ear. Some coughing. Lots of rhinorrhea. Some blood streaked in mucus. Sinus headache. Denies cough, shob, changes to senses, nvd, fever, fatigue, chills, known sick contacts. Has happened before, is always bacterial.  Patient Active Problem List   Diagnosis Date Noted  . Hyperglycemia 02/12/2019  . Morbid obesity (HCC) 02/12/2019  . AKI (acute kidney injury) (HCC) 09/05/2018  . Medication management 09/05/2018  . Educated about COVID-19 virus infection 08/27/2018  . Essential hypertension 08/27/2018  . BMI 45.0-49.9, adult (HCC) 08/26/2018  . Prediabetes 08/26/2018  . Heart failure (HCC) 07/28/2018  . Acute heart failure (HCC) 07/28/2018    Past Medical History:  Diagnosis Date  . Acute systolic HF (heart failure) (HCC)   . Hyperglycemia   . Morbid obesity (HCC)     Current Outpatient  Medications  Medication Sig Dispense Refill  . carvedilol (COREG) 25 MG tablet Take 1 tablet (25 mg total) by mouth 2 (two) times daily with a meal. 180 tablet 3  . sacubitril-valsartan (ENTRESTO) 97-103 MG Take 1 tablet by mouth 2 (two) times daily. 60 tablet 11  . amoxicillin (AMOXIL) 500 MG capsule Take 1 capsule (500 mg total) by mouth 3 (three) times daily. 15 capsule 0  . carvedilol (COREG) 6.25 MG tablet Take 1 tablet (6.25 mg total) by mouth 2 (two) times daily. (Patient not taking: Reported on 05/12/2019) 180 tablet 3  . fluticasone (FLONASE) 50 MCG/ACT nasal spray Place 2 sprays into both nostrils daily. 16 g 6  . furosemide (LASIX) 20 MG tablet Take 20 mg by mouth as needed.    Marland Kitchen oxymetazoline (AFRIN NASAL SPRAY) 0.05 % nasal spray Place 1 spray into both nostrils 2 (two) times daily. 30 mL 0  . potassium chloride (K-DUR) 10 MEQ tablet Take 10 mEq by mouth as needed.     No current facility-administered medications for this visit.    No Known Allergies  Social History   Socioeconomic History  . Marital status: Widowed    Spouse name: n/a  . Number of children: 1  . Years of education: 73  . Highest education level: Not on file  Occupational History  . 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 Smoking status: Never Smoker  . Smokeless tobacco: Never Used  Substance and Sexual Activity  . Alcohol use: No  . Drug use: No  . Sexual activity: Never  Other Topics Concern  .  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  Her daughter lives with her.   Social Determinants of Health   Financial Resource Strain:   . Difficulty of Paying Living Expenses: Not on file  Food Insecurity:   . Worried About Charity fundraiser in the Last Year: Not on file  . Ran Out of Food in the Last Year: Not on file  Transportation Needs:   . Lack of Transportation (Medical): Not on file  . Lack of Transportation  (Non-Medical): Not on file  Physical Activity:   . Days of Exercise per Week: Not on file  . Minutes of Exercise per Session: Not on file  Stress:   . Feeling of Stress : Not on file  Social Connections:   . Frequency of Communication with Friends and Family: Not on file  . Frequency of Social Gatherings with Friends and Family: Not on file  . Attends Religious Services: Not on file  . Active Member of Clubs or Organizations: Not on file  . Attends Archivist Meetings: Not on file  . Marital Status: Not on file  Intimate Partner Violence:   . Fear of Current or Ex-Partner: Not on file  . Emotionally Abused: Not on file  . Physically Abused: Not on file  . Sexually Abused: Not on file    ROS Per hpi   Objective   Vitals as reported by the patient: Today's Vitals   05/12/19 1124  Temp: (!) 97 F (36.1 C)  TempSrc: Oral    Diagnoses and all orders for this visit:  Acute non-recurrent maxillary sinusitis -     amoxicillin (AMOXIL) 500 MG capsule; Take 1 capsule (500 mg total) by mouth 3 (three) times daily. -     fluticasone (FLONASE) 50 MCG/ACT nasal spray; Place 2 sprays into both nostrils daily. -     oxymetazoline (AFRIN NASAL SPRAY) 0.05 % nasal spray; Place 1 spray into both nostrils 2 (two) times daily.   PLAN  Amoxicillin  Fluticasone and afrin to use sparingly  Saline wash suggested  Return to clinic if symptoms worsen or fail to improve  Patient encouraged to call clinic with any questions, comments, or concerns.   I discussed the assessment and treatment plan with the patient. The patient was provided an opportunity to ask questions and all were answered. The patient agreed with the plan and demonstrated an understanding of the instructions.   The patient was advised to call back or seek an in-person evaluation if the symptoms worsen or if the condition fails to improve as anticipated.  I provided 12 minutes of non-face-to-face time during  this encounter.  Maximiano Coss, NP  Primary Care at [facility]

## 2019-05-12 NOTE — Progress Notes (Signed)
Sinus infection that is going to the left ear.   Pressure under left eye and slight HA on left temple  Thick mucus out the nose. Not green  Thinning blood in the muscus. Going on since Friday 05/09/2019 Worse last night.

## 2019-05-12 NOTE — Patient Instructions (Signed)
° ° ° °  If you have lab work done today you will be contacted with your lab results within the next 2 weeks.  If you have not heard from us then please contact us. The fastest way to get your results is to register for My Chart. ° ° °IF you received an x-ray today, you will receive an invoice from [facility]. Please contact [facility] at [phone] with questions or concerns regarding your invoice.  ° °IF you received labwork today, you will receive an invoice from [facility]. Please contact [facility] at [phone] with questions or concerns regarding your invoice.  ° °Our billing staff will not be able to assist you with questions regarding bills from these companies. ° °You will be contacted with the lab results as soon as they are available. The fastest way to get your results is to activate your My Chart account. Instructions are located on the last page of this paperwork. If you have not heard from us regarding the results in 2 weeks, please contact this office. °  ° ° ° °

## 2019-07-16 DIAGNOSIS — I5022 Chronic systolic (congestive) heart failure: Secondary | ICD-10-CM | POA: Insufficient documentation

## 2019-07-16 NOTE — Progress Notes (Signed)
Cardiology Office Note   Date:  07/17/2019   ID:  Kimberly Lyons, DOB [birth year], MRN [id number]  PCP:  Kimberly Guys, MD  Cardiologist:   Kimberly Breeding, MD   Chief Complaint  Patient presents with  . Cardiomyopathy      History of Present Illness: Kimberly Lyons is a 59 y.o. female  who presents for follow up of CHF.  The patient was admitted on 3/31.She had acute respiratory failure. She was treated for acute CHF but an echo was not done.  She was found to have an EF of 30% with severe LVH and global hypokinesis.  We have continued to titrate meds.  At the last visit we increased the Coreg.  She had a follow up echo with an EF up to 45 - 50%.    She is done okay.  She is put on a little weight over the winter.  However, she feels okay. The patient denies any new symptoms such as chest discomfort, neck or arm discomfort. There has been no new shortness of breath, PND or orthopnea. There have been no reported palpitations, presyncope or syncope.     Past Medical History:  Diagnosis Date  . Acute systolic HF (heart failure) ([place])   . Hyperglycemia   . Morbid obesity ([place])     History reviewed. No pertinent surgical history.   Current Outpatient Medications  Medication Sig Dispense Refill  . carvedilol (COREG) 12.5 MG tablet Take 12.5 mg by mouth 2 (two) times daily.    . fluticasone (FLONASE) 50 MCG/ACT nasal spray Place 2 sprays into both nostrils daily. 16 g 6  . furosemide (LASIX) 20 MG tablet Take 20 mg by mouth as needed.    Marland Kitchen KLOR-CON M10 10 MEQ tablet Take 10 mEq by mouth daily.    . potassium chloride (K-DUR) 10 MEQ tablet Take 10 mEq by mouth as needed.    . sacubitril-valsartan (ENTRESTO) 97-103 MG Take 1 tablet by mouth 2 (two) times daily. 60 tablet 11  . spironolactone (ALDACTONE) 25 MG tablet Take 1 tablet (25 mg total) by mouth daily. 30 tablet 3   No current facility-administered medications for this visit.    Allergies:   Patient has no known  allergies.    ROS:  Please see the history of present illness.   Otherwise, review of systems are positive for none.   All other systems are reviewed and negative.    PHYSICAL EXAM: VS:  BP 128/78   Pulse (!) 58   Temp (!) 97.2 F (36.2 C)   Ht 5\' 9"  (1.753 m)   Wt 246 lb (111.6 kg)   SpO2 98%   BMI 36.33 kg/m  , BMI Body mass index is 36.33 kg/m. GENERAL:  Well appearing NECK:  No jugular venous distention, waveform within normal limits, carotid upstroke brisk and symmetric, no bruits, no thyromegaly LUNGS:  Clear to auscultation bilaterally CHEST:  Unremarkable HEART:  PMI not displaced or sustained,S1 and S2 within normal limits, no S3, no S4, no clicks, no rubs, no murmurs ABD:  Flat, positive bowel sounds normal in frequency in pitch, no bruits, no rebound, no guarding, no midline pulsatile mass, no hepatomegaly, no splenomegaly EXT:  2 plus pulses throughout, no edema, no cyanosis no clubbing   EKG:  EKG is ordered today. The ekg ordered today demonstrates sinus rhythm, rate 58, premature ventricular contractions, no acute ST-T wave changes.   Recent Labs: 07/28/2018: B Natriuretic Peptide 463.1; Hemoglobin 14.3;  Platelets 288 09/19/2018: ALT 19; TSH 1.900 11/18/2018: BUN 15; Creatinine, Ser 0.93; Potassium 4.6; Sodium 137    Lipid Panel    Component Value Date/Time   CHOL 149 09/19/2018 0918   TRIG 141 09/19/2018 0918   HDL 36 (L) 09/19/2018 0918   CHOLHDL 4.1 09/19/2018 0918   LDLCALC 85 09/19/2018 0918      Wt Readings from Last 3 Encounters:  07/17/19 246 lb (111.6 kg)  04/14/19 235 lb 12.8 oz (107 kg)  02/14/19 234 lb 12.8 oz (106.5 kg)      Other studies Reviewed: Additional studies/ records that were reviewed today include: None. Review of the above records demonstrates:  NA   ASSESSMENT AND PLAN:  CHRONIC SYSTOLIC HF:   Today I am going to add spironolactone 25 mg daily.  I will follow guidelines for spironolactone follow up in heart failure.   (Check potassium levels and  renal function 3--4 days and 1 week, then at least monthly for first 3 months and every 3 months thereafter after initiation of spironolactone.)   HTN:  This is being managed in the context of treating her heart failure.   MORBID OBEISTY:  She has gained a little weight back but she is committed to continuing to be on a weight loss journey.   COVID EDUCATION: She received dose one of her vaccine.   Current medicines are reviewed at length with the patient today.  The patient does not have concerns regarding medicines.  The following changes have been made:  no change  Labs/ tests ordered today include: None  Orders Placed This Encounter  Procedures  . Basic metabolic panel  . Basic metabolic panel  . Basic metabolic panel  . EKG 12-Lead     Disposition:   FU with me in 3 months.     Signed, Rollene Rotunda, MD  07/17/2019 12:16 PM    [facility]

## 2019-07-17 ENCOUNTER — Other Ambulatory Visit: Payer: Self-pay

## 2019-07-17 ENCOUNTER — Encounter: Payer: Self-pay | Admitting: Cardiology

## 2019-07-17 ENCOUNTER — Ambulatory Visit (INDEPENDENT_AMBULATORY_CARE_PROVIDER_SITE_OTHER): Payer: Managed Care, Other (non HMO) | Admitting: Cardiology

## 2019-07-17 VITALS — BP 128/78 | HR 58 | Temp 97.2°F | Ht 69.0 in | Wt 246.0 lb

## 2019-07-17 DIAGNOSIS — I5022 Chronic systolic (congestive) heart failure: Secondary | ICD-10-CM | POA: Diagnosis not present

## 2019-07-17 DIAGNOSIS — I10 Essential (primary) hypertension: Secondary | ICD-10-CM

## 2019-07-17 DIAGNOSIS — Z7189 Other specified counseling: Secondary | ICD-10-CM

## 2019-07-17 MED ORDER — SPIRONOLACTONE 25 MG PO TABS: 25.0000 mg | ORAL_TABLET | Freq: Every day | ORAL | 3 refills | Status: DC

## 2019-07-17 NOTE — Patient Instructions (Signed)
Medication Instructions:  START Spironolactone 25 mg once daily  *If you need a refill on your cardiac medications before your next appointment, please call your pharmacy*   Lab Work: Your provider would like for you to have the following labs today: BMET  Dr. Antoine Poche would like for you to have another BMET in 3 days and then again in 7 days.  If you have labs (blood work) drawn today and your tests are completely normal, you will receive your results only by: Marland Kitchen MyChart Message (if you have MyChart) OR . A paper copy in the mail If you have any lab test that is abnormal or we need to change your treatment, we will call you to review the results.   Testing/Procedures: None ordered   Follow-Up: At [facility], you and your health needs are our priority.  As part of our continuing mission to provide you with exceptional heart care, we have created designated Provider Care Teams.  These Care Teams include your primary Cardiologist (physician) and Advanced Practice Providers (APPs -  Physician Assistants and Nurse Practitioners) who all work together to provide you with the care you need, when you need it.  We recommend signing up for the patient portal called "MyChart".  Sign up information is provided on this After Visit Summary.  MyChart is used to connect with patients for Virtual Visits (Telemedicine).  Patients are able to view lab/test results, encounter notes, upcoming appointments, etc.  Non-urgent messages can be sent to your provider as well.   To learn more about what you can do with MyChart, go to [email or web address].    Your next appointment:   3 month(s)  The format for your next appointment:   In Person  Provider:   Rollene Rotunda, MD

## 2019-07-18 LAB — BASIC METABOLIC PANEL
BUN/Creatinine Ratio: 18 (ref 12–28)
BUN: 17 mg/dL (ref 8–27)
CO2: 19 mmol/L — ABNORMAL LOW (ref 20–29)
Calcium: 9.5 mg/dL (ref 8.7–10.3)
Chloride: 104 mmol/L (ref 96–106)
Creatinine, Ser: 0.93 mg/dL (ref 0.57–1.00)
GFR calc Af Amer: 77 mL/min/{1.73_m2} (ref >59)
GFR calc non Af Amer: 67 mL/min/{1.73_m2} (ref >59)
Glucose: 89 mg/dL (ref 65–99)
Potassium: 5.3 mmol/L — ABNORMAL HIGH (ref 3.5–5.2)
Sodium: 144 mmol/L (ref 134–144)

## 2019-07-21 ENCOUNTER — Other Ambulatory Visit: Payer: Self-pay

## 2019-07-21 DIAGNOSIS — I5022 Chronic systolic (congestive) heart failure: Secondary | ICD-10-CM

## 2019-07-21 LAB — BASIC METABOLIC PANEL
BUN/Creatinine Ratio: 21 (ref 12–28)
BUN: 20 mg/dL (ref 8–27)
CO2: 22 mmol/L (ref 20–29)
Calcium: 9.1 mg/dL (ref 8.7–10.3)
Chloride: 105 mmol/L (ref 96–106)
Creatinine, Ser: 0.95 mg/dL (ref 0.57–1.00)
GFR calc Af Amer: 75 mL/min/{1.73_m2} (ref >59)
GFR calc non Af Amer: 65 mL/min/{1.73_m2} (ref >59)
Glucose: 111 mg/dL — ABNORMAL HIGH (ref 65–99)
Potassium: 4.6 mmol/L (ref 3.5–5.2)
Sodium: 141 mmol/L (ref 134–144)

## 2019-07-21 NOTE — Addendum Note (Signed)
Addended by: Mirian Mo on: 07/21/2019 08:14 AM   Modules accepted: Orders

## 2019-07-24 LAB — BASIC METABOLIC PANEL
BUN/Creatinine Ratio: 27 (ref 12–28)
BUN: 24 mg/dL (ref 8–27)
CO2: 21 mmol/L (ref 20–29)
Calcium: 9.1 mg/dL (ref 8.7–10.3)
Chloride: 105 mmol/L (ref 96–106)
Creatinine, Ser: 0.88 mg/dL (ref 0.57–1.00)
GFR calc Af Amer: 82 mL/min/{1.73_m2} (ref >59)
GFR calc non Af Amer: 71 mL/min/{1.73_m2} (ref >59)
Glucose: 106 mg/dL — ABNORMAL HIGH (ref 65–99)
Potassium: 4.8 mmol/L (ref 3.5–5.2)
Sodium: 140 mmol/L (ref 134–144)

## 2019-08-02 IMAGING — DX PORTABLE CHEST - 1 VIEW
1 series · 1 of 1 positions shown · non-contrast
Comparison: Portable exam 5164 hours compared to 07/28/2018

CLINICAL DATA: Shortness of breath since [REDACTED] night

EXAM:
PORTABLE CHEST 1 VIEW

[chest ap]
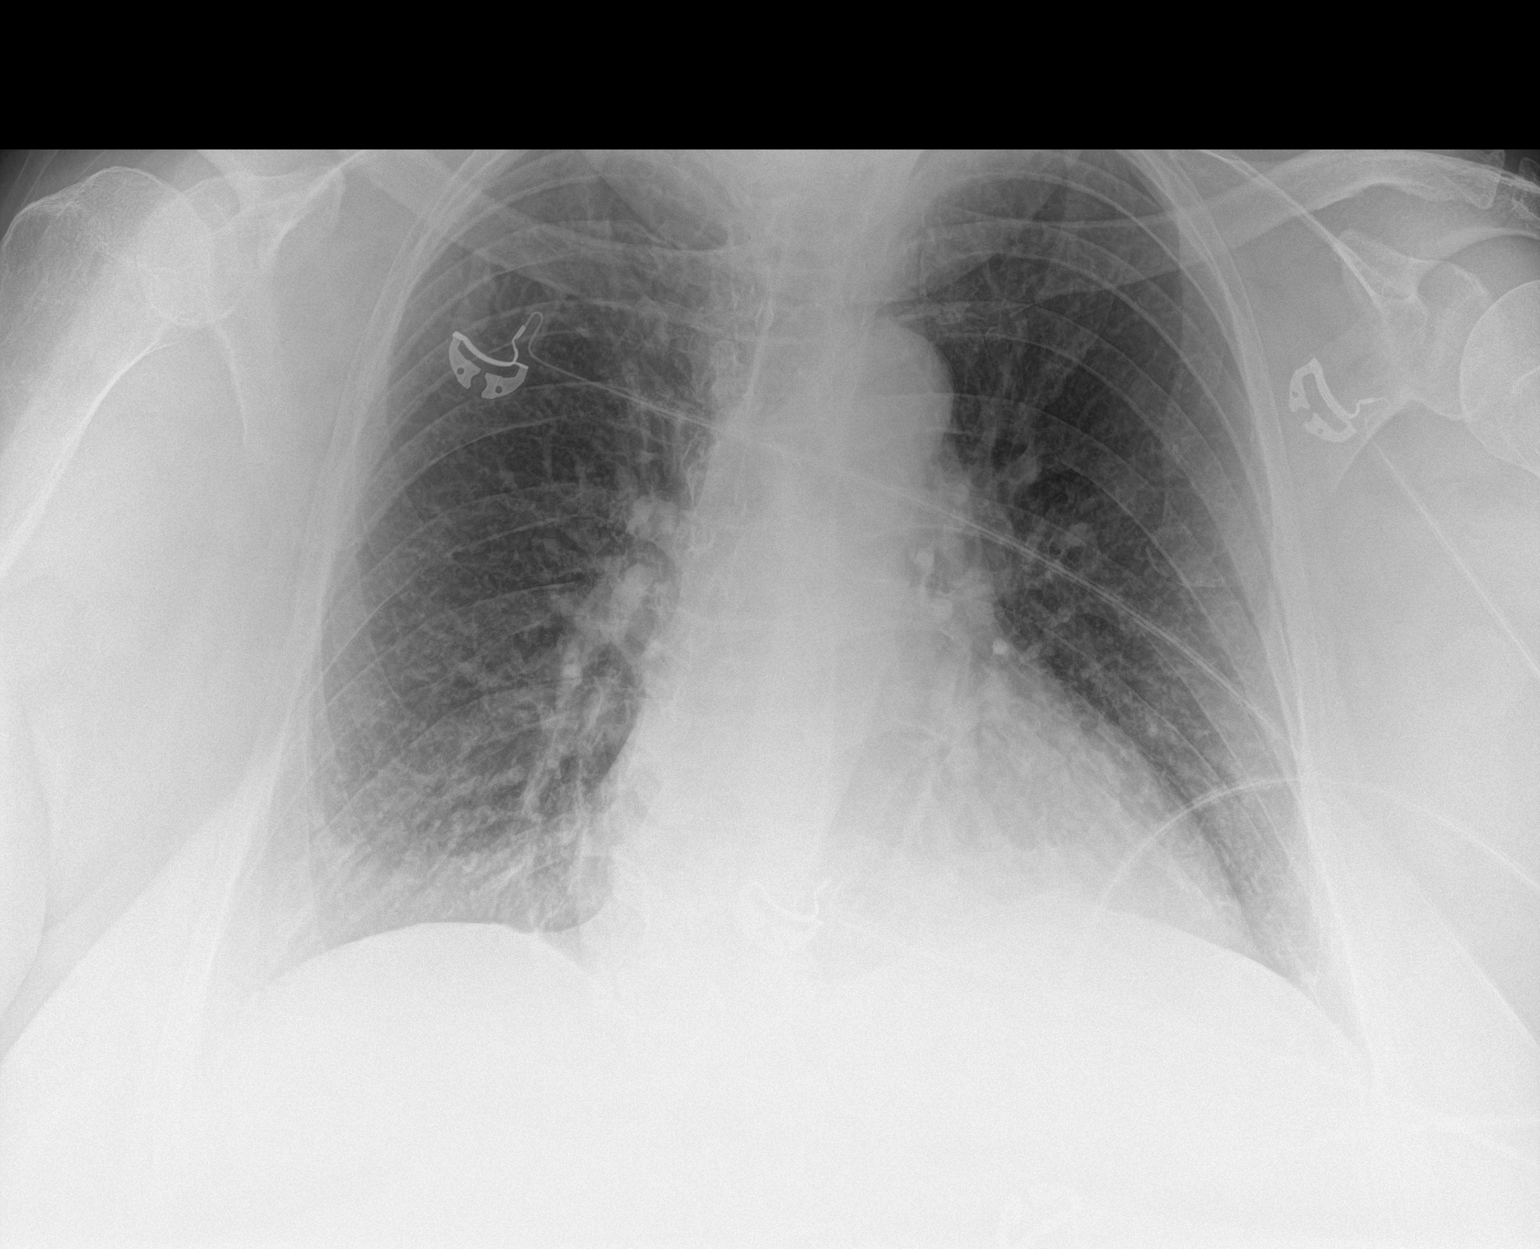

[1 of 1 positions shown; findings below may reference images not displayed]

FINDINGS: Borderline enlargement of cardiac silhouette.

Mediastinal contours and pulmonary vascularity normal.

Improved interstitial infiltrates/edema.

Mild bibasilar atelectasis.

Upper lungs clear.

No pleural effusion or pneumothorax.
IMPRESSION: Improved pulmonary edema.

Mild bibasilar atelectasis.

## 2019-08-04 ENCOUNTER — Telehealth: Payer: Self-pay | Admitting: Cardiology

## 2019-08-04 NOTE — Telephone Encounter (Signed)
Spoke with patient. Paperwork has been received. Dr. Antoine Poche to sign his portion tomorrow and paperwork will be faxed. Patient aware of status.

## 2019-08-04 NOTE — Telephone Encounter (Signed)
Paperwork information sent to primary nurse to advise if she has received it.

## 2019-08-04 NOTE — Telephone Encounter (Signed)
New Message    Pt is calling and says her and the Dr had to fill out a form for the medication Enteresto. She says she dropped it off 2 weeks ago today and she is wanting to know if it it has been faxed    Please advise

## 2019-08-08 ENCOUNTER — Ambulatory Visit: Payer: Managed Care, Other (non HMO) | Admitting: Cardiology

## 2019-08-29 ENCOUNTER — Telehealth: Payer: Self-pay | Admitting: Cardiology

## 2019-08-29 NOTE — Telephone Encounter (Signed)
  Pt is calling to follow up mychart messages. advised her the nurse reached out with Dr. Antoine Poche and she is just waiting for his verification, she said she will just wait for the nurse to respond on her mychart

## 2019-09-01 MED ORDER — CARVEDILOL 25 MG PO TABS: 25.0000 mg | ORAL_TABLET | Freq: Two times a day (BID) | ORAL | 3 refills | Status: DC

## 2019-09-11 ENCOUNTER — Other Ambulatory Visit: Payer: Self-pay | Admitting: Cardiology

## 2019-10-13 ENCOUNTER — Other Ambulatory Visit: Payer: Self-pay

## 2019-10-13 DIAGNOSIS — Z79899 Other long term (current) drug therapy: Secondary | ICD-10-CM

## 2019-10-13 NOTE — Progress Notes (Signed)
BMP added for spironolactone management. Last bmp was March 2021. Patient presented to lab requesting lab work be completed.

## 2019-10-14 LAB — BASIC METABOLIC PANEL
BUN/Creatinine Ratio: 26 (ref 12–28)
BUN: 23 mg/dL (ref 8–27)
CO2: 20 mmol/L (ref 20–29)
Calcium: 9.2 mg/dL (ref 8.7–10.3)
Chloride: 102 mmol/L (ref 96–106)
Creatinine, Ser: 0.89 mg/dL (ref 0.57–1.00)
GFR calc Af Amer: 81 mL/min/{1.73_m2} (ref >59)
GFR calc non Af Amer: 70 mL/min/{1.73_m2} (ref >59)
Glucose: 76 mg/dL (ref 65–99)
Potassium: 5.2 mmol/L (ref 3.5–5.2)
Sodium: 139 mmol/L (ref 134–144)

## 2019-10-16 NOTE — Progress Notes (Signed)
Cardiology Office Note   Date:  10/17/2019   ID:  GWYNN Lyons, DOB 1961-04-07, MRN [id number]  PCP:  Rutherford Guys, MD  Cardiologist:   Minus Breeding, MD   No chief complaint on file.     History of Present Illness: Kimberly Lyons is a 59 y.o. female  who presents for follow up of CHF.  The patient was admitted on 3/31.She had acute respiratory failure. She was treated for acute CHF but an echo was not done.  She was found to have an EF of 30% with severe LVH and global hypokinesis.  We have continued to titrate meds.  At the last visit we increased the Coreg.  She had a follow up echo with an EF up to 45 - 50%.    At the last visit I started spironolactone.  She did well with this.  The patient denies any new symptoms such as chest discomfort, neck or arm discomfort. There has been no new shortness of breath, PND or orthopnea. There have been no reported palpitations, presyncope or syncope.    Past Medical History:  Diagnosis Date  . Acute systolic HF (heart failure) ([place])   . Hyperglycemia   . Morbid obesity ([place])     No past surgical history on file.   Current Outpatient Medications  Medication Sig Dispense Refill  . carvedilol (COREG) 25 MG tablet Take 1 tablet (25 mg total) by mouth 2 (two) times daily. 180 tablet 3  . fluticasone (FLONASE) 50 MCG/ACT nasal spray Place 2 sprays into both nostrils daily. 16 g 6  . furosemide (LASIX) 20 MG tablet Take 20 mg by mouth as needed.    Marland Kitchen KLOR-CON M10 10 MEQ tablet Take 10 mEq by mouth daily.    . potassium chloride (K-DUR) 10 MEQ tablet Take 10 mEq by mouth as needed.    . sacubitril-valsartan (ENTRESTO) 97-103 MG Take 1 tablet by mouth 2 (two) times daily. 60 tablet 11  . spironolactone (ALDACTONE) 25 MG tablet Take 1 tablet (25 mg total) by mouth daily. 90 tablet 1   No current facility-administered medications for this visit.    Allergies:   Patient has no known allergies.    ROS:  Please see the history of  present illness.   Otherwise, review of systems are positive for none.   All other systems are reviewed and negative.    PHYSICAL EXAM: VS:  BP 102/70   Pulse 65   Ht 5\' 9"  (1.753 m)   Wt 247 lb 6.4 oz (112.2 kg)   SpO2 100%   BMI 36.53 kg/m  , BMI Body mass index is 36.53 kg/m. GENERAL:  Well appearing NECK:  No jugular venous distention, waveform within normal limits, carotid upstroke brisk and symmetric, no bruits, no thyromegaly LUNGS:  Clear to auscultation bilaterally CHEST:  Unremarkable HEART:  PMI not displaced or sustained,S1 and S2 within normal limits, no S3, no S4, no clicks, no rubs, no murmurs ABD:  Flat, positive bowel sounds normal in frequency in pitch, no bruits, no rebound, no guarding, no midline pulsatile mass, no hepatomegaly, no splenomegaly EXT:  2 plus pulses throughout, no edema, no cyanosis no clubbing   EKG:  EKG is not ordered today.   Recent Labs: 10/13/2019: BUN 23; Creatinine, Ser 0.89; Potassium 5.2; Sodium 139    Lipid Panel    Component Value Date/Time   CHOL 149 09/19/2018 0918   TRIG 141 09/19/2018 0918   HDL 36 (L)  09/19/2018 0918   CHOLHDL 4.1 09/19/2018 0918   LDLCALC 85 09/19/2018 0918      Wt Readings from Last 3 Encounters:  10/17/19 247 lb 6.4 oz (112.2 kg)  07/17/19 246 lb (111.6 kg)  04/14/19 235 lb 12.8 oz (107 kg)      Other studies Reviewed: Additional studies/ records that were reviewed today include: None. Review of the above records demonstrates:  NA   ASSESSMENT AND PLAN:  CHRONIC SYSTOLIC HF:   She is doing very well and is on optimal medical therapy.  I am going to stop her potassium supplement since she does take her as needed Lasix.  She rarely takes Lasix.  HTN:  This is at target.   MORBID OBEISTY:   I might consider semaglutide in the future for weight loss pending a little more data about cost.  COVID EDUCATION: She has had her vaccine.     Current medicines are reviewed at length with the  patient today.  The patient does not have concerns regarding medicines.  The following changes have been made:  no change  Labs/ tests ordered today include: None  No orders of the defined types were placed in this encounter.    Disposition:   FU with me in Jan after repeat echo.   Signed, Rollene Rotunda, MD  10/17/2019 8:09 AM    [facility]

## 2019-10-17 ENCOUNTER — Encounter: Payer: Self-pay | Admitting: Cardiology

## 2019-10-17 ENCOUNTER — Other Ambulatory Visit: Payer: Self-pay

## 2019-10-17 ENCOUNTER — Ambulatory Visit (INDEPENDENT_AMBULATORY_CARE_PROVIDER_SITE_OTHER): Payer: Managed Care, Other (non HMO) | Admitting: Cardiology

## 2019-10-17 VITALS — BP 102/70 | HR 65 | Ht 69.0 in | Wt 247.4 lb

## 2019-10-17 DIAGNOSIS — I10 Essential (primary) hypertension: Secondary | ICD-10-CM | POA: Diagnosis not present

## 2019-10-17 DIAGNOSIS — I5022 Chronic systolic (congestive) heart failure: Secondary | ICD-10-CM

## 2019-10-17 NOTE — Patient Instructions (Addendum)
Medication Instructions:  STOP POTASSIUM *If you need a refill on your cardiac medications before your next appointment, please call your pharmacy*  Lab Work: NONE ORDERED THIS VISIT  Testing/Procedures: Your physician has requested that you have an echocardiogram IN [place]. Echocardiography is a painless test that uses sound waves to create images of your heart. It provides your doctor with information about the size and shape of your heart and how well your hearts chambers and valves are working. This procedure takes approximately one hour. There are no restrictions for this procedure. [address]  Follow-Up: At [facility], you and your health needs are our priority.  As part of our continuing mission to provide you with exceptional heart care, we have created designated Provider Care Teams.  These Care Teams include your primary Cardiologist (physician) and Advanced Practice Providers (APPs -  Physician Assistants and Nurse Practitioners) who all work together to provide you with the care you need, when you need it.  Your next appointment:   7 month(s) IN January AFTER ECHO You will receive a reminder letter in the mail two months in advance. If you don't receive a letter, please call our office to schedule the follow-up appointment.  The format for your next appointment:   In Person  Provider:   Rollene Rotunda, MD

## 2020-01-11 ENCOUNTER — Other Ambulatory Visit: Payer: Self-pay | Admitting: Cardiology

## 2020-01-21 ENCOUNTER — Telehealth: Payer: Self-pay | Admitting: *Deleted

## 2020-01-21 NOTE — Telephone Encounter (Signed)
Schedule a mobile mammogram 9-27 @ [facility]

## 2020-04-07 MED ORDER — SPIRONOLACTONE 25 MG PO TABS: 25.0000 mg | ORAL_TABLET | Freq: Every day | ORAL | 1 refills | Status: DC

## 2020-05-07 ENCOUNTER — Ambulatory Visit (HOSPITAL_COMMUNITY): Payer: Managed Care, Other (non HMO) | Attending: Cardiovascular Disease

## 2020-05-07 ENCOUNTER — Other Ambulatory Visit: Payer: Self-pay

## 2020-05-07 DIAGNOSIS — I5022 Chronic systolic (congestive) heart failure: Secondary | ICD-10-CM | POA: Diagnosis present

## 2020-05-07 LAB — ECHOCARDIOGRAM COMPLETE
Area-P 1/2: 2.6 cm2
S' Lateral: 3.4 cm

## 2020-05-16 NOTE — Progress Notes (Signed)
Virtual Visit via Telephone Note   This visit type was conducted due to national recommendations for restrictions regarding the COVID-19 Pandemic (e.g. social distancing) in an effort to limit this patient's exposure and mitigate transmission in our community.  Due to her co-morbid illnesses, this patient is at least at moderate risk for complications without adequate follow up.  This format is felt to be most appropriate for this patient at this time.  The patient did not have access to video technology/had technical difficulties with video requiring transitioning to audio format only (telephone).  All issues noted in this document were discussed and addressed.  No physical exam could be performed with this format.  Please refer to the patient's chart for her  consent to telehealth for [facility].    Date:  05/17/2020   ID:  Kimberly Lyons, DOB 1960/05/23, MRN [id number] The patient was identified using 2 identifiers.  Patient Location: Home Provider Location: Home Office  PCP:  Lezlie Lye, Meda Coffee, MD  Cardiologist:  Rollene Rotunda, MD  Electrophysiologist:  None   Evaluation Performed:  Follow-Up Visit  Chief Complaint:  Cardiomyopathy  History of Present Illness:    Kimberly Lyons is a 60 y.o. female who presents for follow up of CHF.  The patient was admitted on 3/31.She had acute respiratory failure. She was treated for acute CHF but an echo was not done. She was found to have an EF of 30% with severe LVH and global hypokinesis. We have continued to titrate meds. At the last visit we increased the Coreg. She had a follow up echo with an EF back to normal earlier this week.  She is exercising 5 days per week.  The patient denies any new symptoms such as chest discomfort, neck or arm discomfort. There has been no new shortness of breath, PND or orthopnea. There have been no reported palpitations, presyncope or syncope.  She did not watch her salt intake as I would like over  the holidays and she did have to take a few days of Lasix but otherwise does not have to take it.    The patient does not have symptoms concerning for COVID-19 infection (fever, chills, cough, or new shortness of breath).    Past Medical History:  Diagnosis Date  . Acute systolic HF (heart failure) (HCC)   . Hyperglycemia   . Morbid obesity (HCC)    No past surgical history on file.   Current Meds  Medication Sig  . carvedilol (COREG) 25 MG tablet Take 1 tablet (25 mg total) by mouth 2 (two) times daily.  . fluticasone (FLONASE) 50 MCG/ACT nasal spray Place 2 sprays into both nostrils daily.  . furosemide (LASIX) 20 MG tablet Take 20 mg by mouth as needed.  . sacubitril-valsartan (ENTRESTO) 97-103 MG Take 1 tablet by mouth 2 (two) times daily.  Marland Kitchen spironolactone (ALDACTONE) 25 MG tablet Take 1 tablet (25 mg total) by mouth daily.     Allergies:   Patient has no known allergies.   Social History   Tobacco Use  . Smoking status: Never Smoker  . Smokeless tobacco: Never Used  Substance Use Topics  . Alcohol use: No  . Drug use: No     Family Hx: The patient's family history includes Cancer in her father and sister; Diabetes in her sister; Heart attack (age of onset: 3) in her mother; Hyperlipidemia in her mother and sister; Hypertension in her father and mother.  ROS:   Please see  the history of present illness.     All other systems reviewed and are negative.   Prior CV studies:   The following studies were reviewed today:  None  Labs/Other Tests and Data Reviewed:    EKG:  No ECG reviewed.  Recent Labs: 10/13/2019: BUN 23; Creatinine, Ser 0.89; Potassium 5.2; Sodium 139   Recent Lipid Panel Lab Results  Component Value Date/Time   CHOL 149 09/19/2018 09:18 AM   TRIG 141 09/19/2018 09:18 AM   HDL 36 (L) 09/19/2018 09:18 AM   CHOLHDL 4.1 09/19/2018 09:18 AM   LDLCALC 85 09/19/2018 09:18 AM    Wt Readings from Last 3 Encounters:  05/17/20 262 lb (118.8 kg)   10/17/19 247 lb 6.4 oz (112.2 kg)  07/17/19 246 lb (111.6 kg)     Risk Assessment/Calculations:      Objective:    Vital Signs:  BP 112/70   Pulse 97   Ht 5\' 9"  (1.753 m)   Wt 262 lb (118.8 kg)   BMI 38.69 kg/m    VITAL SIGNS:  reviewed  ASSESSMENT & PLAN:    CHRONIC SYSTOLIC HF: She has had a return to normal LV function.  No change in therapy.  I will check a BMET.    HTN: This is being managed in the context of treating his CHF  This is at target.   MORBID OBEISTY:    She has had some weight loss and should concentrate on diet and exercise.    COVID EDUCATION: She has had her vaccine and her booster.     Time:   Today, I have spent 16 minutes with the patient with telehealth technology discussing the above problems.     Medication Adjustments/Labs and Tests Ordered: Current medicines are reviewed at length with the patient today.  Concerns regarding medicines are outlined above.   Tests Ordered: No orders of the defined types were placed in this encounter.   Medication Changes: No orders of the defined types were placed in this encounter.   Follow Up:  In Person 12 months  Signed, , MD  05/17/2020 8:37 AM    [facility]

## 2020-05-17 ENCOUNTER — Encounter: Payer: Self-pay | Admitting: Cardiology

## 2020-05-17 ENCOUNTER — Telehealth (INDEPENDENT_AMBULATORY_CARE_PROVIDER_SITE_OTHER): Payer: Managed Care, Other (non HMO) | Admitting: Cardiology

## 2020-05-17 ENCOUNTER — Telehealth: Payer: Self-pay

## 2020-05-17 VITALS — BP 112/70 | HR 97 | Ht 69.0 in | Wt 262.0 lb

## 2020-05-17 DIAGNOSIS — I5022 Chronic systolic (congestive) heart failure: Secondary | ICD-10-CM | POA: Diagnosis not present

## 2020-05-17 NOTE — Telephone Encounter (Signed)
  Patient Consent for Virtual Visit         Kimberly Lyons has provided verbal consent on 05/17/2020 for a virtual visit (video or telephone).   CONSENT FOR VIRTUAL VISIT FOR:  Kimberly Lyons  By participating in this virtual visit I agree to the following:  I hereby voluntarily request, consent and authorize [facility] and its employed or contracted physicians, physician assistants, nurse practitioners or other licensed health care professionals (the Practitioner), to provide me with telemedicine health care services (the "Services") as deemed necessary by the treating Practitioner. I acknowledge and consent to receive the Services by the Practitioner via telemedicine. I understand that the telemedicine visit will involve communicating with the Practitioner through live audiovisual communication technology and the disclosure of certain medical information by electronic transmission. I acknowledge that I have been given the opportunity to request an in-person assessment or other available alternative prior to the telemedicine visit and am voluntarily participating in the telemedicine visit.  I understand that I have the right to withhold or withdraw my consent to the use of telemedicine in the course of my care at any time, without affecting my right to future care or treatment, and that the Practitioner or I may terminate the telemedicine visit at any time. I understand that I have the right to inspect all information obtained and/or recorded in the course of the telemedicine visit and may receive copies of available information for a reasonable fee.  I understand that some of the potential risks of receiving the Services via telemedicine include:  Marland Kitchen Delay or interruption in medical evaluation due to technological equipment failure or disruption; . Information transmitted may not be sufficient (e.g. poor resolution of images) to allow for appropriate medical decision making by the Practitioner;  and/or  . In rare instances, security protocols could fail, causing a breach of personal health information.  Furthermore, I acknowledge that it is my responsibility to provide information about my medical history, conditions and care that is complete and accurate to the best of my ability. I acknowledge that Practitioner's advice, recommendations, and/or decision may be based on factors not within their control, such as incomplete or inaccurate data provided by me or distortions of diagnostic images or specimens that may result from electronic transmissions. I understand that the practice of medicine is not an exact science and that Practitioner makes no warranties or guarantees regarding treatment outcomes. I acknowledge that a copy of this consent can be made available to me via my patient portal [facility] MyChart), or I can request a printed copy by calling the office of [facility].    I understand that my insurance will be billed for this visit.   I have read or had this consent read to me. . I understand the contents of this consent, which adequately explains the benefits and risks of the Services being provided via telemedicine.  . I have been provided ample opportunity to ask questions regarding this consent and the Services and have had my questions answered to my satisfaction. . I give my informed consent for the services to be provided through the use of telemedicine in my medical care

## 2020-05-17 NOTE — Patient Instructions (Signed)
Medication Instructions:  Your physician recommends that you continue on your current medications as directed. Please refer to the Current Medication list given to you today.  *If you need a refill on your cardiac medications before your next appointment, please call your pharmacy*  Lab Work: BMET SOON   If you have labs (blood work) drawn today and your tests are completely normal, you will receive your results only by: Marland Kitchen MyChart Message (if you have MyChart) OR . A paper copy in the mail If you have any lab test that is abnormal or we need to change your treatment, we will call you to review the results.  Testing/Procedures: NONE   Follow-Up: At [facility], you and your health needs are our priority.  As part of our continuing mission to provide you with exceptional heart care, we have created designated Provider Care Teams.  These Care Teams include your primary Cardiologist (physician) and Advanced Practice Providers (APPs -  Physician Assistants and Nurse Practitioners) who all work together to provide you with the care you need, when you need it.  We recommend signing up for the patient portal called "MyChart".  Sign up information is provided on this After Visit Summary.  MyChart is used to connect with patients for Virtual Visits (Telemedicine).  Patients are able to view lab/test results, encounter notes, upcoming appointments, etc.  Non-urgent messages can be sent to your provider as well.   To learn more about what you can do with MyChart, go to [email or web address].    Your next appointment:   12 MONTHS  You will receive a reminder letter in the mail two months in advance. If you don't receive a letter, please call our office to schedule the follow-up appointment.   The format for your next appointment:   In Person  Provider:   You may see Rollene Rotunda, MD or one of the following Advanced Practice Providers on your designated Care Team:    Corine Shelter,  PA-C  [place], [place]  Edd Fabian, [place]  Other Instructions  WILL LOOK INTO THE ENTRESTO PATIENT ASSISTANCE PROGRAM

## 2020-05-19 ENCOUNTER — Other Ambulatory Visit: Payer: Self-pay | Admitting: Adult Health

## 2020-05-20 ENCOUNTER — Other Ambulatory Visit: Payer: Self-pay | Admitting: *Deleted

## 2020-05-20 DIAGNOSIS — I5022 Chronic systolic (congestive) heart failure: Secondary | ICD-10-CM

## 2020-05-20 DIAGNOSIS — Z79899 Other long term (current) drug therapy: Secondary | ICD-10-CM

## 2020-05-21 LAB — BASIC METABOLIC PANEL
BUN/Creatinine Ratio: 25 (ref 12–28)
BUN: 23 mg/dL (ref 8–27)
CO2: 23 mmol/L (ref 20–29)
Calcium: 9.3 mg/dL (ref 8.7–10.3)
Chloride: 103 mmol/L (ref 96–106)
Creatinine, Ser: 0.93 mg/dL (ref 0.57–1.00)
GFR calc Af Amer: 77 mL/min/{1.73_m2} (ref >59)
GFR calc non Af Amer: 67 mL/min/{1.73_m2} (ref >59)
Glucose: 88 mg/dL (ref 65–99)
Potassium: 5.3 mmol/L — ABNORMAL HIGH (ref 3.5–5.2)
Sodium: 139 mmol/L (ref 134–144)

## 2020-05-24 ENCOUNTER — Other Ambulatory Visit: Payer: Self-pay | Admitting: *Deleted

## 2020-05-24 DIAGNOSIS — E875 Hyperkalemia: Secondary | ICD-10-CM

## 2020-05-24 DIAGNOSIS — I10 Essential (primary) hypertension: Secondary | ICD-10-CM

## 2020-09-30 ENCOUNTER — Other Ambulatory Visit: Payer: Self-pay | Admitting: Cardiology

## 2020-11-15 ENCOUNTER — Other Ambulatory Visit: Payer: Self-pay

## 2020-11-15 MED ORDER — CARVEDILOL 25 MG PO TABS: 25.0000 mg | ORAL_TABLET | Freq: Two times a day (BID) | ORAL | 3 refills | Status: DC

## 2021-02-14 ENCOUNTER — Other Ambulatory Visit: Payer: Self-pay

## 2021-02-15 MED ORDER — ENTRESTO 97-103 MG PO TABS: 1.0000 | ORAL_TABLET | Freq: Two times a day (BID) | ORAL | 4 refills | Status: DC

## 2021-03-26 ENCOUNTER — Other Ambulatory Visit: Payer: Self-pay | Admitting: Cardiology

## 2021-04-07 ENCOUNTER — Encounter: Payer: Self-pay | Admitting: Cardiology

## 2021-04-11 ENCOUNTER — Other Ambulatory Visit: Payer: Self-pay | Admitting: *Deleted

## 2021-04-11 MED ORDER — ENTRESTO 97-103 MG PO TABS: 1.0000 | ORAL_TABLET | Freq: Two times a day (BID) | ORAL | 3 refills | Status: DC

## 2021-05-12 ENCOUNTER — Encounter: Payer: Self-pay | Admitting: *Deleted

## 2021-05-13 ENCOUNTER — Ambulatory Visit: Payer: Managed Care, Other (non HMO) | Admitting: Cardiology

## 2021-05-25 NOTE — Progress Notes (Signed)
Cardiology Office Note   Date:  05/26/2021   ID:  Kimberly Lyons, DOB [birth year], MRN [id number]  PCP:  No primary care provider on file.  Cardiologist:   Rollene Rotunda, MD   Chief Complaint  Patient presents with   Cardiomyopathy      History of Present Illness: Kimberly Lyons is a 61 y.o. female who presents for follow up of CHF.  The patient was admitted on 07/30/2018.  She had acute respiratory failure.  She was treated for acute CHF.  She was found to have an EF of 30% with severe LVH and global hypokinesis.  We have continued to titrate meds.  At the last visit we increased the Coreg.  She had a follow up echo with an EF back to normal earlier this week.  Since I last saw her she has done well.  She is put on a little bit of weight.  She did have COVID at the beginning of this month.  She had cough and congestion.  However, otherwise she is felt quite well.  She still walks about 20 minutes most days of the week. The patient denies any new symptoms such as chest discomfort, neck or arm discomfort. There has been no new shortness of breath, PND or orthopnea. There have been no reported palpitations, presyncope or syncope.  Past Medical History:  Diagnosis Date   Acute systolic HF (heart failure) (HCC)    Hyperglycemia    Morbid obesity (HCC)     History reviewed. No pertinent surgical history.   Current Outpatient Medications  Medication Sig Dispense Refill   benzonatate (TESSALON) 100 MG capsule Take 100 mg by mouth 3 (three) times daily as needed.     carvedilol (COREG) 25 MG tablet Take 1 tablet (25 mg total) by mouth 2 (two) times daily. 180 tablet 3   chlorpheniramine-HYDROcodone (TUSSIONEX) 10-8 MG/5ML SUER SMARTSIG:5 Milliliter(s) By Mouth Every 12 Hours PRN     MAXITROL 3.5-10000-0.1 ophthalmic suspension Place 1 drop into both eyes 4 (four) times daily.     sacubitril-valsartan (ENTRESTO) 97-103 MG Take 1 tablet by mouth 2 (two) times daily. 180 tablet 3    spironolactone (ALDACTONE) 25 MG tablet TAKE 1 TABLET BY MOUTH EVERY DAY 90 tablet 0   fluticasone (FLONASE) 50 MCG/ACT nasal spray Place 2 sprays into both nostrils daily. (Patient not taking: Reported on 05/26/2021) 16 g 6   furosemide (LASIX) 20 MG tablet Take 20 mg by mouth as needed. (Patient not taking: Reported on 05/26/2021)     No current facility-administered medications for this visit.    Allergies:   Patient has no known allergies.    ROS:  Please see the history of present illness.   Otherwise, review of systems are positive for none.   All other systems are reviewed and negative.    PHYSICAL EXAM: VS:  BP 120/84 (BP Location: Left Arm, Patient Position: Sitting, Cuff Size: Large)    Pulse 77    Ht 5\' 9"  (1.753 m)    Wt 269 lb 3.2 oz (122.1 kg)    SpO2 98%    BMI 39.75 kg/m  , BMI Body mass index is 39.75 kg/m. GENERAL:  Well appearing NECK:  No jugular venous distention, waveform within normal limits, carotid upstroke brisk and symmetric, no bruits, no thyromegaly LUNGS:  Clear to auscultation bilaterally CHEST:  Unremarkable HEART:  PMI not displaced or sustained,S1 and S2 within normal limits, no S3, no S4, no clicks, no rubs,  no murmurs ABD:  Flat, positive bowel sounds normal in frequency in pitch, no bruits, no rebound, no guarding, no midline pulsatile mass, no hepatomegaly, no splenomegaly EXT:  2 plus pulses throughout, no edema, no cyanosis no clubbing   EKG:  EKG is ordered today. The ekg ordered today demonstrates normal sinus rhythm, rate 81, axis within normal limits, intervals within normal limits, premature ectopic complexes, poor anterior R wave progression, no acute ST-T wave changes.   Recent Labs: No results found for requested labs within last 8760 hours.    Lipid Panel    Component Value Date/Time   CHOL 149 09/19/2018 0918   TRIG 141 09/19/2018 0918   HDL 36 (L) 09/19/2018 0918   CHOLHDL 4.1 09/19/2018 0918   LDLCALC 85 09/19/2018 0918       Wt Readings from Last 3 Encounters:  05/26/21 269 lb 3.2 oz (122.1 kg)  05/17/20 262 lb (118.8 kg)  10/17/19 247 lb 6.4 oz (112.2 kg)      Other studies Reviewed: Additional studies/ records that were reviewed today include: None. Review of the above records demonstrates:  NA   ASSESSMENT AND PLAN:  CHRONIC SYSTOLIC HF:    She seems to be euvolemic.  Her echo was done last year with an EF of 60%.  No change in therapy and no further imaging.     HTN:  This is being managed in the context of treating her heart failure.  She is at target.    MORBID OBEISTY:   We talked about diet and exercise again today.  HEALTH MAINTENANCE: I will take the liberty of ordering some labs as she has not had them in a few years.  To get a basic metabolic profile, CBC, lipid profile and A1c.  Current medicines are reviewed at length with the patient today.  The patient does not have concerns regarding medicines.  The following changes have been made:  no change  Labs/ tests ordered today include:   Orders Placed This Encounter  Procedures   Basic metabolic panel   CBC   Lipid panel   Hemoglobin A1c   EKG 12-Lead     Disposition:   FU with me in one year.     Signed, Rollene Rotunda, MD  05/26/2021 8:44 AM    [facility]

## 2021-05-26 ENCOUNTER — Other Ambulatory Visit: Payer: Self-pay

## 2021-05-26 ENCOUNTER — Encounter: Payer: Self-pay | Admitting: Cardiology

## 2021-05-26 ENCOUNTER — Ambulatory Visit (INDEPENDENT_AMBULATORY_CARE_PROVIDER_SITE_OTHER): Payer: 59 | Admitting: Cardiology

## 2021-05-26 VITALS — BP 120/84 | HR 77 | Ht 69.0 in | Wt 269.2 lb

## 2021-05-26 DIAGNOSIS — I5022 Chronic systolic (congestive) heart failure: Secondary | ICD-10-CM | POA: Diagnosis not present

## 2021-05-26 DIAGNOSIS — I10 Essential (primary) hypertension: Secondary | ICD-10-CM | POA: Diagnosis not present

## 2021-05-26 DIAGNOSIS — R7303 Prediabetes: Secondary | ICD-10-CM

## 2021-05-26 LAB — CBC
Hematocrit: 41.4 % (ref 34.0–46.6)
Hemoglobin: 14.2 g/dL (ref 11.1–15.9)
MCH: 30.7 pg (ref 26.6–33.0)
MCHC: 34.3 g/dL (ref 31.5–35.7)
MCV: 90 fL (ref 79–97)
Platelets: 357 10*3/uL (ref 150–450)
RBC: 4.62 x10E6/uL (ref 3.77–5.28)
RDW: 12.8 % (ref 11.7–15.4)
WBC: 8.3 10*3/uL (ref 3.4–10.8)

## 2021-05-26 LAB — HEMOGLOBIN A1C
Est. average glucose Bld gHb Est-mCnc: 126 mg/dL
Hgb A1c MFr Bld: 6 % — ABNORMAL HIGH (ref 4.8–5.6)

## 2021-05-26 LAB — LIPID PANEL
Chol/HDL Ratio: 3.8 ratio (ref 0.0–4.4)
Cholesterol, Total: 149 mg/dL (ref 100–199)
HDL: 39 mg/dL — ABNORMAL LOW (ref >39)
LDL Chol Calc (NIH): 88 mg/dL (ref 0–99)
Triglycerides: 122 mg/dL (ref 0–149)
VLDL Cholesterol Cal: 22 mg/dL (ref 5–40)

## 2021-05-26 LAB — BASIC METABOLIC PANEL
BUN/Creatinine Ratio: 32 — ABNORMAL HIGH (ref 12–28)
BUN: 33 mg/dL — ABNORMAL HIGH (ref 8–27)
CO2: 23 mmol/L (ref 20–29)
Calcium: 9.4 mg/dL (ref 8.7–10.3)
Chloride: 107 mmol/L — ABNORMAL HIGH (ref 96–106)
Creatinine, Ser: 1.03 mg/dL — ABNORMAL HIGH (ref 0.57–1.00)
Glucose: 140 mg/dL — ABNORMAL HIGH (ref 70–99)
Potassium: 5 mmol/L (ref 3.5–5.2)
Sodium: 142 mmol/L (ref 134–144)
eGFR: 61 mL/min/{1.73_m2} (ref >59)

## 2021-05-26 NOTE — Patient Instructions (Addendum)
Medication Instructions:  Your Physician recommend you continue on your current medication as directed.    *If you need a refill on your cardiac medications before your next appointment, please call your pharmacy*   Lab Work: BMET, CBC, Lipids, A1C If you have labs (blood work) drawn today and your tests are completely normal, you will receive your results only by: MyChart Message (if you have MyChart) OR A paper copy in the mail If you have any lab test that is abnormal or we need to change your treatment, we will call you to review the results.   Follow-Up: At [facility], you and your health needs are our priority.  As part of our continuing mission to provide you with exceptional heart care, we have created designated Provider Care Teams.  These Care Teams include your primary Cardiologist (physician) and Advanced Practice Providers (APPs -  Physician Assistants and Nurse Practitioners) who all work together to provide you with the care you need, when you need it.  We recommend signing up for the patient portal called "MyChart".  Sign up information is provided on this After Visit Summary.  MyChart is used to connect with patients for Virtual Visits (Telemedicine).  Patients are able to view lab/test results, encounter notes, upcoming appointments, etc.  Non-urgent messages can be sent to your provider as well.   To learn more about what you can do with MyChart, go to [email or web address].    Your next appointment:   1 year(s)  The format for your next appointment:   In Person  Provider:   Rollene Rotunda, MD

## 2021-06-09 ENCOUNTER — Other Ambulatory Visit: Payer: Self-pay | Admitting: Cardiology

## 2021-08-13 ENCOUNTER — Other Ambulatory Visit: Payer: Self-pay | Admitting: Cardiology

## 2021-08-29 ENCOUNTER — Other Ambulatory Visit: Payer: Self-pay

## 2021-08-29 MED ORDER — SPIRONOLACTONE 25 MG PO TABS: 25.0000 mg | ORAL_TABLET | Freq: Every day | ORAL | 0 refills | Status: DC

## 2021-09-08 ENCOUNTER — Other Ambulatory Visit: Payer: Self-pay | Admitting: Cardiology

## 2021-12-22 ENCOUNTER — Other Ambulatory Visit: Payer: Self-pay | Admitting: Cardiology

## 2022-05-29 NOTE — Progress Notes (Unsigned)
Cardiology Office Note   Date:  05/30/2022   ID:  Kimberly Lyons, Kimberly Lyons 1961-01-23, MRN [id number]  PCP:  Patient, No Pcp Per  Cardiologist:   Minus Breeding, MD   Chief Complaint  Patient presents with   Cardiomyopathy      History of Present Illness: Kimberly Lyons is a 62 y.o. female who presents for follow up of CHF.  The patient was admitted on 07/30/2018.  She had acute respiratory failure.  She was treated for acute CHF.  She was found to have an EF of 30% with severe LVH and global hypokinesis.  We have continued to titrate meds.   We have been titrating meds.  She had a follow up echo with the EF returning to normal.    Since I last saw her she has had no new cardiovascular complaints.  She is limited by knee pain and would like referral to Ortho for this.  She still walks with a walking stick and can do about half mile a day. The patient denies any new symptoms such as chest discomfort, neck or arm discomfort. There has been no new shortness of breath, PND or orthopnea. There have been no reported palpitations, presyncope or syncope.    Past Medical History:  Diagnosis Date   Acute systolic HF (heart failure) ([place])    Hyperglycemia    Morbid obesity ([place])     History reviewed. No pertinent surgical history.   Current Outpatient Medications  Medication Sig Dispense Refill   carvedilol (COREG) 25 MG tablet TAKE 1 TABLET BY MOUTH TWICE A DAY 180 tablet 3   sacubitril-valsartan (ENTRESTO) 97-103 MG Take 1 tablet by mouth 2 (two) times daily. 180 tablet 3   spironolactone (ALDACTONE) 25 MG tablet TAKE 1 TABLET (25 MG TOTAL) BY MOUTH DAILY. 90 tablet 3   benzonatate (TESSALON) 100 MG capsule Take 100 mg by mouth 3 (three) times daily as needed. (Patient not taking: Reported on 05/30/2022)     chlorpheniramine-HYDROcodone (TUSSIONEX) 10-8 MG/5ML SUER SMARTSIG:5 Milliliter(s) By Mouth Every 12 Hours PRN (Patient not taking: Reported on 05/30/2022)     fluticasone (FLONASE) 50  MCG/ACT nasal spray Place 2 sprays into both nostrils daily. (Patient not taking: Reported on 05/26/2021) 16 g 6   furosemide (LASIX) 20 MG tablet Take 20 mg by mouth as needed. (Patient not taking: Reported on 05/26/2021)     MAXITROL 3.5-10000-0.1 ophthalmic suspension Place 1 drop into both eyes 4 (four) times daily. (Patient not taking: Reported on 05/30/2022)     No current facility-administered medications for this visit.    Allergies:   Patient has no known allergies.    ROS:  Please see the history of present illness.   Otherwise, review of systems are positive for none.   All other systems are reviewed and negative.    PHYSICAL EXAM: VS:  BP 118/72 (BP Location: Left Arm, Patient Position: Sitting, Cuff Size: Large)   Pulse 67   Ht 5\' 9"  (1.753 m)   Wt 277 lb 6.4 oz (125.8 kg)   SpO2 98%   BMI 40.96 kg/m  , BMI Body mass index is 40.96 kg/m. GENERAL:  Well appearing NECK:  No jugular venous distention, waveform within normal limits, carotid upstroke brisk and symmetric, no bruits, no thyromegaly LUNGS:  Clear to auscultation bilaterally CHEST:  Unremarkable HEART:  PMI not displaced or sustained,S1 and S2 within normal limits, no S3, no S4, no clicks, no rubs, no murmurs ABD:  Flat,  positive bowel sounds normal in frequency in pitch, no bruits, no rebound, no guarding, no midline pulsatile mass, no hepatomegaly, no splenomegaly EXT:  2 plus pulses throughout, no edema, no cyanosis no clubbing   EKG:  EKG is  ordered today. The ekg ordered today demonstrates normal sinus rhythm, rate 67, axis within normal limits, intervals within normal limits, premature ventricular complexes, poor anterior R wave progression, no acute ST-T wave changes.   Recent Labs: No results found for requested labs within last 365 days.    Lipid Panel    Component Value Date/Time   CHOL 149 05/26/2021 0845   TRIG 122 05/26/2021 0845   HDL 39 (L) 05/26/2021 0845   CHOLHDL 3.8 05/26/2021 0845    LDLCALC 88 05/26/2021 0845      Wt Readings from Last 3 Encounters:  05/30/22 277 lb 6.4 oz (125.8 kg)  05/26/21 269 lb 3.2 oz (122.1 kg)  05/17/20 262 lb (118.8 kg)      Other studies Reviewed: Additional studies/ records that were reviewed today include: Labs. Review of the above records demonstrates: CL swear   ASSESSMENT AND PLAN:  CHRONIC SYSTOLIC HF:    Her EF returned to normal.  No change in therapy.   HTN:  This is is being managed in the context of managing her heart failure.  No change in therapy.  ELEVATED BS: I will check an A1c today.     MORBID OBEISTY:   We have talked again about weight loss and increase physical activity.   KNEE PAIN: Refer to orthopedics  PVCs:   She does not feel these.  No change in therapy.    Current medicines are reviewed at length with the patient today.  The patient does not have concerns regarding medicines.  The following changes have been made:   None  Labs/ tests ordered today include:     Orders Placed This Encounter  Procedures   CBC   Basic Metabolic Panel (BMET)   TSH   HgB A1c   AMB referral to orthopedics   EKG 12-Lead     Disposition:   FU with me in one year.   Signed, Minus Breeding, MD  05/30/2022 10:13 AM    [facility]

## 2022-05-30 ENCOUNTER — Ambulatory Visit: Payer: 59 | Attending: Cardiology | Admitting: Cardiology

## 2022-05-30 ENCOUNTER — Encounter: Payer: Self-pay | Admitting: Cardiology

## 2022-05-30 VITALS — BP 118/72 | HR 67 | Ht 69.0 in | Wt 277.4 lb

## 2022-05-30 DIAGNOSIS — M25561 Pain in right knee: Secondary | ICD-10-CM | POA: Diagnosis not present

## 2022-05-30 DIAGNOSIS — I5022 Chronic systolic (congestive) heart failure: Secondary | ICD-10-CM | POA: Diagnosis not present

## 2022-05-30 DIAGNOSIS — I10 Essential (primary) hypertension: Secondary | ICD-10-CM | POA: Diagnosis not present

## 2022-05-30 DIAGNOSIS — R7303 Prediabetes: Secondary | ICD-10-CM

## 2022-05-30 DIAGNOSIS — M25562 Pain in left knee: Secondary | ICD-10-CM

## 2022-05-30 NOTE — Patient Instructions (Signed)
  Follow-Up: At [facility], you and your health needs are our priority.  As part of our continuing mission to provide you with exceptional heart care, we have created designated Provider Care Teams.  These Care Teams include your primary Cardiologist (physician) and Advanced Practice Providers (APPs -  Physician Assistants and Nurse Practitioners) who all work together to provide you with the care you need, when you need it.  We recommend signing up for the patient portal called "MyChart".  Sign up information is provided on this After Visit Summary.  MyChart is used to connect with patients for Virtual Visits (Telemedicine).  Patients are able to view lab/test results, encounter notes, upcoming appointments, etc.  Non-urgent messages can be sent to your provider as well.   To learn more about what you can do with MyChart, go to [email or web address].    Your next appointment:   12 month(s)  Provider:   Minus Breeding, MD     Other Instructions  Pricilla Holm MD OR Lyda Jester MD

## 2022-05-31 LAB — HEMOGLOBIN A1C
Est. average glucose Bld gHb Est-mCnc: 128 mg/dL
Hgb A1c MFr Bld: 6.1 % — ABNORMAL HIGH (ref 4.8–5.6)

## 2022-05-31 LAB — TSH: TSH: 1.52 u[IU]/mL (ref 0.450–4.500)

## 2022-05-31 LAB — CBC
Hematocrit: 43.3 % (ref 34.0–46.6)
Hemoglobin: 14.7 g/dL (ref 11.1–15.9)
MCH: 31.7 pg (ref 26.6–33.0)
MCHC: 33.9 g/dL (ref 31.5–35.7)
MCV: 93 fL (ref 79–97)
Platelets: 274 10*3/uL (ref 150–450)
RBC: 4.64 x10E6/uL (ref 3.77–5.28)
RDW: 12.9 % (ref 11.7–15.4)
WBC: 10.1 10*3/uL (ref 3.4–10.8)

## 2022-05-31 LAB — BASIC METABOLIC PANEL
BUN/Creatinine Ratio: 24 (ref 12–28)
BUN: 22 mg/dL (ref 8–27)
CO2: 20 mmol/L (ref 20–29)
Calcium: 9.5 mg/dL (ref 8.7–10.3)
Chloride: 104 mmol/L (ref 96–106)
Creatinine, Ser: 0.91 mg/dL (ref 0.57–1.00)
Glucose: 114 mg/dL — ABNORMAL HIGH (ref 70–99)
Potassium: 5.2 mmol/L (ref 3.5–5.2)
Sodium: 140 mmol/L (ref 134–144)
eGFR: 71 mL/min/{1.73_m2} (ref >59)

## 2022-06-18 ENCOUNTER — Other Ambulatory Visit: Payer: Self-pay | Admitting: Cardiology

## 2022-06-23 ENCOUNTER — Ambulatory Visit (INDEPENDENT_AMBULATORY_CARE_PROVIDER_SITE_OTHER): Payer: 59 | Admitting: Family Medicine

## 2022-06-23 ENCOUNTER — Encounter: Payer: Self-pay | Admitting: Family Medicine

## 2022-06-23 VITALS — BP 98/68 | HR 55 | Temp 97.9°F | Ht 65.75 in | Wt 276.0 lb

## 2022-06-23 DIAGNOSIS — R7303 Prediabetes: Secondary | ICD-10-CM

## 2022-06-23 MED ORDER — METFORMIN HCL 500 MG PO TABS: 500.0000 mg | ORAL_TABLET | Freq: Two times a day (BID) | ORAL | 1 refills | Status: DC

## 2022-06-23 NOTE — Patient Instructions (Addendum)
Total calorie: 1800-1900 per day  Total protein: at least 85 grams, try to get 100 grams if you can  Total carbohydrate intake: 60-90 gram per day  Fiber -- at least 35 gram of fiber day  Adkins shakes, or Keto line Slim fast  Apps -- My Fitness Pal, Loseit!, [employer]  Call you insurance-- ask if they cover: Saxenda, Wegovy, Zepbound for weight loss.

## 2022-06-23 NOTE — Progress Notes (Signed)
New Patient Office Visit  Subjective    Patient ID: Kimberly Lyons, female    DOB: 29-Dec-1960  Age: 62 y.o. MRN: [id number]  CC:  Chief Complaint  Patient presents with  . Establish Care    HPI Kimberly Lyons presents to establish care Pt had a previous PCP but she moved from the area. Needed to establish wit ha new one. Patient reports no new or acute problems.   Pt reports that she has a history of CHF, sees cardiology regularly. States that she would like to lose weight because she is having BL knee pain, has appointment with them on Wednesday this week.   Patient reports that no blockages in her arteries. I have reviewed her lest ECHO in 2022, I reviewed her medications, on entresto which is working well for her.   Pt reports no OSA symptoms, no daytime somnolence states she is "sleeping like a baby" at night and feels well rested when she wakes up.  Outpatient Encounter Medications as of 06/23/2022  Medication Sig  . carvedilol (COREG) 25 MG tablet TAKE 1 TABLET BY MOUTH TWICE A DAY  . furosemide (LASIX) 20 MG tablet Take 20 mg by mouth as needed.  . sacubitril-valsartan (ENTRESTO) 97-103 MG TAKE 1 TABLET BY MOUTH TWICE A DAY  . spironolactone (ALDACTONE) 25 MG tablet TAKE 1 TABLET (25 MG TOTAL) BY MOUTH DAILY.   No facility-administered encounter medications on file as of 06/23/2022.    Past Medical History:  Diagnosis Date  . Acute systolic HF (heart failure) ([place])   . Heart disease   . Hyperglycemia   . Morbid obesity ([place])     History reviewed. No pertinent surgical history.  Family History  Problem Relation Age of Onset  . Hyperlipidemia Mother   . Hypertension Mother   . Heart attack Mother 95  . Cancer Father        Prostate  . Hypertension Father   . Cancer Sister   . Diabetes Sister   . Hyperlipidemia Sister     Social History   Socioeconomic History  . Marital status: Widowed    Spouse name: n/a  . Number of children: 1  . Years of education:  19  . Highest education level: Not on file  Occupational History  . Occupation: Administrator, sports    Comment: furniture company  Tobacco Use  . Smoking status: Never  . Smokeless tobacco: Never  Vaping Use  . Vaping Use: Never used  Substance and Sexual Activity  . Alcohol use: Never  . Drug use: Never  . Sexual activity: Not Currently  Other Topics Concern  . 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  Her daughter lives with her.   Social Determinants of Health   Financial Resource Strain: Not on file  Food Insecurity: Not on file  Transportation Needs: Not on file  Physical Activity: Not on file  Stress: Not on file  Social Connections: Not on file  Intimate Partner Violence: Not on file    Review of Systems  All other systems reviewed and are negative.       Objective    BP 98/68 (BP Location: Left Arm, Patient Position: Sitting, Cuff Size: Large)   Pulse (!) 55   Temp 97.9 F (36.6 C) (Oral)   Ht 5' 5.75" (1.67 m)   Wt 276 lb (125.2 kg)   SpO2 97%   BMI 44.89 kg/m   Physical Exam  Vitals reviewed.  Constitutional:      Appearance: Normal appearance. She is well-groomed. She is morbidly obese.  Eyes:     Conjunctiva/sclera: Conjunctivae normal.  Neck:     Thyroid: No thyromegaly.  Cardiovascular:     Rate and Rhythm: Normal rate and regular rhythm.     Pulses: Normal pulses.     Heart sounds: S1 normal and S2 normal.  Pulmonary:     Effort: Pulmonary effort is normal.     Breath sounds: Normal breath sounds and air entry.  Abdominal:     General: Bowel sounds are normal.  Musculoskeletal:     Right lower leg: No edema.     Left lower leg: No edema.  Neurological:     Mental Status: She is alert and oriented to person, place, and time. Mental status is at baseline.     Gait: Gait is intact.  Psychiatric:        Mood and Affect: Mood and affect normal.        Speech: Speech normal.        Behavior: Behavior  normal.        Judgment: Judgment normal.    {Labs (Optional):23779}    Assessment & Plan:   Problem List Items Addressed This Visit       Unprioritized   Prediabetes - Primary   Relevant Medications   metFORMIN (GLUCOPHAGE) 500 MG tablet    Return in about 3 months (around 09/21/2022) for weight loss.   Farrel Conners, MD

## 2022-06-26 NOTE — Assessment & Plan Note (Addendum)
I have had an extensive 30 minute conversation today with the patient about healthy eating habits, exercise, calorie and carb goals for sustainable and successful weight loss. I gave the patient caloric and protein daily intake values as well as described the importance of increasing fiber and water intake. I discussed weight loss medications that could be used in the treatment of this patient. Handouts on low carb eating were given to the patient.    Patient's goal for calorie and carb intake are listed below. She will now be considered to be enrolled in a calorie restricted/ low carb weight loss program and she will be exercising for 30 minutes daily.   Total calorie: 1800-1900 per day  Total protein: at least 85 grams, try to get 100 grams if you can  Total carbohydrate intake: 60-90 gram per day  Fiber -- at least 35 gram of fiber day

## 2022-06-26 NOTE — Assessment & Plan Note (Addendum)
A1C was reviewed and is 6.1. I discussed the low carb diet extensively with the patient and I gave her specific goals and handouts, see above. We discussed starting metformin and she is agreeable.

## 2022-08-09 ENCOUNTER — Encounter: Payer: Self-pay | Admitting: Cardiology

## 2022-08-10 ENCOUNTER — Emergency Department (HOSPITAL_COMMUNITY): Payer: 59

## 2022-08-10 ENCOUNTER — Other Ambulatory Visit: Payer: Self-pay

## 2022-08-10 ENCOUNTER — Inpatient Hospital Stay (HOSPITAL_COMMUNITY)
Admission: EM | Admit: 2022-08-10 | Discharge: 2022-08-14 | DRG: 522 | Disposition: A | Discharge status: Home / Self Care | Payer: 59 | Attending: Internal Medicine | Admitting: Internal Medicine

## 2022-08-10 DIAGNOSIS — I2510 Atherosclerotic heart disease of native coronary artery without angina pectoris: Secondary | ICD-10-CM | POA: Diagnosis present

## 2022-08-10 DIAGNOSIS — Z833 Family history of diabetes mellitus: Secondary | ICD-10-CM

## 2022-08-10 DIAGNOSIS — S72012A Unspecified intracapsular fracture of left femur, initial encounter for closed fracture: Principal | ICD-10-CM | POA: Diagnosis present

## 2022-08-10 DIAGNOSIS — Z83438 Family history of other disorder of lipoprotein metabolism and other lipidemia: Secondary | ICD-10-CM | POA: Diagnosis not present

## 2022-08-10 DIAGNOSIS — Z8249 Family history of ischemic heart disease and other diseases of the circulatory system: Secondary | ICD-10-CM

## 2022-08-10 DIAGNOSIS — W19XXXA Unspecified fall, initial encounter: Principal | ICD-10-CM

## 2022-08-10 DIAGNOSIS — Z6841 Body Mass Index (BMI) 40.0 and over, adult: Secondary | ICD-10-CM

## 2022-08-10 DIAGNOSIS — W109XXA Fall (on) (from) unspecified stairs and steps, initial encounter: Secondary | ICD-10-CM | POA: Diagnosis present

## 2022-08-10 DIAGNOSIS — I10 Essential (primary) hypertension: Secondary | ICD-10-CM | POA: Diagnosis not present

## 2022-08-10 DIAGNOSIS — R7303 Prediabetes: Secondary | ICD-10-CM | POA: Diagnosis present

## 2022-08-10 DIAGNOSIS — W010XXA Fall on same level from slipping, tripping and stumbling without subsequent striking against object, initial encounter: Secondary | ICD-10-CM | POA: Diagnosis present

## 2022-08-10 DIAGNOSIS — E119 Type 2 diabetes mellitus without complications: Secondary | ICD-10-CM | POA: Diagnosis present

## 2022-08-10 DIAGNOSIS — R296 Repeated falls: Secondary | ICD-10-CM | POA: Diagnosis present

## 2022-08-10 DIAGNOSIS — I5022 Chronic systolic (congestive) heart failure: Secondary | ICD-10-CM | POA: Diagnosis present

## 2022-08-10 DIAGNOSIS — Z79899 Other long term (current) drug therapy: Secondary | ICD-10-CM

## 2022-08-10 DIAGNOSIS — Z7984 Long term (current) use of oral hypoglycemic drugs: Secondary | ICD-10-CM

## 2022-08-10 DIAGNOSIS — I959 Hypotension, unspecified: Secondary | ICD-10-CM | POA: Diagnosis present

## 2022-08-10 DIAGNOSIS — N179 Acute kidney failure, unspecified: Secondary | ICD-10-CM | POA: Diagnosis present

## 2022-08-10 DIAGNOSIS — S72002A Fracture of unspecified part of neck of left femur, initial encounter for closed fracture: Secondary | ICD-10-CM

## 2022-08-10 DIAGNOSIS — Y92018 Other place in single-family (private) house as the place of occurrence of the external cause: Secondary | ICD-10-CM | POA: Diagnosis not present

## 2022-08-10 LAB — BASIC METABOLIC PANEL
Anion gap: 8 (ref 5–15)
BUN: 31 mg/dL — ABNORMAL HIGH (ref 8–23)
CO2: 21 mmol/L — ABNORMAL LOW (ref 22–32)
Calcium: 9.2 mg/dL (ref 8.9–10.3)
Chloride: 109 mmol/L (ref 98–111)
Creatinine, Ser: 1.24 mg/dL — ABNORMAL HIGH (ref 0.44–1.00)
GFR, Estimated: 49 mL/min — ABNORMAL LOW (ref >60)
Glucose, Bld: 144 mg/dL — ABNORMAL HIGH (ref 70–99)
Potassium: 4.5 mmol/L (ref 3.5–5.1)
Sodium: 138 mmol/L (ref 135–145)

## 2022-08-10 LAB — URINALYSIS, ROUTINE W REFLEX MICROSCOPIC
Bilirubin Urine: NEGATIVE
Glucose, UA: NEGATIVE mg/dL
Hgb urine dipstick: NEGATIVE
Ketones, ur: 20 mg/dL — AB
Nitrite: POSITIVE — AB
Protein, ur: NEGATIVE mg/dL
Specific Gravity, Urine: 1.021 (ref 1.005–1.030)
pH: 5 (ref 5.0–8.0)

## 2022-08-10 LAB — ABO/RH: ABO/RH(D): O NEG

## 2022-08-10 LAB — TYPE AND SCREEN
ABO/RH(D): O NEG
Antibody Screen: NEGATIVE

## 2022-08-10 LAB — CBC WITH DIFFERENTIAL/PLATELET
Abs Immature Granulocytes: 0.06 10*3/uL (ref 0.00–0.07)
Basophils Absolute: 0 10*3/uL (ref 0.0–0.1)
Basophils Relative: 0 %
Eosinophils Absolute: 0.1 10*3/uL (ref 0.0–0.5)
Eosinophils Relative: 1 %
HCT: 39 % (ref 36.0–46.0)
Hemoglobin: 12.8 g/dL (ref 12.0–15.0)
Immature Granulocytes: 1 %
Lymphocytes Relative: 11 %
Lymphs Abs: 1.3 10*3/uL (ref 0.7–4.0)
MCH: 30.4 pg (ref 26.0–34.0)
MCHC: 32.8 g/dL (ref 30.0–36.0)
MCV: 92.6 fL (ref 80.0–100.0)
Monocytes Absolute: 0.9 10*3/uL (ref 0.1–1.0)
Monocytes Relative: 7 %
Neutro Abs: 9.7 10*3/uL — ABNORMAL HIGH (ref 1.7–7.7)
Neutrophils Relative %: 80 %
Platelets: 268 10*3/uL (ref 150–400)
RBC: 4.21 MIL/uL (ref 3.87–5.11)
RDW: 13.5 % (ref 11.5–15.5)
WBC: 12.1 10*3/uL — ABNORMAL HIGH (ref 4.0–10.5)
nRBC: 0 % (ref 0.0–0.2)

## 2022-08-10 LAB — CBC
HCT: 37.1 % (ref 36.0–46.0)
Hemoglobin: 12 g/dL (ref 12.0–15.0)
MCH: 30.5 pg (ref 26.0–34.0)
MCHC: 32.3 g/dL (ref 30.0–36.0)
MCV: 94.4 fL (ref 80.0–100.0)
Platelets: 246 10*3/uL (ref 150–400)
RBC: 3.93 MIL/uL (ref 3.87–5.11)
RDW: 13.4 % (ref 11.5–15.5)
WBC: 12 10*3/uL — ABNORMAL HIGH (ref 4.0–10.5)
nRBC: 0 % (ref 0.0–0.2)

## 2022-08-10 LAB — CREATININE, SERUM
Creatinine, Ser: 1.05 mg/dL — ABNORMAL HIGH (ref 0.44–1.00)
GFR, Estimated: 59 mL/min — ABNORMAL LOW (ref >60)

## 2022-08-10 LAB — PROTIME-INR
INR: 1.2 (ref 0.8–1.2)
Prothrombin Time: 15.4 seconds — ABNORMAL HIGH (ref 11.4–15.2)

## 2022-08-10 LAB — HIV ANTIBODY (ROUTINE TESTING W REFLEX): HIV Screen 4th Generation wRfx: NONREACTIVE

## 2022-08-10 MED ORDER — SACUBITRIL-VALSARTAN 97-103 MG PO TABS
1.0000 | ORAL_TABLET | Freq: Two times a day (BID) | ORAL | Status: DC
  Administered 2022-08-10: 1 via ORAL
  Filled 2022-08-10 (×2): qty 1

## 2022-08-10 MED ORDER — POLYETHYLENE GLYCOL 3350 17 G PO PACK: 17.0000 g | PACK | Freq: Every day | ORAL | Status: DC | PRN

## 2022-08-10 MED ORDER — DOCUSATE SODIUM 100 MG PO CAPS
100.0000 mg | ORAL_CAPSULE | Freq: Two times a day (BID) | ORAL | Status: DC
  Administered 2022-08-10 – 2022-08-11 (×2): 100 mg via ORAL
  Filled 2022-08-10 (×3): qty 1

## 2022-08-10 MED ORDER — MORPHINE SULFATE (PF) 4 MG/ML IV SOLN
4.0000 mg | INTRAVENOUS | Status: DC | PRN
  Administered 2022-08-10: 4 mg via INTRAVENOUS
  Filled 2022-08-10: qty 1

## 2022-08-10 MED ORDER — SODIUM CHLORIDE 0.9 % IV SOLN: INTRAVENOUS | Status: DC

## 2022-08-10 MED ORDER — ONDANSETRON HCL 4 MG PO TABS: 4.0000 mg | ORAL_TABLET | Freq: Four times a day (QID) | ORAL | Status: DC | PRN

## 2022-08-10 MED ORDER — HYDROMORPHONE HCL 1 MG/ML IJ SOLN
1.0000 mg | Freq: Once | INTRAMUSCULAR | Status: AC
  Administered 2022-08-10: 1 mg via INTRAVENOUS
  Filled 2022-08-10: qty 1

## 2022-08-10 MED ORDER — ALBUTEROL SULFATE (2.5 MG/3ML) 0.083% IN NEBU: 2.5000 mg | INHALATION_SOLUTION | RESPIRATORY_TRACT | Status: DC | PRN

## 2022-08-10 MED ORDER — ONDANSETRON HCL 4 MG/2ML IJ SOLN: 4.0000 mg | Freq: Four times a day (QID) | INTRAMUSCULAR | Status: DC | PRN

## 2022-08-10 MED ORDER — ONDANSETRON HCL 4 MG/2ML IJ SOLN
4.0000 mg | Freq: Once | INTRAMUSCULAR | Status: AC
  Administered 2022-08-10: 4 mg via INTRAVENOUS
  Filled 2022-08-10: qty 2

## 2022-08-10 MED ORDER — ENOXAPARIN SODIUM 60 MG/0.6ML IJ SOSY
60.0000 mg | PREFILLED_SYRINGE | INTRAMUSCULAR | Status: DC
  Administered 2022-08-10: 60 mg via SUBCUTANEOUS
  Filled 2022-08-10: qty 0.6

## 2022-08-10 MED ORDER — OXYCODONE HCL 5 MG PO TABS
5.0000 mg | ORAL_TABLET | ORAL | Status: DC | PRN
  Administered 2022-08-10 – 2022-08-12 (×8): 5 mg via ORAL
  Filled 2022-08-10 (×8): qty 1

## 2022-08-10 MED ORDER — CARVEDILOL 12.5 MG PO TABS
25.0000 mg | ORAL_TABLET | Freq: Two times a day (BID) | ORAL | Status: DC
  Administered 2022-08-10: 25 mg via ORAL
  Filled 2022-08-10: qty 2

## 2022-08-10 MED ORDER — HYDROMORPHONE HCL 1 MG/ML IJ SOLN: 1.0000 mg | INTRAMUSCULAR | Status: DC | PRN

## 2022-08-10 NOTE — H&P (Signed)
History and Physical  NIMSY DROSS [id number] DOB: Feb 25, 1961 DOA: 08/10/2022  PCP: Karie Georges, MD   Chief Complaint: left hip pain   HPI: Kimberly Lyons is a 62 y.o. female with medical history significant for systolic heart failure Entresto, non-insulin-dependent diabetes recently started on metformin, morbid obesity admitted to the hospital with left hip fracture after mechanical fall.  She usually walks without assistance, slipped and fell outside of her home the other day when it was raining, was having left thigh pain afterwards, but was able to ambulate thereafter and was getting slightly better each day.  He has.  The pain was becoming excruciating and she was unable to ambulate, so she went to [facility].  X-ray revealed nondisplaced left femoral neck fracture and she was scheduled for left total hip arthroplasty on Monday 4/15.  Unfortunately she fell down a couple steps on her way to the car today, when she was on the way to the ER due to uncontrolled pain.  She denies any recent medication changes, chest pain, nausea, vomiting, dyspnea with exertion.  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  States that she continues to take Coreg and Entresto, her normal blood pressure is in the high 90s.  ED Course: She was evaluated in the emergency department, after being brought by EMS and given 150 mcg of fentanyl en route.  Vital signs show stable hypotension, lab work is stable and unremarkable other than a slight rise in her creatinine from baseline.  ER provider discussed with orthopedic surgery, who recommends hospitalist admission and they will consult.  Review of Systems: Please see HPI for pertinent positives and negatives. A complete 10 system review of systems are otherwise negative.  Past Medical History:  Diagnosis Date   Acute systolic HF (heart failure) ([facility])    Heart  disease    Hyperglycemia    Morbid obesity ([facility])    No past surgical history on file.  Social History:  reports that she has never smoked. She has never used smokeless tobacco. She reports that she does not drink alcohol and does not use drugs.   No Known Allergies  Family History  Problem Relation Age of Onset   Hyperlipidemia Mother    Hypertension Mother    Heart attack Mother 65   Cancer Father        Prostate   Hypertension Father    Cancer Sister    Diabetes Sister    Hyperlipidemia Sister      Prior to Admission medications   Medication Sig Start Date End Date Taking? Authorizing Provider  carvedilol (COREG) 25 MG tablet TAKE 1 TABLET BY MOUTH TWICE A DAY 08/15/21   Rollene Rotunda, MD  furosemide (LASIX) 20 MG tablet Take 20 mg by mouth daily as needed (fluid retention (high sodium food intake)).    [provider]  metFORMIN (GLUCOPHAGE) 500 MG tablet Take 1 tablet (500 mg total) by mouth 2 (two) times daily with a meal. 06/23/22   Karie Georges, MD  naproxen sodium (ALEVE) 220 MG tablet Take 220 mg by mouth 2 (two) times daily as needed (pain.).    [provider]  sacubitril-valsartan (ENTRESTO) 97-103 MG TAKE 1 TABLET BY MOUTH TWICE A DAY 06/19/22   Rollene Rotunda, MD  spironolactone (ALDACTONE) 25 MG tablet TAKE 1 TABLET (25 MG TOTAL) BY MOUTH DAILY. 12/23/21   Rollene Rotunda, MD  Physical Exam: BP 116/72 (BP Location: Right Arm)   Pulse 69   Temp 97.6 F (36.4 C) (Oral)   Resp 18   Ht 5' 5.75" (1.67 m)   Wt 117.9 kg   SpO2 98%   BMI 42.28 kg/m   General:  Alert, oriented, calm, morbidly obese female in no acute distress, resting comfortably on her left side.  Her niece who is an OR Charity fundraiser in [place] is present at the bedside as well. Eyes: EOMI, clear conjuctivae, white sclerea Neck: supple, trachea midline Cardiovascular: Sinus rhythm on monitor, extremities well-perfused Respiratory: Breathing comfortably on room air, no  respiratory distress speaking in full sentences without difficulty Abdomen: soft, nontender, nondistended, normal bowel tones heard  Skin: dry, no rashes  Psychiatric: appropriate affect, normal speech  Neurologic: extraocular muscles intact, clear speech, moving all extremities with intact sensorium          Labs on Admission:  Basic Metabolic Panel: Recent Labs  Lab 08/10/22 1046  NA 138  K 4.5  CL 109  CO2 21*  GLUCOSE 144*  BUN 31*  CREATININE 1.24*  CALCIUM 9.2   Liver Function Tests: No results for input(s): "AST", "ALT", "ALKPHOS", "BILITOT", "PROT", "ALBUMIN" in the last 168 hours. No results for input(s): "LIPASE", "AMYLASE" in the last 168 hours. No results for input(s): "AMMONIA" in the last 168 hours. CBC: Recent Labs  Lab 08/10/22 1046  WBC 12.1*  NEUTROABS 9.7*  HGB 12.8  HCT 39.0  MCV 92.6  PLT 268   Cardiac Enzymes: No results for input(s): "CKTOTAL", "CKMB", "CKMBINDEX", "TROPONINI" in the last 168 hours.  BNP (last 3 results) No results for input(s): "BNP" in the last 8760 hours.  ProBNP (last 3 results) No results for input(s): "PROBNP" in the last 8760 hours.  CBG: No results for input(s): "GLUCAP" in the last 168 hours.  Radiological Exams on Admission: DG Knee Complete 4 Views Left  Result Date: 08/10/2022 CLINICAL DATA:  Left hip fracture, fall, pain EXAM: LEFT KNEE - COMPLETE 4+ VIEW COMPARISON:  08/10/2022 FINDINGS: Also limited because of positioning. Bones are osteopenic. Tricompartmental left knee osteoarthritis noted with joint space loss, sclerosis, and osteophyte formation. No acute displaced fracture, malalignment, or large effusion. No focal soft tissue abnormality. IMPRESSION: 1. Osteopenia and tricompartmental left knee osteoarthritis. 2. No acute osseous finding by plain radiography. Electronically Signed   By: Judie Petit.  Shick M.D.   On: 08/10/2022 11:51   DG Hip Unilat With Pelvis 2-3 Views Left  Result Date: 08/10/2022 CLINICAL  DATA:  Fall, left hip fracture EXAM: DG HIP (WITH OR WITHOUT PELVIS) 2-3V LEFT COMPARISON:  None Available. FINDINGS: Limited exam because of oblique positioning and body habitus. Deformity of the left hip noted compatible with a proximal left hip subcapital neck fracture. Right hip intact. Included pelvis grossly normal. Obese body habitus. IMPRESSION: Left hip subcapital neck fracture. Electronically Signed   By: Judie Petit.  Shick M.D.   On: 08/10/2022 11:50   DG Chest 2 View  Result Date: 08/10/2022 CLINICAL DATA:  Fall, left hip fracture EXAM: CHEST - 2 VIEW COMPARISON:  07/29/2018 FINDINGS: Limited exam with rotation to the left. Heart is enlarged. No acute CHF or definite pneumonia. No large effusion or pneumothorax. Lateral view is also limited with rotation and obliquity. IMPRESSION: Cardiomegaly without acute process. Limited exam because of positioning. Electronically Signed   By: Judie Petit.  Shick M.D.   On: 08/10/2022 11:48    Assessment/Plan Principal Problem:   Hip fracture, left, closed, initial encounter-after  mechanical fall, patient denies any syncope, chest pain, or other prodrome.  Did not hit her head. -Inpatient admission -Pain and nausea control as needed -Orthopedic surgery has been consulted by EDP, they will consult -Patient not on our schedule yet, can eat per orthopedic service -Postoperative pain control, therapy, weightbearing status and rehab recommendations per orthopedic service Active Problems: Morbid obesity with BMI of 42.3-complicating all aspects of care Prediabetes-hold home metformin while inpatient, diabetic diet when eating with moderate dose sliding scale insulin   AKI (acute kidney injury)-mild rise in her creatinine, will hold Aldactone but continue her Entresto for now.  If creatinine not improving, would hold Entresto and hydrate gently.   Chronic systolic HF (heart failure)-currently no evidence of acute exacerbation, will continue Coreg and Entresto, hold Aldactone  for now due to moderate hypotension, though it is asymptomatic.  However hypotension could easily be exacerbated by IV narcotics.   DVT prophylaxis: Lovenox     Code Status: Full Code  Consults called: EDP consulted orthopedic surgery.  Admission status: The appropriate patient status for this patient is INPATIENT. Inpatient status is judged to be reasonable and necessary in order to provide the required intensity of service to ensure the patient's safety. The patient's presenting symptoms, physical exam findings, and initial radiographic and laboratory data in the context of their chronic comorbidities is felt to place them at high risk for further clinical deterioration. Furthermore, it is not anticipated that the patient will be medically stable for discharge from the hospital within 2 midnights of admission.    I certify that at the point of admission it is my clinical judgment that the patient will require inpatient hospital care spanning beyond 2 midnights from the point of admission due to high intensity of service, high risk for further deterioration and high frequency of surveillance required   Time spent: 49 minutes  Anshi Jalloh Sharlette DenseM Larra Crunkleton MD [facility] Pager [phone]  If 7PM-7AM, please contact night-coverage [email or web address] Password [email or web address]  08/10/2022, 2:13 PM

## 2022-08-10 NOTE — Progress Notes (Signed)
Surgery orders requested via Epic inbox. °

## 2022-08-10 NOTE — ED Provider Notes (Signed)
[place] EMERGENCY DEPARTMENT AT [facility] Provider Note   CSN: [id number] Arrival date & time: 08/10/22  1025     History  Chief Complaint  Patient presents with   Fall   Hip Pain    Kimberly Lyons is a 62 y.o. female.  Pt is a 62 yo female with pmhx significant for CHF, morbid obesity, hyperglycemia, and CAD.  She has been having left hip pain and saw [facility] yesterday.  She was diagnosed with an acute left nondisplaced femoral neck fx.  She was able to ambulate with a rolling walker.  She was scheduled for a total hip arthroplasty on Monday, 4/15.  She said the pain was much worse today, so she decided to come to the ED.  Unfortunately, pt fell going down 2 steps on her way to the car.  She landed on the left side and felt something crack.  She was unable to get up.  EMS called and she was given 150 mcg fentanyl en route.  Pain is still severe.  Pt denies any other injury.         Home Medications Prior to Admission medications   Medication Sig Start Date End Date Taking? Authorizing Provider  carvedilol (COREG) 25 MG tablet TAKE 1 TABLET BY MOUTH TWICE A DAY 08/15/21   Rollene Rotunda, MD  furosemide (LASIX) 20 MG tablet Take 20 mg by mouth daily as needed (fluid retention (high sodium food intake)).    [provider]  metFORMIN (GLUCOPHAGE) 500 MG tablet Take 1 tablet (500 mg total) by mouth 2 (two) times daily with a meal. 06/23/22   Karie Georges, MD  naproxen sodium (ALEVE) 220 MG tablet Take 220 mg by mouth 2 (two) times daily as needed (pain.).    [provider]  sacubitril-valsartan (ENTRESTO) 97-103 MG TAKE 1 TABLET BY MOUTH TWICE A DAY 06/19/22   Rollene Rotunda, MD  spironolactone (ALDACTONE) 25 MG tablet TAKE 1 TABLET (25 MG TOTAL) BY MOUTH DAILY. 12/23/21   Rollene Rotunda, MD      Allergies    Patient has no known allergies.    Review of Systems   Review of Systems  Musculoskeletal:        Left hip pain  All other  systems reviewed and are negative.   Physical Exam Updated Vital Signs BP 116/72 (BP Location: Right Arm)   Pulse 69   Temp 97.6 F (36.4 C) (Oral)   Resp 18   Ht 5' 5.75" (1.67 m)   Wt 117.9 kg   SpO2 98%   BMI 42.28 kg/m  Physical Exam Vitals and nursing note reviewed.  Constitutional:      Appearance: Normal appearance. She is obese.  HENT:     Head: Normocephalic and atraumatic.     Right Ear: External ear normal.     Left Ear: External ear normal.     Nose: Nose normal.     Mouth/Throat:     Mouth: Mucous membranes are moist.     Pharynx: Oropharynx is clear.  Eyes:     Extraocular Movements: Extraocular movements intact.     Conjunctiva/sclera: Conjunctivae normal.     Pupils: Pupils are equal, round, and reactive to light.  Cardiovascular:     Rate and Rhythm: Normal rate and regular rhythm.     Pulses: Normal pulses.     Heart sounds: Normal heart sounds.  Pulmonary:     Effort: Pulmonary effort is normal.  Breath sounds: Normal breath sounds.  Abdominal:     General: Abdomen is flat. Bowel sounds are normal.     Palpations: Abdomen is soft.  Musculoskeletal:     Cervical back: Normal range of motion and neck supple.       Legs:  Skin:    General: Skin is warm.     Capillary Refill: Capillary refill takes less than 2 seconds.  Neurological:     General: No focal deficit present.     Mental Status: She is alert and oriented to person, place, and time.  Psychiatric:        Mood and Affect: Mood normal.        Behavior: Behavior normal.     ED Results / Procedures / Treatments   Labs (all labs ordered are listed, but only abnormal results are displayed) Labs Reviewed  BASIC METABOLIC PANEL - Abnormal; Notable for the following components:      Result Value   CO2 21 (*)    Glucose, Bld 144 (*)    BUN 31 (*)    Creatinine, Ser 1.24 (*)    GFR, Estimated 49 (*)    All other components within normal limits  CBC WITH DIFFERENTIAL/PLATELET -  Abnormal; Notable for the following components:   WBC 12.1 (*)    Neutro Abs 9.7 (*)    All other components within normal limits  PROTIME-INR - Abnormal; Notable for the following components:   Prothrombin Time 15.4 (*)    All other components within normal limits  URINALYSIS, ROUTINE W REFLEX MICROSCOPIC  TYPE AND SCREEN  ABO/RH    EKG EKG Interpretation  Date/Time:  Thursday August 10 2022 10:38:50 EDT Ventricular Rate:  88 PR Interval:    QRS Duration: 117 QT Interval:  449 QTC Calculation: 371 R Axis:   15 Text Interpretation: Normal sinus rhythm Paired ventricular premature complexes Aberrant conduction of SV complex(es) Nonspecific intraventricular conduction delay Low voltage, precordial leads No significant change since last tracing Confirmed by Jacalyn LefevreHaviland, Edwyn Inclan [phone]([id number]) on 08/10/2022 10:59:27 AM  Radiology DG Knee Complete 4 Views Left  Result Date: 08/10/2022 CLINICAL DATA:  Left hip fracture, fall, pain EXAM: LEFT KNEE - COMPLETE 4+ VIEW COMPARISON:  08/10/2022 FINDINGS: Also limited because of positioning. Bones are osteopenic. Tricompartmental left knee osteoarthritis noted with joint space loss, sclerosis, and osteophyte formation. No acute displaced fracture, malalignment, or large effusion. No focal soft tissue abnormality. IMPRESSION: 1. Osteopenia and tricompartmental left knee osteoarthritis. 2. No acute osseous finding by plain radiography. Electronically Signed   By: Judie PetitM.  Shick M.D.   On: 08/10/2022 11:51   DG Hip Unilat With Pelvis 2-3 Views Left  Result Date: 08/10/2022 CLINICAL DATA:  Fall, left hip fracture EXAM: DG HIP (WITH OR WITHOUT PELVIS) 2-3V LEFT COMPARISON:  None Available. FINDINGS: Limited exam because of oblique positioning and body habitus. Deformity of the left hip noted compatible with a proximal left hip subcapital neck fracture. Right hip intact. Included pelvis grossly normal. Obese body habitus. IMPRESSION: Left hip subcapital neck fracture.  Electronically Signed   By: Judie PetitM.  Shick M.D.   On: 08/10/2022 11:50   DG Chest 2 View  Result Date: 08/10/2022 CLINICAL DATA:  Fall, left hip fracture EXAM: CHEST - 2 VIEW COMPARISON:  07/29/2018 FINDINGS: Limited exam with rotation to the left. Heart is enlarged. No acute CHF or definite pneumonia. No large effusion or pneumothorax. Lateral view is also limited with rotation and obliquity. IMPRESSION: Cardiomegaly without acute process. Limited exam  because of positioning. Electronically Signed   By: Judie Petit.  Shick M.D.   On: 08/10/2022 11:48    Procedures Procedures    Medications Ordered in ED Medications  0.9 %  sodium chloride infusion ( Intravenous New Bag/Given 08/10/22 1059)  morphine (PF) 4 MG/ML injection 4 mg (4 mg Intravenous Given 08/10/22 1056)  ondansetron (ZOFRAN) injection 4 mg (4 mg Intravenous Given 08/10/22 1056)  HYDROmorphone (DILAUDID) injection 1 mg (1 mg Intravenous Given 08/10/22 1239)    ED Course/ Medical Decision Making/ A&P                             Medical Decision Making Amount and/or Complexity of Data Reviewed Labs: ordered. Radiology: ordered.  Risk Prescription drug management. Decision regarding hospitalization.   This patient presents to the ED for concern of hip pain, this involves an extensive number of treatment options, and is a complaint that carries with it a high risk of complications and morbidity.  The differential diagnosis includes fx, contusion   Co morbidities that complicate the patient evaluation  CHF, morbid obesity, hyperglycemia, and CAD   Additional history obtained:  Additional history obtained from epic chart review External records from outside source obtained and reviewed including EMS report   Lab Tests:  I Ordered, and personally interpreted labs.  The pertinent results include:  cbc nl, bmp nl other than mild cr 1.24 (gfr 49), inr 1.2   Imaging Studies ordered:  I ordered imaging studies including cxr, knee,  hip  I independently visualized and interpreted imaging which showed  CXR: Cardiomegaly without acute process. Limited exam because of  positioning.  Hip: Left hip subcapital neck fracture.  Knee: Marland Kitchen Osteopenia and tricompartmental left knee osteoarthritis.  2. No acute osseous finding by plain radiography.   I agree with the radiologist interpretation   Cardiac Monitoring:  The patient was maintained on a cardiac monitor.  I personally viewed and interpreted the cardiac monitored which showed an underlying rhythm of: nsr   Medicines ordered and prescription drug management:  I ordered medication including morphine/dilaudid  for pain  Reevaluation of the patient after these medicines showed that the patient improved I have reviewed the patients home medicines and have made adjustments as needed   Test Considered:  xr   Critical Interventions:  Pain control   Consultations Obtained:  I requested consultation with the orthopedist (Dr. Odis Hollingshead),  and discussed lab and imaging findings as well as pertinent plan - they will consult.  He said Dr. Charlann Boxer may not be able to operate until Monday.  So, she can eat/drink.  Pt d/w Dr. Erenest Blank (triad) for admission.   Problem List / ED Course:  Hip fx:  pt is not ambulatory and pain is not under control.  Ortho will consult.   Reevaluation:  After the interventions noted above, I reevaluated the patient and found that they have :improved   Social Determinants of Health:  Lives at home   Dispostion:  After consideration of the diagnostic results and the patients response to treatment, I feel that the patent would benefit from admission.          Final Clinical Impression(s) / ED Diagnoses Final diagnoses:  Fall, initial encounter  Closed fracture of neck of left femur, initial encounter    Rx / DC Orders ED Discharge Orders     None         Jacalyn Lefevre, MD 08/10/22 1307

## 2022-08-10 NOTE — ED Triage Notes (Signed)
Pt BIBA from home for slip and fall down 2 steps. Known left hip fx with surgery scheduled Monday. Heard something crack when she fell, cannot ambulate now. 20ga LAC, 100,cg fentanyl, 150cc NS  90/50 normal range HR 65 97% RA

## 2022-08-10 NOTE — H&P (View-Only) (Signed)
Reason for Consult:left hip fracture Referring Physician: Kirby Crigler, MD  Kimberly Lyons is an 62 y.o. female.  HPI: 62 yo female seen in our office yesterday after a fall 2 weeks ago.  We decided to try and continue her current state until somewhat electively addressing her hip fracture on Monday.  Apparently her pain increased necessitating her transfer to [facility] today by EMS.  Past Medical History:  Diagnosis Date   Acute systolic HF (heart failure) (HCC)    Heart disease    Hyperglycemia    Morbid obesity (HCC)     No past surgical history on file.  Family History  Problem Relation Age of Onset   Hyperlipidemia Mother    Hypertension Mother    Heart attack Mother 42   Cancer Father        Prostate   Hypertension Father    Cancer Sister    Diabetes Sister    Hyperlipidemia Sister     Social History:  reports that she has never smoked. She has never used smokeless tobacco. She reports that she does not drink alcohol and does not use drugs.  Allergies: No Known Allergies  Medications: I have reviewed the patient's current medications. Scheduled:  carvedilol  25 mg Oral BID   docusate sodium  100 mg Oral BID   enoxaparin (LOVENOX) injection  60 mg Subcutaneous Q24H   sacubitril-valsartan  1 tablet Oral BID    Results for orders placed or performed during the hospital encounter of 08/10/22 (from the past 24 hour(s))  Urinalysis, Routine w reflex microscopic -Urine, Clean Catch     Status: Abnormal   Collection Time: 08/10/22 10:34 AM  Result Value Ref Range   Color, Urine YELLOW YELLOW   APPearance HAZY (A) CLEAR   Specific Gravity, Urine 1.021 1.005 - 1.030   pH 5.0 5.0 - 8.0   Glucose, UA NEGATIVE NEGATIVE mg/dL   Hgb urine dipstick NEGATIVE NEGATIVE   Bilirubin Urine NEGATIVE NEGATIVE   Ketones, ur 20 (A) NEGATIVE mg/dL   Protein, ur NEGATIVE NEGATIVE mg/dL   Nitrite POSITIVE (A) NEGATIVE   Leukocytes,Ua TRACE (A) NEGATIVE   RBC / HPF 0-5 0 - 5 RBC/hpf   WBC, UA  0-5 0 - 5 WBC/hpf   Bacteria, UA RARE (A) NONE SEEN   Squamous Epithelial / HPF 0-5 0 - 5 /HPF   Mucus PRESENT    Hyaline Casts, UA PRESENT   Type and screen [facility]     Status: None   Collection Time: 08/10/22 10:45 AM  Result Value Ref Range   ABO/RH(D) O NEG    Antibody Screen NEG    Sample Expiration      08/13/2022,2359 Performed at [facility], [address] [place], [place] [address]   Basic metabolic panel     Status: Abnormal   Collection Time: 08/10/22 10:46 AM  Result Value Ref Range   Sodium 138 135 - 145 mmol/L   Potassium 4.5 3.5 - 5.1 mmol/L   Chloride 109 98 - 111 mmol/L   CO2 21 (L) 22 - 32 mmol/L   Glucose, Bld 144 (H) 70 - 99 mg/dL   BUN 31 (H) 8 - 23 mg/dL   Creatinine, Ser 4.01 (H) 0.44 - 1.00 mg/dL   Calcium 9.2 8.9 - 02.7 mg/dL   GFR, Estimated 49 (L) >60 mL/min   Anion gap 8 5 - 15  CBC with Differential     Status: Abnormal   Collection Time:  08/10/22 10:46 AM  Result Value Ref Range   WBC 12.1 (H) 4.0 - 10.5 K/uL   RBC 4.21 3.87 - 5.11 MIL/uL   Hemoglobin 12.8 12.0 - 15.0 g/dL   HCT 17.5 10.2 - 58.5 %   MCV 92.6 80.0 - 100.0 fL   MCH 30.4 26.0 - 34.0 pg   MCHC 32.8 30.0 - 36.0 g/dL   RDW 27.7 82.4 - 23.5 %   Platelets 268 150 - 400 K/uL   nRBC 0.0 0.0 - 0.2 %   Neutrophils Relative % 80 %   Neutro Abs 9.7 (H) 1.7 - 7.7 K/uL   Lymphocytes Relative 11 %   Lymphs Abs 1.3 0.7 - 4.0 K/uL   Monocytes Relative 7 %   Monocytes Absolute 0.9 0.1 - 1.0 K/uL   Eosinophils Relative 1 %   Eosinophils Absolute 0.1 0.0 - 0.5 K/uL   Basophils Relative 0 %   Basophils Absolute 0.0 0.0 - 0.1 K/uL   Immature Granulocytes 1 %   Abs Immature Granulocytes 0.06 0.00 - 0.07 K/uL  Protime-INR     Status: Abnormal   Collection Time: 08/10/22 10:46 AM  Result Value Ref Range   Prothrombin Time 15.4 (H) 11.4 - 15.2 seconds   INR 1.2 0.8 - 1.2  ABO/Rh     Status: None   Collection Time: 08/10/22  2:37 PM  Result Value  Ref Range   ABO/RH(D)      [facility] NEG Performed at [facility], [address], [place], [place] [address]   CBC     Status: Abnormal   Collection Time: 08/10/22  2:37 PM  Result Value Ref Range   WBC 12.0 (H) 4.0 - 10.5 K/uL   RBC 3.93 3.87 - 5.11 MIL/uL   Hemoglobin 12.0 12.0 - 15.0 g/dL   HCT 31.5 40.0 - 86.7 %   MCV 94.4 80.0 - 100.0 fL   MCH 30.5 26.0 - 34.0 pg   MCHC 32.3 30.0 - 36.0 g/dL   RDW 61.9 50.9 - 32.6 %   Platelets 246 150 - 400 K/uL   nRBC 0.0 0.0 - 0.2 %  Creatinine, serum     Status: Abnormal   Collection Time: 08/10/22  2:37 PM  Result Value Ref Range   Creatinine, Ser 1.05 (H) 0.44 - 1.00 mg/dL   GFR, Estimated 59 (L) >60 mL/min     X-ray: CLINICAL DATA:  Fall, left hip fracture   EXAM: DG HIP (WITH OR WITHOUT PELVIS) 2-3V LEFT   COMPARISON:  None Available.   FINDINGS: Limited exam because of oblique positioning and body habitus. Deformity of the left hip noted compatible with a proximal left hip subcapital neck fracture.   Right hip intact. Included pelvis grossly normal. Obese body habitus.   IMPRESSION: Left hip subcapital neck fracture.     Electronically Signed   By: Judie Petit.  Shick M.D.  ROS: As admitting HPI  Blood pressure 103/67, pulse 60, temperature 97.7 F (36.5 C), temperature source Oral, resp. rate 16, height 5' 5.75" (1.67 m), weight 117.9 kg, SpO2 99 %.  Physical Exam: General:  Alert, oriented, calm, morbidly obese female in no acute distress, resting comfortably on her left side.  Her niece who is an [place] Charity fundraiser in [place] is present at the bedside as well. Eyes: EOMI, clear conjuctivae, white sclerea Neck: supple, trachea midline Cardiovascular: Sinus rhythm on monitor, extremities well-perfused Respiratory: Breathing comfortably on room air, no respiratory distress speaking in full sentences without difficulty Abdomen:  soft, nontender, nondistended, normal bowel tones heard  Skin: dry, no rashes   Psychiatric: appropriate affect, normal speech  Neurologic: extraocular muscles intact, clear speech, moving all extremities with intact sensorium   Left hip exam: ROM not stressed given radiographic evidence of hip fracture     NVI distally Able to move leg despite her fracture      Assessment/Plan: Left hip femoral neck fracture  Plan: Unfortunately her pain became more than she wanted to tolerate at home I will try to see if this can be addressed for her prior to Monday if she would like. Regular diet for now Will discuss with her and then reach out maybe to Dr Linna Caprice who is on call Saturday to see if he could help if she would like.  After discussing with Swinteck he is available to take care of her hip tomorrow morning  Regular diet today NPO after MN  Shelda Pal 08/10/2022, 9:04 PM

## 2022-08-10 NOTE — Consult Note (Signed)
Reason for Consult:left hip fracture Referring Physician: Kirby CriglerIkramullah, MD  Sande BrothersSusan P Niu is an 62 y.o. female.  HPI: 62 yo female seen in our office yesterday after a fall 2 weeks ago.  We decided to try and continue her current state until somewhat electively addressing her hip fracture on Monday.  Apparently her pain increased necessitating [facility] by [facility].  Past Medical History:  Diagnosis Date   Acute systolic HF (heart failure) (HCC)    Heart disease    Hyperglycemia    Morbid obesity (HCC)     No past surgical history on file.  Family History  Problem Relation Age of Onset   Hyperlipidemia Mother    Hypertension Mother    Heart attack Mother [phone]   Cancer Father        Prostate   Hypertension Father    Cancer Sister    Diabetes Sister    Hyperlipidemia Sister     Social History:  reports that she has never smoked. She has never used smokeless tobacco. She reports that she does not drink alcohol and does not use drugs.  Allergies: No Known Allergies  Medications: I have reviewed the patient's current medications. Scheduled:  carvedilol  25 mg Oral BID   docusate sodium  100 mg Oral BID   enoxaparin (LOVENOX) injection  60 mg Subcutaneous Q24H   sacubitril-valsartan  1 tablet Oral BID    Results for orders placed or performed during the hospital encounter of 08/10/22 (from the past 24 hour(s))  Urinalysis, Routine w reflex microscopic -Urine, Clean Catch     Status: Abnormal   Collection Time: 08/10/22 10:34 AM  Result Value Ref Range   Color, Urine YELLOW YELLOW   APPearance HAZY (A) CLEAR   Specific Gravity, Urine 1.021 1.005 - 1.030   pH 5.0 5.0 - 8.0   Glucose, UA NEGATIVE NEGATIVE mg/dL   Hgb urine dipstick NEGATIVE NEGATIVE   Bilirubin Urine NEGATIVE NEGATIVE   Ketones, ur 20 (A) NEGATIVE mg/dL   Protein, ur NEGATIVE NEGATIVE mg/dL   Nitrite POSITIVE (A) NEGATIVE   Leukocytes,Ua TRACE (A) NEGATIVE   RBC / HPF 0-5 0 - 5 RBC/hpf   WBC, UA  0-5 0 - 5 WBC/hpf   Bacteria, UA RARE (A) NONE SEEN   Squamous Epithelial / HPF 0-5 0 - 5 /HPF   Mucus PRESENT    Hyaline Casts, UA PRESENT   Type and screen [facility]     Status: None   Collection Time: 08/10/22 10:45 AM  Result Value Ref Range   ABO/RH(D) O NEG    Antibody Screen NEG    Sample Expiration      08/13/2022,2359 Performed at [facility], [address] [place], [place] [address]   Basic metabolic panel     Status: Abnormal   Collection Time: 08/10/22 10:46 AM  Result Value Ref Range   Sodium 138 135 - 145 mmol/L   Potassium 4.5 3.5 - 5.1 mmol/L   Chloride 109 98 - 111 mmol/L   CO2 21 (L) 22 - 32 mmol/L   Glucose, Bld 144 (H) 70 - 99 mg/dL   BUN 31 (H) 8 - 23 mg/dL   Creatinine, Ser 6.041.24 (H) 0.44 - 1.00 mg/dL   Calcium 9.2 8.9 - 54.010.3 mg/dL   GFR, Estimated 49 (L) >60 mL/min   Anion gap 8 5 - 15  CBC with Differential     Status: Abnormal   Collection Time:  08/10/22 10:46 AM  Result Value Ref Range   WBC 12.1 (H) 4.0 - 10.5 K/uL   RBC 4.21 3.87 - 5.11 MIL/uL   Hemoglobin 12.8 12.0 - 15.0 g/dL   HCT 17.5 10.2 - 58.5 %   MCV 92.6 80.0 - 100.0 fL   MCH 30.4 26.0 - 34.0 pg   MCHC 32.8 30.0 - 36.0 g/dL   RDW 27.7 82.4 - 23.5 %   Platelets 268 150 - 400 K/uL   nRBC 0.0 0.0 - 0.2 %   Neutrophils Relative % 80 %   Neutro Abs 9.7 (H) 1.7 - 7.7 K/uL   Lymphocytes Relative 11 %   Lymphs Abs 1.3 0.7 - 4.0 K/uL   Monocytes Relative 7 %   Monocytes Absolute 0.9 0.1 - 1.0 K/uL   Eosinophils Relative 1 %   Eosinophils Absolute 0.1 0.0 - 0.5 K/uL   Basophils Relative 0 %   Basophils Absolute 0.0 0.0 - 0.1 K/uL   Immature Granulocytes 1 %   Abs Immature Granulocytes 0.06 0.00 - 0.07 K/uL  Protime-INR     Status: Abnormal   Collection Time: 08/10/22 10:46 AM  Result Value Ref Range   Prothrombin Time 15.4 (H) 11.4 - 15.2 seconds   INR 1.2 0.8 - 1.2  ABO/Rh     Status: None   Collection Time: 08/10/22  2:37 PM  Result Value  Ref Range   ABO/RH(D)      Val Eagle NEG Performed at [facility], [address], [place], [place] [address]   CBC     Status: Abnormal   Collection Time: 08/10/22  2:37 PM  Result Value Ref Range   WBC 12.0 (H) 4.0 - 10.5 K/uL   RBC 3.93 3.87 - 5.11 MIL/uL   Hemoglobin 12.0 12.0 - 15.0 g/dL   HCT 31.5 40.0 - 86.7 %   MCV 94.4 80.0 - 100.0 fL   MCH 30.5 26.0 - 34.0 pg   MCHC 32.3 30.0 - 36.0 g/dL   RDW 61.9 50.9 - 32.6 %   Platelets 246 150 - 400 K/uL   nRBC 0.0 0.0 - 0.2 %  Creatinine, serum     Status: Abnormal   Collection Time: 08/10/22  2:37 PM  Result Value Ref Range   Creatinine, Ser 1.05 (H) 0.44 - 1.00 mg/dL   GFR, Estimated 59 (L) >60 mL/min     X-ray: CLINICAL DATA:  Fall, left hip fracture   EXAM: DG HIP (WITH OR WITHOUT PELVIS) 2-3V LEFT   COMPARISON:  None Available.   FINDINGS: Limited exam because of oblique positioning and body habitus. Deformity of the left hip noted compatible with a proximal left hip subcapital neck fracture.   Right hip intact. Included pelvis grossly normal. Obese body habitus.   IMPRESSION: Left hip subcapital neck fracture.     Electronically Signed   By: Judie Petit.  Shick M.D.  ROS: As admitting HPI  Blood pressure 103/67, pulse 60, temperature 97.7 F (36.5 C), temperature source Oral, resp. rate 16, height 5' 5.75" (1.67 m), weight 117.9 kg, SpO2 99 %.  Physical Exam: General:  Alert, oriented, calm, morbidly obese female in no acute distress, resting comfortably on her left side.  Her niece who is an [place] Charity fundraiser in [place] is present at the bedside as well. Eyes: EOMI, clear conjuctivae, white sclerea Neck: supple, trachea midline Cardiovascular: Sinus rhythm on monitor, extremities well-perfused Respiratory: Breathing comfortably on room air, no respiratory distress speaking in full sentences without difficulty Abdomen:  soft, nontender, nondistended, normal bowel tones heard  Skin: dry, no rashes   Psychiatric: appropriate affect, normal speech  Neurologic: extraocular muscles intact, clear speech, moving all extremities with intact sensorium   Left hip exam: ROM not stressed given radiographic evidence of hip fracture     NVI distally Able to move leg despite her fracture      Assessment/Plan: Left hip femoral neck fracture  Plan: Unfortunately her pain became more than she wanted to tolerate at home I will try to see if this can be addressed for her prior to Monday if she would like. Regular diet for now Will discuss with her and then reach out maybe to Dr Linna Caprice who is on call Saturday to see if he could help if she would like.  After discussing with Swinteck he is available to take care of her hip tomorrow morning  Regular diet today NPO after MN  Shelda Pal 08/10/2022, 9:04 PM

## 2022-08-10 NOTE — ED Notes (Signed)
ED TO INPATIENT HANDOFF REPORT  Name/Age/Gender Kimberly Lyons 62 y.o. female  Code Status    Code Status Orders  (From admission, onward)           Start     Ordered   08/10/22 1412  Full code  Continuous       Question:  By:  Answer:  Consent: discussion documented in EHR   08/10/22 1413           Code Status History     Date Active Date Inactive Code Status Order ID Comments User Context   07/28/2018 1450 07/30/2018 1801 Full Code [id number]  Rhetta Mura, MD ED       Home/SNF/Other Home  Chief Complaint Hip fracture, left, closed, initial encounter [S72.002A]  Level of Care/Admitting Diagnosis ED Disposition     ED Disposition  Admit   Condition  --   Comment  Hospital Area: [facility] [[id number]]  Level of Care: Med-Surg [16]  May admit patient to [facility] or [facility] if equivalent level of care is available:: Yes  Covid Evaluation: Asymptomatic - no recent exposure (last 10 days) testing not required  Diagnosis: Hip fracture, left, closed, initial encounter [1001795]  Admitting Physician: Maryln Gottron [[id number]]  Attending Physician: [facility], MIR [email or web address] [[id number]]  Certification:: I certify this patient will need inpatient services for at least 2 midnights  Estimated Length of Stay: 3          Medical History Past Medical History:  Diagnosis Date   Acute systolic HF (heart failure) (HCC)    Heart disease    Hyperglycemia    Morbid obesity (HCC)     Allergies No Known Allergies  IV Location/Drains/Wounds Patient Lines/Drains/Airways Status     Active Line/Drains/Airways     Name Placement date Placement time Site Days   Peripheral IV 08/10/22 20 G 1" Left Antecubital 08/10/22  --  Antecubital  less than 1            Labs/Imaging Results for orders placed or performed during the hospital encounter of 08/10/22 (from the past 48 hour(s))  Type and screen [facility]     Status:  None   Collection Time: 08/10/22 10:45 AM  Result Value Ref Range   ABO/RH(D) O NEG    Antibody Screen NEG    Sample Expiration      08/13/2022,2359 Performed at [facility], [address], [place], [place] [address]   Basic metabolic panel     Status: Abnormal   Collection Time: 08/10/22 10:46 AM  Result Value Ref Range   Sodium 138 135 - 145 mmol/L   Potassium 4.5 3.5 - 5.1 mmol/L   Chloride 109 98 - 111 mmol/L   CO2 21 (L) 22 - 32 mmol/L   Glucose, Bld 144 (H) 70 - 99 mg/dL    Comment: Glucose reference range applies only to samples taken after fasting for at least 8 hours.   BUN 31 (H) 8 - 23 mg/dL   Creatinine, Ser 9.15 (H) 0.44 - 1.00 mg/dL   Calcium 9.2 8.9 - 05.6 mg/dL   GFR, Estimated 49 (L) >60 mL/min    Comment: (NOTE) Calculated using the CKD-EPI Creatinine Equation (2021)    Anion gap 8 5 - 15    Comment: Performed at [facility], [address] [place], [place] [address]  CBC with Differential     Status: Abnormal   Collection Time: 08/10/22 10:46  AM  Result Value Ref Range   WBC 12.1 (H) 4.0 - 10.5 K/uL   RBC 4.21 3.87 - 5.11 MIL/uL   Hemoglobin 12.8 12.0 - 15.0 g/dL   HCT 95.2 84.1 - 32.4 %   MCV 92.6 80.0 - 100.0 fL   MCH 30.4 26.0 - 34.0 pg   MCHC 32.8 30.0 - 36.0 g/dL   RDW 40.1 02.7 - 25.3 %   Platelets 268 150 - 400 K/uL   nRBC 0.0 0.0 - 0.2 %   Neutrophils Relative % 80 %   Neutro Abs 9.7 (H) 1.7 - 7.7 K/uL   Lymphocytes Relative 11 %   Lymphs Abs 1.3 0.7 - 4.0 K/uL   Monocytes Relative 7 %   Monocytes Absolute 0.9 0.1 - 1.0 K/uL   Eosinophils Relative 1 %   Eosinophils Absolute 0.1 0.0 - 0.5 K/uL   Basophils Relative 0 %   Basophils Absolute 0.0 0.0 - 0.1 K/uL   Immature Granulocytes 1 %   Abs Immature Granulocytes 0.06 0.00 - 0.07 K/uL    Comment: Performed at [facility], [address], [place], [place] [address]  Protime-INR     Status: Abnormal   Collection Time: 08/10/22  10:46 AM  Result Value Ref Range   Prothrombin Time 15.4 (H) 11.4 - 15.2 seconds   INR 1.2 0.8 - 1.2    Comment: (NOTE) INR goal varies based on device and disease states. Performed at [facility], [address] [place], [place] [address]   CBC     Status: Abnormal   Collection Time: 08/10/22  2:37 PM  Result Value Ref Range   WBC 12.0 (H) 4.0 - 10.5 K/uL   RBC 3.93 3.87 - 5.11 MIL/uL   Hemoglobin 12.0 12.0 - 15.0 g/dL   HCT 59.5 63.8 - 75.6 %   MCV 94.4 80.0 - 100.0 fL   MCH 30.5 26.0 - 34.0 pg   MCHC 32.3 30.0 - 36.0 g/dL   RDW 43.3 29.5 - 18.8 %   Platelets 246 150 - 400 K/uL   nRBC 0.0 0.0 - 0.2 %    Comment: Performed at [facility], [address], [place], [place] [address]  Creatinine, serum     Status: Abnormal   Collection Time: 08/10/22  2:37 PM  Result Value Ref Range   Creatinine, Ser 1.05 (H) 0.44 - 1.00 mg/dL   GFR, Estimated 59 (L) >60 mL/min    Comment: (NOTE) Calculated using the CKD-EPI Creatinine Equation (2021) Performed at [facility], [address], [place], [place] [address]    DG Knee Complete 4 Views Left  Result Date: 08/10/2022 CLINICAL DATA:  Left hip fracture, fall, pain EXAM: LEFT KNEE - COMPLETE 4+ VIEW COMPARISON:  08/10/2022 FINDINGS: Also limited because of positioning. Bones are osteopenic. Tricompartmental left knee osteoarthritis noted with joint space loss, sclerosis, and osteophyte formation. No acute displaced fracture, malalignment, or large effusion. No focal soft tissue abnormality. IMPRESSION: 1. Osteopenia and tricompartmental left knee osteoarthritis. 2. No acute osseous finding by plain radiography. Electronically Signed   By: Judie Petit.  Shick M.D.   On: 08/10/2022 11:51   DG Hip Unilat With Pelvis 2-3 Views Left  Result Date: 08/10/2022 CLINICAL DATA:  Fall, left hip fracture EXAM: DG HIP (WITH OR WITHOUT PELVIS) 2-3V LEFT COMPARISON:  None Available. FINDINGS: Limited exam  because of oblique positioning and body habitus. Deformity of the left hip noted compatible with a proximal left hip  subcapital neck fracture. Right hip intact. Included pelvis grossly normal. Obese body habitus. IMPRESSION: Left hip subcapital neck fracture. Electronically Signed   By: Judie PetitM.  Shick M.D.   On: 08/10/2022 11:50   DG Chest 2 View  Result Date: 08/10/2022 CLINICAL DATA:  Fall, left hip fracture EXAM: CHEST - 2 VIEW COMPARISON:  07/29/2018 FINDINGS: Limited exam with rotation to the left. Heart is enlarged. No acute CHF or definite pneumonia. No large effusion or pneumothorax. Lateral view is also limited with rotation and obliquity. IMPRESSION: Cardiomegaly without acute process. Limited exam because of positioning. Electronically Signed   By: Judie PetitM.  Shick M.D.   On: 08/10/2022 11:48    Pending Labs Unresulted Labs (From admission, onward)     Start     Ordered   08/17/22 0500  Creatinine, serum  (enoxaparin (LOVENOX)    CrCl >/= 30 ml/min)  Weekly,   R     Comments: while on enoxaparin therapy    08/10/22 1413   08/11/22 0500  Basic metabolic panel  Tomorrow morning,   R        08/10/22 1413   08/11/22 0500  CBC  Tomorrow morning,   R        08/10/22 1413   08/10/22 1412  HIV Antibody (routine testing w rflx)  (HIV Antibody (Routine testing w reflex) panel)  Once,   R        08/10/22 1413   08/10/22 1223  ABO/Rh  Once,   R        08/10/22 1223   08/10/22 1034  Urinalysis, Routine w reflex microscopic -Urine, Clean Catch  Once,   URGENT       Question:  Specimen Source  Answer:  Urine, Clean Catch   08/10/22 1034            Vitals/Pain Today's Vitals   08/10/22 1038 08/10/22 1050 08/10/22 1238  BP: 96/72  116/72  Pulse: (!) 57  69  Resp: 18  18  Temp: 97.9 F (36.6 C)  97.6 F (36.4 C)  TempSrc: Oral  Oral  SpO2: 100%  98%  Weight:  117.9 kg   Height:  5' 5.75" (1.67 m)   PainSc:  9  9     Isolation Precautions No active isolations  Medications Medications   morphine (PF) 4 MG/ML injection 4 mg (4 mg Intravenous Given 08/10/22 1056)  HYDROmorphone (DILAUDID) injection 1 mg (has no administration in time range)  oxyCODONE (Oxy IR/ROXICODONE) immediate release tablet 5 mg (5 mg Oral Given 08/10/22 1435)  carvedilol (COREG) tablet 25 mg (has no administration in time range)  sacubitril-valsartan (ENTRESTO) 97-103 mg per tablet (has no administration in time range)  enoxaparin (LOVENOX) injection 40 mg (has no administration in time range)  docusate sodium (COLACE) capsule 100 mg (100 mg Oral Given 08/10/22 1435)  polyethylene glycol (MIRALAX / GLYCOLAX) packet 17 g (has no administration in time range)  ondansetron (ZOFRAN) tablet 4 mg (has no administration in time range)    Or  ondansetron (ZOFRAN) injection 4 mg (has no administration in time range)  albuterol (PROVENTIL) (2.5 MG/3ML) 0.083% nebulizer solution 2.5 mg (has no administration in time range)  ondansetron (ZOFRAN) injection 4 mg (4 mg Intravenous Given 08/10/22 1056)  HYDROmorphone (DILAUDID) injection 1 mg (1 mg Intravenous Given 08/10/22 1239)    Mobility walks with person assist

## 2022-08-10 NOTE — Plan of Care (Signed)
  Problem: Nutrition: Goal: Adequate nutrition will be maintained Outcome: Progressing   Problem: Pain Managment: Goal: General experience of comfort will improve Outcome: Progressing   

## 2022-08-11 DIAGNOSIS — I959 Hypotension, unspecified: Secondary | ICD-10-CM | POA: Diagnosis not present

## 2022-08-11 DIAGNOSIS — I5022 Chronic systolic (congestive) heart failure: Secondary | ICD-10-CM | POA: Diagnosis not present

## 2022-08-11 DIAGNOSIS — S72002A Fracture of unspecified part of neck of left femur, initial encounter for closed fracture: Secondary | ICD-10-CM | POA: Diagnosis not present

## 2022-08-11 LAB — SURGICAL PCR SCREEN
MRSA, PCR: NEGATIVE
Staphylococcus aureus: NEGATIVE

## 2022-08-11 LAB — CBC
HCT: 37.5 % (ref 36.0–46.0)
Hemoglobin: 12.1 g/dL (ref 12.0–15.0)
MCH: 30.9 pg (ref 26.0–34.0)
MCHC: 32.3 g/dL (ref 30.0–36.0)
MCV: 95.9 fL (ref 80.0–100.0)
Platelets: 232 10*3/uL (ref 150–400)
RBC: 3.91 MIL/uL (ref 3.87–5.11)
RDW: 13.9 % (ref 11.5–15.5)
WBC: 9.1 10*3/uL (ref 4.0–10.5)
nRBC: 0 % (ref 0.0–0.2)

## 2022-08-11 LAB — BASIC METABOLIC PANEL
Anion gap: 9 (ref 5–15)
BUN: 27 mg/dL — ABNORMAL HIGH (ref 8–23)
CO2: 23 mmol/L (ref 22–32)
Calcium: 9 mg/dL (ref 8.9–10.3)
Chloride: 109 mmol/L (ref 98–111)
Creatinine, Ser: 1.14 mg/dL — ABNORMAL HIGH (ref 0.44–1.00)
GFR, Estimated: 54 mL/min — ABNORMAL LOW (ref >60)
Glucose, Bld: 138 mg/dL — ABNORMAL HIGH (ref 70–99)
Potassium: 4.4 mmol/L (ref 3.5–5.1)
Sodium: 141 mmol/L (ref 135–145)

## 2022-08-11 MED ORDER — HYDROMORPHONE HCL 1 MG/ML IJ SOLN: 1.0000 mg | Freq: Once | INTRAMUSCULAR | Status: DC

## 2022-08-11 NOTE — Plan of Care (Signed)
  Problem: Education: Goal: Knowledge of General Education information will improve Description Including pain rating scale, medication(s)/side effects and non-pharmacologic comfort measures Outcome: Progressing   Problem: Clinical Measurements: Goal: Respiratory complications will improve Outcome: Not Applicable   Problem: Nutrition: Goal: Adequate nutrition will be maintained Outcome: Progressing   Problem: Pain Managment: Goal: General experience of comfort will improve Outcome: Progressing   

## 2022-08-11 NOTE — TOC Initial Note (Signed)
Transition of Care [facility]) - Initial/Assessment Note    Patient Details  Name: Kimberly Lyons MRN: [id number] Date of Birth: October 27, 1960  Transition of Care [facility]) CM/SW Contact:    Kimberly Jupiter, LCSW Phone Number: 08/11/2022, 3:06 PM  Clinical Narrative:                  Met with pt today to introduce TOC/ CSW role with dc planning.  Pt very pleasant and eager to have surgery  - planned for tomorrow.  She is hopeful she will do well enough with PT eval to be able to return home but she does live alone.  Her family has set up a twin bed on 1st floor to prepare. She notes that she is not opposed to SNF rehab if it is recommended but we will await PT evals.  Expected Discharge Plan: Home w Home Health Services Barriers to Discharge: Continued Medical Work up   Patient Goals and CMS Choice Patient states their goals for this hospitalization and ongoing recovery are:: return home          Expected Discharge Plan and Services In-house Referral: Clinical Social Work     Living arrangements for the past 2 months: Single Family Home                                      Prior Living Arrangements/Services Living arrangements for the past 2 months: Single Family Home Lives with:: Self Patient language and need for interpreter reviewed:: Yes Do you feel safe going back to the place where you live?: Yes      Need for Family Participation in Patient Care: Yes (Comment) Care giver support system in place?: Yes (comment)   Criminal Activity/Legal Involvement Pertinent to Current Situation/Hospitalization: No - Comment as needed  Activities of Daily Living Home Assistive Devices/Equipment: Contact lenses ADL Screening (condition at time of admission) Patient's cognitive ability adequate to safely complete daily activities?: Yes Is the patient deaf or have difficulty hearing?: No Does the patient have difficulty seeing, even when wearing glasses/contacts?: No Does the patient have  difficulty concentrating, remembering, or making decisions?: No Patient able to express need for assistance with ADLs?: Yes Does the patient have difficulty dressing or bathing?: No Independently performs ADLs?: Yes (appropriate for developmental age) Does the patient have difficulty walking or climbing stairs?: Yes Weakness of Legs: Left Weakness of Arms/Hands: None  Permission Sought/Granted Permission sought to share information with : Family Supports Permission granted to share information with : Yes, Verbal Permission Granted  Share Information with NAME: daughter, Kimberly Lyons @ [phone]           Emotional Assessment              Admission diagnosis:  Fall, initial encounter [W19.XXXA] Closed fracture of neck of left femur, initial encounter [S72.002A] Hip fracture, left, closed, initial encounter [S72.002A] Patient Active Problem List   Diagnosis Date Noted   Hip fracture, left, closed, initial encounter 08/10/2022   Chronic systolic HF (heart failure) 07/16/2019   Hyperglycemia 02/12/2019   Morbid obesity 02/12/2019   AKI (acute kidney injury) 09/05/2018   Medication management 09/05/2018   Educated about COVID-19 virus infection 08/27/2018   Essential hypertension 08/27/2018   BMI 45.0-49.9, adult 08/26/2018   Prediabetes 08/26/2018   Heart failure 07/28/2018   Acute heart failure 07/28/2018   PCP:  Kimberly Georges, MD Pharmacy:  [id number] [address] [place] [address] Phone: [phone] Fax: [phone]     Social Determinants of Health (SDOH) Social History: SDOH Screenings   Food Insecurity: No Food Insecurity (08/10/2022)  Housing: Low Risk  (08/10/2022)  Transportation Needs: No Transportation Needs (08/10/2022)  Utilities: Not At Risk (08/10/2022)  Depression (PHQ2-9): Low Risk  (06/23/2022)  Tobacco Use: Low Risk  (06/23/2022)   SDOH Interventions:     Readmission Risk  Interventions    08/11/2022    2:27 PM  Readmission Risk Prevention Plan  Post Dischage Appt Complete  Medication Screening Complete  Transportation Screening Complete

## 2022-08-11 NOTE — Progress Notes (Addendum)
[facility] PROGRESS NOTE   Kimberly Lyons IEP:[id number] DOB: 1960-06-08 DOA: 08/10/2022  PCP: Kimberly Georges, MD  Brief History/Interval Summary: 62 y.o. female with medical history significant for systolic heart failure on Entresto, non-insulin-dependent diabetes recently started on metformin, morbid obesity admitted to the hospital with left hip fracture after mechanical fall.    Consultants: Orthopedics  Procedures: None yet    Subjective/Interval History: Patient feels well.  Denies any dizziness, lightheadedness, chest pain or shortness of breath.  No leg swelling.  Pain in the left hip is reasonably well-controlled.    Assessment/Plan:  Left hip fracture Secondary to mechanical fall.  She does have a history of CHF but it seems to be well-controlled.  Last echocardiogram from 2022 showed normal systolic function.  Do not think she needs any cardiac workup prior to undergoing surgery. DVT Kimberly Lyons postoperatively. It looks like orthopedics is planning operation tomorrow.  Chronic systolic CHF/hypotension Followed by Kimberly Lyons. She used to have left ventricular EF of 30 to 35% back in 2020.  She was on carvedilol and Entresto.  Last echo from 2022 showed improvement in left ventricular EF to 60 to 65%. She is noted to be somewhat hypotensive which she mentioned that she always runs low.  Due to need for anesthesia and surgery we will hold her Entresto and carvedilol for now.  Monitor blood pressures closely.   Otherwise she seems to be well compensated.  Acute kidney injury Baseline renal function is normal.  Aldactone placed on hold.  Will also hold Entresto and carvedilol.  Creatinine is better today.  Monitor urine output.  Avoid nephrotoxic agents.  Obesity Estimated body mass index is 42.28 kg/m as calculated from the following:   Height as of this encounter: 5' 5.75" (1.67 m).   Weight as of this encounter: 117.9 kg.   DVT Prophylaxis: Lovenox will  be changed to SCDs for now.  Definitive prophylaxis can again be initiated after surgery per orthopedic input. Code Status: Full code Family Communication: Discussed with patient Disposition Plan: To be determined  Status is: Inpatient Remains inpatient appropriate because: Hip fracture      Medications: Scheduled:  docusate sodium  100 mg Oral BID   enoxaparin (LOVENOX) injection  60 mg Subcutaneous Q24H   Continuous: [id number], HYDROmorphone (DILAUDID) injection, morphine injection, ondansetron **OR** ondansetron (ZOFRAN) IV, oxyCODONE, polyethylene glycol  Antibiotics: Anti-infectives (From admission, onward)    None       Objective:  Vital Signs  Vitals:   08/10/22 1712 08/10/22 2211 08/11/22 0510 08/11/22 0908  BP: 103/67 (!) 91/46 (!) 92/51 (!) 109/57  Pulse: 60 66 62 69  Resp: 16 18 16 18   Temp: 97.7 F (36.5 C) 98.1 F (36.7 C) 98 F (36.7 C) 98 F (36.7 C)  TempSrc: Oral Oral Oral Oral  SpO2: 99% 98% 97% 97%  Weight:      Height:       No intake or output data in the 24 hours ending 08/11/22 0920 Filed Weights   08/10/22 1050  Weight: 117.9 kg    General appearance: Awake alert.  In no distress Resp: Clear to auscultation bilaterally.  Normal effort Cardio: S1-S2 is normal regular.  No S3-S4.  No rubs murmurs or bruit GI: Abdomen is soft.  Nontender nondistended.  Bowel sounds are present normal.  No masses organomegaly Extremities: No edema.  Neurologic: Alert and oriented x3.  No focal neurological deficits.    Lab Results:  Data Reviewed: I have personally  reviewed following labs and reports of the imaging studies  CBC: Recent Labs  Lab 08/10/22 1046 08/10/22 1437 08/11/22 0406  WBC 12.1* 12.0* 9.1  NEUTROABS 9.7*  --   --   HGB 12.8 12.0 12.1  HCT 39.0 37.1 37.5  MCV 92.6 94.4 95.9  PLT 268 246 232    Basic Metabolic Panel: Recent Labs  Lab 08/10/22 1046 08/10/22 1437 08/11/22 0406  NA 138  --  141  K 4.5  --  4.4   CL 109  --  109  CO2 21*  --  23  GLUCOSE 144*  --  138*  BUN 31*  --  27*  CREATININE 1.24* 1.05* 1.14*  CALCIUM 9.2  --  9.0    GFR: Estimated Creatinine Clearance: 64.9 mL/min (A) (by C-G formula based on SCr of 1.14 mg/dL (H)).  Coagulation Profile: Recent Labs  Lab 08/10/22 1046  INR 1.2    Cardiac Enzymes: No results for input(s): "CKTOTAL", "CKMB", "CKMBINDEX", "TROPONINI" in the last 168 hours.   Radiology Studies: DG Knee Complete 4 Views Left  Result Date: 08/10/2022 CLINICAL DATA:  Left hip fracture, fall, pain EXAM: LEFT KNEE - COMPLETE 4+ VIEW COMPARISON:  08/10/2022 FINDINGS: Also limited because of positioning. Bones are osteopenic. Tricompartmental left knee osteoarthritis noted with joint space loss, sclerosis, and osteophyte formation. No acute displaced fracture, malalignment, or large effusion. No focal soft tissue abnormality. IMPRESSION: 1. Osteopenia and tricompartmental left knee osteoarthritis. 2. No acute osseous finding by plain radiography. Electronically Signed   By: Judie Petit.  Shick M.D.   On: 08/10/2022 11:51   DG Hip Unilat With Pelvis 2-3 Views Left  Result Date: 08/10/2022 CLINICAL DATA:  Fall, left hip fracture EXAM: DG HIP (WITH OR WITHOUT PELVIS) 2-3V LEFT COMPARISON:  None Available. FINDINGS: Limited exam because of oblique positioning and body habitus. Deformity of the left hip noted compatible with a proximal left hip subcapital neck fracture. Right hip intact. Included pelvis grossly normal. Obese body habitus. IMPRESSION: Left hip subcapital neck fracture. Electronically Signed   By: Judie Petit.  Shick M.D.   On: 08/10/2022 11:50   DG Chest 2 View  Result Date: 08/10/2022 CLINICAL DATA:  Fall, left hip fracture EXAM: CHEST - 2 VIEW COMPARISON:  07/29/2018 FINDINGS: Limited exam with rotation to the left. Heart is enlarged. No acute CHF or definite pneumonia. No large effusion or pneumothorax. Lateral view is also limited with rotation and obliquity.  IMPRESSION: Cardiomegaly without acute process. Limited exam because of positioning. Electronically Signed   By: Judie Petit.  Shick M.D.   On: 08/10/2022 11:48       LOS: 1 day   Kimberly Lyons  [facility] Pager on [email or web address]  08/11/2022, 9:20 AM

## 2022-08-12 ENCOUNTER — Encounter (HOSPITAL_COMMUNITY): Payer: Self-pay | Admitting: Internal Medicine

## 2022-08-12 ENCOUNTER — Inpatient Hospital Stay (HOSPITAL_COMMUNITY): Payer: 59

## 2022-08-12 ENCOUNTER — Inpatient Hospital Stay (HOSPITAL_COMMUNITY): Payer: 59 | Admitting: Anesthesiology

## 2022-08-12 ENCOUNTER — Encounter (HOSPITAL_COMMUNITY)
Admission: EM | Disposition: A | Discharge status: Home / Self Care | Payer: Self-pay | Source: Home / Self Care | Attending: Internal Medicine

## 2022-08-12 DIAGNOSIS — I959 Hypotension, unspecified: Secondary | ICD-10-CM | POA: Diagnosis not present

## 2022-08-12 DIAGNOSIS — I10 Essential (primary) hypertension: Secondary | ICD-10-CM | POA: Diagnosis not present

## 2022-08-12 DIAGNOSIS — S72002A Fracture of unspecified part of neck of left femur, initial encounter for closed fracture: Secondary | ICD-10-CM

## 2022-08-12 DIAGNOSIS — Z6841 Body Mass Index (BMI) 40.0 and over, adult: Secondary | ICD-10-CM | POA: Diagnosis not present

## 2022-08-12 DIAGNOSIS — I5022 Chronic systolic (congestive) heart failure: Secondary | ICD-10-CM | POA: Diagnosis not present

## 2022-08-12 HISTORY — PX: TOTAL HIP ARTHROPLASTY: SHX124

## 2022-08-12 SURGERY — ARTHROPLASTY, HIP, TOTAL, ANTERIOR APPROACH
Anesthesia: General | Site: Hip | Laterality: Left

## 2022-08-12 MED ORDER — KETOROLAC TROMETHAMINE 30 MG/ML IJ SOLN
INTRAMUSCULAR | Status: DC | PRN
  Administered 2022-08-12: 30 mg

## 2022-08-12 MED ORDER — LIDOCAINE 2% (20 MG/ML) 5 ML SYRINGE
INTRAMUSCULAR | Status: DC | PRN
  Administered 2022-08-12: 100 mg via INTRAVENOUS

## 2022-08-12 MED ORDER — OXYCODONE HCL 5 MG PO TABS
ORAL_TABLET | ORAL | Status: AC
  Filled 2022-08-12: qty 1

## 2022-08-12 MED ORDER — OXYCODONE HCL 5 MG/5ML PO SOLN: 5.0000 mg | Freq: Once | ORAL | Status: AC | PRN

## 2022-08-12 MED ORDER — CEFAZOLIN SODIUM-DEXTROSE 2-4 GM/100ML-% IV SOLN
2.0000 g | Freq: Four times a day (QID) | INTRAVENOUS | Status: AC
  Administered 2022-08-12 (×2): 2 g via INTRAVENOUS
  Filled 2022-08-12 (×2): qty 100

## 2022-08-12 MED ORDER — ROCURONIUM BROMIDE 10 MG/ML (PF) SYRINGE
PREFILLED_SYRINGE | INTRAVENOUS | Status: AC
  Filled 2022-08-12: qty 10

## 2022-08-12 MED ORDER — METHOCARBAMOL 500 MG PO TABS
500.0000 mg | ORAL_TABLET | Freq: Four times a day (QID) | ORAL | Status: DC | PRN
  Administered 2022-08-12 – 2022-08-14 (×7): 500 mg via ORAL
  Filled 2022-08-12 (×7): qty 1

## 2022-08-12 MED ORDER — HYDROMORPHONE HCL 1 MG/ML IJ SOLN
INTRAMUSCULAR | Status: AC
  Filled 2022-08-12: qty 2

## 2022-08-12 MED ORDER — DEXAMETHASONE SODIUM PHOSPHATE 10 MG/ML IJ SOLN
INTRAMUSCULAR | Status: AC
  Filled 2022-08-12: qty 1

## 2022-08-12 MED ORDER — SENNA 8.6 MG PO TABS
1.0000 | ORAL_TABLET | Freq: Two times a day (BID) | ORAL | Status: DC
  Administered 2022-08-12 – 2022-08-14 (×4): 8.6 mg via ORAL
  Filled 2022-08-12 (×4): qty 1

## 2022-08-12 MED ORDER — HYDROMORPHONE HCL 1 MG/ML IJ SOLN
0.2500 mg | INTRAMUSCULAR | Status: DC | PRN
  Administered 2022-08-12 (×2): 0.5 mg via INTRAVENOUS

## 2022-08-12 MED ORDER — DEXAMETHASONE SODIUM PHOSPHATE 10 MG/ML IJ SOLN
INTRAMUSCULAR | Status: DC | PRN
  Administered 2022-08-12: 10 mg via INTRAVENOUS

## 2022-08-12 MED ORDER — PROPOFOL 10 MG/ML IV BOLUS
INTRAVENOUS | Status: AC
  Filled 2022-08-12: qty 20

## 2022-08-12 MED ORDER — POLYETHYLENE GLYCOL 3350 17 G PO PACK: 17.0000 g | PACK | Freq: Every day | ORAL | Status: DC | PRN

## 2022-08-12 MED ORDER — DOCUSATE SODIUM 100 MG PO CAPS
100.0000 mg | ORAL_CAPSULE | Freq: Two times a day (BID) | ORAL | Status: DC
  Administered 2022-08-12 – 2022-08-14 (×4): 100 mg via ORAL
  Filled 2022-08-12 (×4): qty 1

## 2022-08-12 MED ORDER — WATER FOR IRRIGATION, STERILE IR SOLN
Status: DC | PRN
  Administered 2022-08-12: 2000 mL

## 2022-08-12 MED ORDER — BUPIVACAINE-EPINEPHRINE 0.25% -1:200000 IJ SOLN
INTRAMUSCULAR | Status: DC | PRN
  Administered 2022-08-12: 30 mL

## 2022-08-12 MED ORDER — BUPIVACAINE-EPINEPHRINE (PF) 0.25% -1:200000 IJ SOLN
INTRAMUSCULAR | Status: AC
  Filled 2022-08-12: qty 30

## 2022-08-12 MED ORDER — OXYCODONE HCL 5 MG PO TABS
5.0000 mg | ORAL_TABLET | ORAL | Status: DC | PRN
  Administered 2022-08-12 – 2022-08-14 (×7): 5 mg via ORAL
  Filled 2022-08-12 (×7): qty 1

## 2022-08-12 MED ORDER — ONDANSETRON HCL 4 MG/2ML IJ SOLN
INTRAMUSCULAR | Status: AC
  Filled 2022-08-12: qty 2

## 2022-08-12 MED ORDER — METOCLOPRAMIDE HCL 5 MG/ML IJ SOLN: 5.0000 mg | Freq: Three times a day (TID) | INTRAMUSCULAR | Status: DC | PRN

## 2022-08-12 MED ORDER — PROPOFOL 10 MG/ML IV BOLUS
INTRAVENOUS | Status: DC | PRN
  Administered 2022-08-12: 200 mg via INTRAVENOUS

## 2022-08-12 MED ORDER — ONDANSETRON HCL 4 MG/2ML IJ SOLN: 4.0000 mg | Freq: Four times a day (QID) | INTRAMUSCULAR | Status: DC | PRN

## 2022-08-12 MED ORDER — LACTATED RINGERS IV SOLN: INTRAVENOUS | Status: DC | PRN

## 2022-08-12 MED ORDER — ISOPROPYL ALCOHOL 70 % SOLN
Status: DC | PRN
  Administered 2022-08-12: 1 via TOPICAL

## 2022-08-12 MED ORDER — SODIUM CHLORIDE 0.9 % IR SOLN
Status: DC | PRN
  Administered 2022-08-12: 3000 mL

## 2022-08-12 MED ORDER — ASPIRIN 81 MG PO CHEW
81.0000 mg | CHEWABLE_TABLET | Freq: Two times a day (BID) | ORAL | Status: DC
  Administered 2022-08-12 – 2022-08-14 (×4): 81 mg via ORAL
  Filled 2022-08-12 (×4): qty 1

## 2022-08-12 MED ORDER — KETOROLAC TROMETHAMINE 30 MG/ML IJ SOLN
INTRAMUSCULAR | Status: AC
  Filled 2022-08-12: qty 1

## 2022-08-12 MED ORDER — SUGAMMADEX SODIUM 200 MG/2ML IV SOLN
INTRAVENOUS | Status: DC | PRN
  Administered 2022-08-12: 200 mg via INTRAVENOUS

## 2022-08-12 MED ORDER — ISOPROPYL ALCOHOL 70 % SOLN
Status: AC
  Filled 2022-08-12: qty 480

## 2022-08-12 MED ORDER — MIDAZOLAM HCL 5 MG/5ML IJ SOLN
INTRAMUSCULAR | Status: DC | PRN
  Administered 2022-08-12: 2 mg via INTRAVENOUS

## 2022-08-12 MED ORDER — ALUM & MAG HYDROXIDE-SIMETH 200-200-20 MG/5ML PO SUSP: 30.0000 mL | ORAL | Status: DC | PRN

## 2022-08-12 MED ORDER — PHENOL 1.4 % MT LIQD: 1.0000 | OROMUCOSAL | Status: DC | PRN

## 2022-08-12 MED ORDER — MENTHOL 3 MG MT LOZG: 1.0000 | LOZENGE | OROMUCOSAL | Status: DC | PRN

## 2022-08-12 MED ORDER — AMISULPRIDE (ANTIEMETIC) 5 MG/2ML IV SOLN: 10.0000 mg | Freq: Once | INTRAVENOUS | Status: DC | PRN

## 2022-08-12 MED ORDER — ONDANSETRON HCL 4 MG PO TABS: 4.0000 mg | ORAL_TABLET | Freq: Four times a day (QID) | ORAL | Status: DC | PRN

## 2022-08-12 MED ORDER — SODIUM CHLORIDE (PF) 0.9 % IJ SOLN
INTRAMUSCULAR | Status: DC | PRN
  Administered 2022-08-12: 30 mL

## 2022-08-12 MED ORDER — METOCLOPRAMIDE HCL 5 MG PO TABS: 5.0000 mg | ORAL_TABLET | Freq: Three times a day (TID) | ORAL | Status: DC | PRN

## 2022-08-12 MED ORDER — FENTANYL CITRATE (PF) 100 MCG/2ML IJ SOLN
INTRAMUSCULAR | Status: DC | PRN
  Administered 2022-08-12 (×2): 50 ug via INTRAVENOUS

## 2022-08-12 MED ORDER — CEFAZOLIN SODIUM-DEXTROSE 2-4 GM/100ML-% IV SOLN
INTRAVENOUS | Status: AC
  Filled 2022-08-12: qty 100

## 2022-08-12 MED ORDER — PHENYLEPHRINE 80 MCG/ML (10ML) SYRINGE FOR IV PUSH (FOR BLOOD PRESSURE SUPPORT)
PREFILLED_SYRINGE | INTRAVENOUS | Status: DC | PRN
  Administered 2022-08-12 (×2): 160 ug via INTRAVENOUS
  Administered 2022-08-12: 80 ug via INTRAVENOUS

## 2022-08-12 MED ORDER — PRONTOSAN WOUND IRRIGATION OPTIME
TOPICAL | Status: DC | PRN
  Administered 2022-08-12 (×2): 1 via TOPICAL

## 2022-08-12 MED ORDER — SODIUM CHLORIDE (PF) 0.9 % IJ SOLN
INTRAMUSCULAR | Status: AC
  Filled 2022-08-12: qty 50

## 2022-08-12 MED ORDER — ROCURONIUM BROMIDE 10 MG/ML (PF) SYRINGE
PREFILLED_SYRINGE | INTRAVENOUS | Status: DC | PRN
  Administered 2022-08-12: 100 mg via INTRAVENOUS

## 2022-08-12 MED ORDER — SODIUM CHLORIDE 0.9 % IV SOLN: INTRAVENOUS | Status: DC

## 2022-08-12 MED ORDER — MIDAZOLAM HCL 2 MG/2ML IJ SOLN
INTRAMUSCULAR | Status: AC
  Filled 2022-08-12: qty 2

## 2022-08-12 MED ORDER — TRANEXAMIC ACID-NACL 1000-0.7 MG/100ML-% IV SOLN
INTRAVENOUS | Status: DC | PRN
  Administered 2022-08-12: 1000 mg via INTRAVENOUS

## 2022-08-12 MED ORDER — PROMETHAZINE HCL 25 MG/ML IJ SOLN: 6.2500 mg | INTRAMUSCULAR | Status: DC | PRN

## 2022-08-12 MED ORDER — DIPHENHYDRAMINE HCL 12.5 MG/5ML PO ELIX: 12.5000 mg | ORAL_SOLUTION | ORAL | Status: DC | PRN

## 2022-08-12 MED ORDER — TRANEXAMIC ACID-NACL 1000-0.7 MG/100ML-% IV SOLN
INTRAVENOUS | Status: AC
  Filled 2022-08-12: qty 100

## 2022-08-12 MED ORDER — MEPERIDINE HCL 25 MG/ML IJ SOLN: 6.2500 mg | INTRAMUSCULAR | Status: DC | PRN

## 2022-08-12 MED ORDER — FENTANYL CITRATE (PF) 100 MCG/2ML IJ SOLN
INTRAMUSCULAR | Status: AC
  Filled 2022-08-12: qty 2

## 2022-08-12 MED ORDER — METHOCARBAMOL 500 MG IVPB - SIMPLE MED: 500.0000 mg | Freq: Four times a day (QID) | INTRAVENOUS | Status: DC | PRN

## 2022-08-12 MED ORDER — OXYCODONE HCL 5 MG PO TABS
5.0000 mg | ORAL_TABLET | Freq: Once | ORAL | Status: AC | PRN
  Administered 2022-08-12: 5 mg via ORAL

## 2022-08-12 MED ORDER — CEFAZOLIN SODIUM-DEXTROSE 2-3 GM-%(50ML) IV SOLR
INTRAVENOUS | Status: DC | PRN
  Administered 2022-08-12: 2 g via INTRAVENOUS

## 2022-08-12 MED ORDER — 0.9 % SODIUM CHLORIDE (POUR BTL) OPTIME
TOPICAL | Status: DC | PRN
  Administered 2022-08-12: 1000 mL

## 2022-08-12 SURGICAL SUPPLY — 57 items
ACE SHELL 54 4H HIP (Shell) ×1 IMPLANT
ADH SKN CLS APL DERMABOND .7 (GAUZE/BANDAGES/DRESSINGS) ×2
APL PRP STRL LF DISP 70% ISPRP (MISCELLANEOUS) ×1
BAG COUNTER SPONGE SURGICOUNT (BAG) IMPLANT
BAG DECANTER FOR FLEXI CONT (MISCELLANEOUS) IMPLANT
BAG SPEC THK2 15X12 ZIP CLS (MISCELLANEOUS)
BAG SPNG CNTER NS LX DISP (BAG)
BAG ZIPLOCK 12X15 (MISCELLANEOUS) IMPLANT
CHLORAPREP W/TINT 26 (MISCELLANEOUS) ×1 IMPLANT
COVER PERINEAL POST (MISCELLANEOUS) ×1 IMPLANT
COVER SURGICAL LIGHT HANDLE (MISCELLANEOUS) ×1 IMPLANT
DERMABOND ADVANCED .7 DNX12 (GAUZE/BANDAGES/DRESSINGS) ×2 IMPLANT
DRAPE IMP U-DRAPE 54X76 (DRAPES) ×1 IMPLANT
DRAPE SHEET LG 3/4 BI-LAMINATE (DRAPES) ×3 IMPLANT
DRAPE STERI IOBAN 125X83 (DRAPES) ×1 IMPLANT
DRAPE U-SHAPE 47X51 STRL (DRAPES) ×2 IMPLANT
DRSG AQUACEL AG ADV 3.5X10 (GAUZE/BANDAGES/DRESSINGS) ×1 IMPLANT
ELECT REM PT RETURN 15FT ADLT (MISCELLANEOUS) ×1 IMPLANT
GAUZE SPONGE 4X4 12PLY STRL (GAUZE/BANDAGES/DRESSINGS) ×1 IMPLANT
GLOVE BIO SURGEON STRL SZ7 (GLOVE) ×1 IMPLANT
GLOVE BIO SURGEON STRL SZ8.5 (GLOVE) ×2 IMPLANT
GLOVE BIOGEL PI IND STRL 7.5 (GLOVE) ×1 IMPLANT
GLOVE BIOGEL PI IND STRL 8.5 (GLOVE) ×1 IMPLANT
GOWN SPEC L3 XXLG W/TWL (GOWN DISPOSABLE) ×1 IMPLANT
GOWN STRL REUS W/ TWL XL LVL3 (GOWN DISPOSABLE) ×1 IMPLANT
GOWN STRL REUS W/TWL XL LVL3 (GOWN DISPOSABLE) ×1
HANDPIECE INTERPULSE COAX TIP (DISPOSABLE) ×1
HEAD CERAMIC BIOLOX 36 T1 STD (Head) IMPLANT
HOLDER FOLEY CATH W/STRAP (MISCELLANEOUS) ×1 IMPLANT
HOOD PEEL AWAY T7 (MISCELLANEOUS) ×3 IMPLANT
KIT TURNOVER KIT A (KITS) IMPLANT
LINER ACETAB NN G7 F 36 (Liner) IMPLANT
MANIFOLD NEPTUNE II (INSTRUMENTS) ×1 IMPLANT
MARKER SKIN DUAL TIP RULER LAB (MISCELLANEOUS) ×1 IMPLANT
NDL SAFETY ECLIP 18X1.5 (MISCELLANEOUS) ×1 IMPLANT
NDL SPNL 18GX3.5 QUINCKE PK (NEEDLE) ×1 IMPLANT
NEEDLE SPNL 18GX3.5 QUINCKE PK (NEEDLE) ×1 IMPLANT
PACK ANTERIOR HIP CUSTOM (KITS) ×1 IMPLANT
PENCIL SMOKE EVACUATOR (MISCELLANEOUS) IMPLANT
SAW OSC TIP CART 19.5X105X1.3 (SAW) ×1 IMPLANT
SEALER BIPOLAR AQUA 6.0 (INSTRUMENTS) ×1 IMPLANT
SET HNDPC FAN SPRY TIP SCT (DISPOSABLE) ×1 IMPLANT
SHELL ACETAB 54 4H HIP (Shell) IMPLANT
SOLUTION PRONTOSAN WOUND 350ML (IRRIGATION / IRRIGATOR) ×1 IMPLANT
SPIKE FLUID TRANSFER (MISCELLANEOUS) ×1 IMPLANT
STEM FEM CMTLS 18X156 133D (Stem) IMPLANT
SUT MNCRL AB 3-0 PS2 18 (SUTURE) ×1 IMPLANT
SUT MON AB 2-0 CT1 36 (SUTURE) ×1 IMPLANT
SUT STRATAFIX PDO 1 14 VIOLET (SUTURE) ×1
SUT STRATFX PDO 1 14 VIOLET (SUTURE) ×1
SUT VIC AB 2-0 CT1 27 (SUTURE)
SUT VIC AB 2-0 CT1 TAPERPNT 27 (SUTURE) IMPLANT
SUTURE STRATFX PDO 1 14 VIOLET (SUTURE) ×1 IMPLANT
SYR 3ML LL SCALE MARK (SYRINGE) ×1 IMPLANT
TRAY FOLEY MTR SLVR 16FR STAT (SET/KITS/TRAYS/PACK) IMPLANT
TUBE SUCTION HIGH CAP CLEAR NV (SUCTIONS) ×1 IMPLANT
WATER STERILE IRR 1000ML POUR (IV SOLUTION) ×1 IMPLANT

## 2022-08-12 NOTE — Anesthesia Procedure Notes (Signed)
Procedure Name: Intubation Date/Time: 08/12/2022 7:57 AM  Performed by: Basilio Cairo, CRNAPre-anesthesia Checklist: Patient identified, Patient being monitored, Timeout performed, Emergency Drugs available and Suction available Patient Re-evaluated:Patient Re-evaluated prior to induction Oxygen Delivery Method: Circle system utilized Preoxygenation: Pre-oxygenation with 100% oxygen Induction Type: IV induction Ventilation: Mask ventilation without difficulty Laryngoscope Size: Mac and 3 Grade View: Grade I Tube type: Oral Tube size: 7.0 mm Number of attempts: 1 Airway Equipment and Method: Stylet Placement Confirmation: ETT inserted through vocal cords under direct vision, positive ETCO2 and breath sounds checked- equal and bilateral Secured at: 22 cm Tube secured with: Tape Dental Injury: Teeth and Oropharynx as per pre-operative assessment

## 2022-08-12 NOTE — Anesthesia Preprocedure Evaluation (Signed)
Anesthesia Evaluation  Patient identified by MRN, date of birth, ID band Patient awake    Reviewed: Allergy & Precautions, H&P , NPO status , Patient's Chart, lab work & pertinent test results  Airway Mallampati: II  TM Distance: >3 FB Neck ROM: Full    Dental no notable dental hx.    Pulmonary neg pulmonary ROS   Pulmonary exam normal breath sounds clear to auscultation       Cardiovascular hypertension, Normal cardiovascular exam Rhythm:Regular Rate:Normal     Neuro/Psych negative neurological ROS  negative psych ROS   GI/Hepatic negative GI ROS, Neg liver ROS,,,  Endo/Other    Morbid obesity  Renal/GU negative Renal ROS  negative genitourinary   Musculoskeletal negative musculoskeletal ROS (+)    Abdominal  (+) + obese  Peds negative pediatric ROS (+)  Hematology negative hematology ROS (+)   Anesthesia Other Findings   Reproductive/Obstetrics negative OB ROS                             Anesthesia Physical Anesthesia Plan  ASA: 3  Anesthesia Plan: General   Post-op Pain Management: Dilaudid IV   Induction: Intravenous  PONV Risk Score and Plan: 3 and Ondansetron, Dexamethasone, Midazolam and Treatment may vary due to age or medical condition  Airway Management Planned: Oral ETT  Additional Equipment:   Intra-op Plan:   Post-operative Plan: Extubation in OR  Informed Consent: I have reviewed the patients History and Physical, chart, labs and discussed the procedure including the risks, benefits and alternatives for the proposed anesthesia with the patient or authorized representative who has indicated his/her understanding and acceptance.     Dental advisory given  Plan Discussed with: CRNA  Anesthesia Plan Comments:        Anesthesia Quick Evaluation

## 2022-08-12 NOTE — Anesthesia Postprocedure Evaluation (Signed)
Anesthesia Post Note  Patient: Kimberly Lyons  Procedure(s) Performed: TOTAL HIP ARTHROPLASTY ANTERIOR APPROACH (Left: Hip)     Patient location during evaluation: PACU Anesthesia Type: General Level of consciousness: awake and alert Pain management: pain level controlled Vital Signs Assessment: post-procedure vital signs reviewed and stable Respiratory status: spontaneous breathing, nonlabored ventilation and respiratory function stable Cardiovascular status: blood pressure returned to baseline and stable Postop Assessment: no apparent nausea or vomiting Anesthetic complications: no   No notable events documented.  Last Vitals:  Vitals:   08/12/22 1100 08/12/22 1137  BP: 107/70 120/72  Pulse: (!) 58 60  Resp: 15   Temp:    SpO2: 96% 97%    Last Pain:  Vitals:   08/12/22 1137  TempSrc:   PainSc: 8                  Lowella Curb

## 2022-08-12 NOTE — Op Note (Signed)
OPERATIVE REPORT  SURGEON: Samson Frederic, MD   ASSISTANT: Clint Bolder, PA-C  PREOPERATIVE DIAGNOSIS: Displaced Left femoral neck fracture.   POSTOPERATIVE DIAGNOSIS: Displaced Left femoral neck fracture.   PROCEDURE: Left total hip arthroplasty, anterior approach.   IMPLANTS: Biomet Taperloc Reduced Distal stem, size 18x156 mm, high offset. Biomet G7 OsseoTi Cup, size 54 mm. Biomet Vivacit-E liner, size 36 mm, F, neutral. Biomet Biolox ceramic head ball, size 36 + 0 mm.  ANESTHESIA:  General  ANTIBIOTICS: 2g ancef.  ESTIMATED BLOOD LOSS:-175 mL    DRAINS: None.  COMPLICATIONS: None   CONDITION: PACU - hemodynamically stable.   BRIEF CLINICAL NOTE: Kimberly Lyons is a 62 y.o. female with a displaced Left femoral neck fracture. The patient was admitted to the hospitalist service and underwent perioperative risk stratification and medical optimization. The risks, benefits, and alternatives to total hip arthroplasty were explained, and the patient elected to proceed.  PROCEDURE IN DETAIL: The patient was taken to the operating room and general anesthesia was induced on the hospital bed.  The patient was then positioned on the Hana table.  All bony prominences were well padded.  The hip was prepped and draped in the normal sterile surgical fashion.  A time-out was called verifying side and site of surgery. Antibiotics were given within 60 minutes of beginning the procedure.   Bikini incision was made, and the direct anterior approach to the hip was performed through the Hueter interval.  Lateral femoral circumflex vessels were treated with the Auqumantys. The anterior capsule was exposed and an inverted T capsulotomy was made.  Fracture hematoma was encountered and evacuated. The patient was found to have a comminuted Left subcapital femoral neck fracture.  I freshened the femoral neck cut with a saw.  I removed the femoral neck fragment.  A corkscrew was placed into the head and the head  was removed.  This was passed to the back table and was measured. The pubofemoral ligament was released subperiosteally to the lesser trochanter.  Acetabular exposure was achieved, and the pulvinar and labrum were excised. Sequential reaming of the acetabulum was then performed up to a size 53 mm reamer under direct visulization. A 54 mm cup was then opened and impacted into place at approximately 40 degrees of abduction and 20 degrees of anteversion. The final polyethylene liner was impacted into place and acetabular osteophytes were removed.    I then gained femoral exposure taking care to protect the abductors and greater trochanter.  This was performed using standard external rotation, extension, and adduction.  A cookie cutter was used to enter the femoral canal, and then the femoral canal finder was placed.  Sequential broaching was performed up to a size 18.  Calcar planer was used on the femoral neck remnant.  I placed a high offset neck and a trial head ball.  The hip was reduced.  Leg lengths and offset were checked fluoroscopically.  The hip was dislocated and trial components were removed.  The final implants were placed, and the hip was reduced.  Fluoroscopy was used to confirm component position and leg lengths.  At 90 degrees of external rotation and full extension, the hip was stable to an anterior directed force.   The wound was copiously irrigated with Prontosan solution and normal saline using pulse lavage.  Marcaine solution was injected into the periarticular soft tissue.  The wound was closed in layers using #1 Stratafix for the fascia, 2-0 Vicryl for the subcutaneous fat, 2-0 Monocryl for  the deep dermal layer, and staples + Dermabond for the skin.  Once the glue was fully dried, an Aquacell Ag dressing was applied.  The patient was transported to the recovery room in stable condition.  Sponge, needle, and instrument counts were correct at the end of the case x2.  The patient tolerated  the procedure well and there were no known complications.  The aquamantis was utilized for this case to help facilitate better hemostasis as patient was felt to be at increased risk of bleeding because of obesity.  A oscillating saw tip was utilized for this case to prevent damage to the soft tissue structures such as muscles, ligaments and tendons, and to ensure accurate bone cuts. This patient was at increased risk for above structures due to  minimally invasive approach.  Please note that a surgical assistant was a medical necessity for this procedure to perform it in a safe and expeditious manner. Assistant was necessary to provide appropriate retraction of vital neurovascular structures, to prevent femoral fracture, and to allow for anatomic placement of the prosthesis.

## 2022-08-12 NOTE — Transfer of Care (Signed)
Immediate Anesthesia Transfer of Care Note  Patient: Kimberly Lyons  Procedure(s) Performed: Procedure(s): TOTAL HIP ARTHROPLASTY ANTERIOR APPROACH (Left)  Patient Location: PACU  Anesthesia Type:General  Level of Consciousness: Alert, Awake, Oriented  Airway & Oxygen Therapy: Patient Spontanous Breathing  Post-op Assessment: Report given to RN  Post vital signs: Reviewed and stable  Last Vitals:  Vitals:   08/12/22 0630 08/12/22 0631  BP: 128/64   Pulse:  64  Resp: 15 16  Temp:    SpO2:  96%    Complications: No apparent anesthesia complications

## 2022-08-12 NOTE — Progress Notes (Signed)
[facility] PROGRESS NOTE   MARYANNE DIVITO IHK:[id number] DOB: [birth year] DOA: 08/10/2022  PCP: Karie Georges, MD  Brief History/Interval Summary: 62 y.o. female with medical history significant for systolic heart failure on Entresto, non-insulin-dependent diabetes recently started on metformin, morbid obesity admitted to the hospital with left hip fracture after mechanical fall.    Consultants: Orthopedics  Procedures: None yet    Subjective/Interval History: Patient seen after she returned from the OR.  Surgery went well.  Denies any pain currently.  No shortness of breath.   Assessment/Plan:  Left hip fracture Secondary to mechanical fall.  She does have a history of CHF but it seems to be well-controlled.  Last echocardiogram from 2022 showed normal systolic function.   Patient seen by orthopedics.  Underwent surgery this morning.  Seems to have tolerated it well. Pain control.  PT and OT evaluation.  Chronic systolic CHF/hypotension Followed by Dr. Antoine Poche. She used to have left ventricular EF of 30 to 35% back in 2020.  She was on carvedilol and Entresto.  Last echo from 2022 showed improvement in left ventricular EF to 60 to 65%. She was noted to be hypotensive.  Her Entresto and carvedilol were held.  Blood pressures have stabilized and improved.  Will plan to resume these medications in the next 24 to 48 hours depending on her blood pressure trends. Otherwise she seems to be well compensated.  Acute kidney injury Baseline renal function is normal.  Aldactone placed on hold.  Entresto also on hold.  Recheck labs tomorrow.  Monitor urine output.  Avoid nephrotoxic agents.    Obesity Estimated body mass index is 42.28 kg/m as calculated from the following:   Height as of this encounter: 5' 5.75" (1.67 m).   Weight as of this encounter: 117.9 kg.   DVT Prophylaxis: SCDs for now.  Definitive prophylaxis can be initiated 24 hrs after surgery per orthopedic  input. Code Status: Full code Family Communication: Discussed with patient Disposition Plan: To be determined.  Patient hoping to go home but also open to considering skilled nursing facility for rehab depending on how she does with physical therapy.  Status is: Inpatient Remains inpatient appropriate because: Hip fracture      Medications: Scheduled:  aspirin  81 mg Oral BID   docusate sodium  100 mg Oral BID   HYDROmorphone       oxyCODONE       senna  1 tablet Oral BID   Continuous:  sodium chloride     ceFAZolin      ceFAZolin (ANCEF) IV 2 g (08/12/22 1351)   methocarbamol (ROBAXIN) IV     VFI:EPPIRJJOA, alum & mag hydroxide-simeth, ceFAZolin, diphenhydrAMINE, HYDROmorphone, HYDROmorphone (DILAUDID) injection, menthol-cetylpyridinium **OR** phenol, methocarbamol **OR** methocarbamol (ROBAXIN) IV, metoCLOPramide **OR** metoCLOPramide (REGLAN) injection, ondansetron **OR** ondansetron (ZOFRAN) IV, oxyCODONE, oxyCODONE, polyethylene glycol  Antibiotics: Anti-infectives (From admission, onward)    Start     Dose/Rate Route Frequency Ordered Stop   08/12/22 1400  ceFAZolin (ANCEF) IVPB 2g/100 mL premix        2 g 200 mL/hr over 30 Minutes Intravenous Every 6 hours 08/12/22 1125 08/13/22 0159   08/12/22 0813  ceFAZolin (ANCEF) 2-4 GM/100ML-% IVPB       Note to Pharmacy: Mortimer Fries A: cabinet override      08/12/22 0813 08/12/22 2029       Objective:  Vital Signs  Vitals:   08/12/22 1045 08/12/22 1100 08/12/22 1137 08/12/22 1318  BP: 115/73 107/70  120/72 130/70  Pulse: 60 (!) 58 60 77  Resp: Temp:    97.7 F (36.5 C)  TempSrc:    Oral  SpO2: 100% 96% 97% 97%  Weight:      Height:        Intake/Output Summary (Last 24 hours) at 08/12/2022 1411 Last data filed at 08/12/2022 1320 Gross per 24 hour  Intake 1450 ml  Output 775 ml  Net 675 ml   Filed Weights   08/10/22 1050  Weight: 117.9 kg   General appearance: Awake alert.  In no  distress Resp: Clear to auscultation bilaterally.  Normal effort Cardio: S1-S2 is normal regular.  No S3-S4.  No rubs murmurs or bruit GI: Abdomen is soft.  Nontender nondistended.  Bowel sounds are present normal.  No masses organomegaly   Lab Results:  Data Reviewed: I have personally reviewed following labs and reports of the imaging studies  CBC: Recent Labs  Lab 08/10/22 1046 08/10/22 1437 08/11/22 0406  WBC 12.1* 12.0* 9.1  NEUTROABS 9.7*  --   --   HGB 12.8 12.0 12.1  HCT 39.0 37.1 37.5  MCV 92.6 94.4 95.9  PLT 268 246 232    Basic Metabolic Panel: Recent Labs  Lab 08/10/22 1046 08/10/22 1437 08/11/22 0406  NA 138  --  141  K 4.5  --  4.4  CL 109  --  109  CO2 21*  --  23  GLUCOSE 144*  --  138*  BUN 31*  --  27*  CREATININE 1.24* 1.05* 1.14*  CALCIUM 9.2  --  9.0    GFR: Estimated Creatinine Clearance: 64.9 mL/min (A) (by C-G formula based on SCr of 1.14 mg/dL (H)).  Coagulation Profile: Recent Labs  Lab 08/10/22 1046  INR 1.2     Radiology Studies: DG Pelvis Portable  Result Date: 08/12/2022 CLINICAL DATA:  Postop. EXAM: PORTABLE PELVIS 1-2 VIEWS COMPARISON:  Preoperative imaging FINDINGS: Left hip arthroplasty in expected alignment. No periprosthetic lucency or fracture. Recent postsurgical change includes air and edema in the soft tissues. Lateral skin staples are partially included in the field of view. IMPRESSION: Left hip arthroplasty without immediate postoperative complication. Electronically Signed   By: Narda Rutherford M.D.   On: 08/12/2022 10:33   DG HIP UNILAT WITH PELVIS 1V LEFT  Result Date: 08/12/2022 CLINICAL DATA:  Elective surgery. EXAM: DG HIP (WITH OR WITHOUT PELVIS) 1V*L* COMPARISON:  Preoperative imaging. FINDINGS: Four fluoroscopic spot views of the pelvis and left hip obtained in the operating room. Interval left hip arthroplasty. Fluoroscopy time 17 seconds. Dose 3.1 mGy. IMPRESSION: Intraoperative fluoroscopy during left hip  arthroplasty. Electronically Signed   By: Narda Rutherford M.D.   On: 08/12/2022 10:16   DG C-Arm 1-60 Min-No Report  Result Date: 08/12/2022 Fluoroscopy was utilized by the requesting physician.  No radiographic interpretation.   DG C-Arm 1-60 Min-No Report  Result Date: 08/12/2022 Fluoroscopy was utilized by the requesting physician.  No radiographic interpretation.       LOS: 2 days   Bobbye Reinitz Rito Ehrlich  [facility] Pager on [email or web address]  08/12/2022, 2:11 PM

## 2022-08-12 NOTE — Interval H&P Note (Signed)
History and Physical Interval Note:  08/12/2022 7:39 AM  [name]  has presented today for surgery, with the diagnosis of LEFT FEMORAL NECK FRACTURE.  The various methods of treatment have been discussed with the patient and family. After consideration of risks, benefits and other options for treatment, the patient has consented to  Procedure(s): TOTAL HIP ARTHROPLASTY ANTERIOR APPROACH (Left) as a surgical intervention.  The patient's history has been reviewed, patient examined, no change in status, stable for surgery.  I have reviewed the patient's chart and labs.  Questions were answered to the patient's satisfaction.    The risks, benefits, and alternatives were discussed with the patient. There are risks associated with the surgery including, but not limited to, problems with anesthesia (death), infection, instability (giving out of the joint), dislocation, differences in leg length/angulation/rotation, fracture of bones, loosening or failure of implants, hematoma (blood accumulation) which may require surgical drainage, blood clots, pulmonary embolism, nerve injury (foot drop and lateral thigh numbness), and blood vessel injury. The patient understands these risks and elects to proceed.    Iline Oven Braxten Memmer

## 2022-08-12 NOTE — Evaluation (Signed)
Physical Therapy Evaluation Patient Details Name: Kimberly Lyons MRN: [id number] DOB: Feb 04, 1961 Today's Date: 08/12/2022  History of Present Illness  Pt s/p fall with L hip fx and now s/p L THR.  Pt with hx of DM, systolic heart failure, and morbid obesity  Clinical Impression  Pt admitted as above and presenting with functional mobility limitations 2* decreased L LE strength/ROM and post op pain.  Pt should progress to dc home with assist of family/friends.     Recommendations for follow up therapy are one component of a multi-disciplinary discharge planning process, led by the attending physician.  Recommendations may be updated based on patient status, additional functional criteria and insurance authorization.  Follow Up Recommendations       Assistance Recommended at Discharge Intermittent Supervision/Assistance  Patient can return home with the following  A little help with walking and/or transfers;A little help with bathing/dressing/bathroom;Assistance with cooking/housework;Assist for transportation;Help with stairs or ramp for entrance    Equipment Recommendations Rolling walker (2 wheels)  Recommendations for Other Services  OT consult    Functional Status Assessment Patient has had a recent decline in their functional status and demonstrates the ability to make significant improvements in function in a reasonable and predictable amount of time.     Precautions / Restrictions Precautions Precautions: Fall Restrictions Weight Bearing Restrictions: No LLE Weight Bearing: Weight bearing as tolerated      Mobility  Bed Mobility Overal bed mobility: Needs Assistance Bed Mobility: Supine to Sit     Supine to sit: Min assist, Mod assist     General bed mobility comments: cues for LE management and use of UEs to self assist    Transfers Overall transfer level: Needs assistance Equipment used: Rolling walker (2 wheels) Transfers: Sit to/from Stand Sit to Stand:  Min assist           General transfer comment: cues for LE management and use of UEs to self assist    Ambulation/Gait Ambulation/Gait assistance: Min assist Gait Distance (Feet): 50 Feet Assistive device: Rolling walker (2 wheels) Gait Pattern/deviations: Step-to pattern, Shuffle, Decreased step length - right, Decreased step length - left, Trunk flexed Gait velocity: decr     General Gait Details: Cues for sequence, posture and position from [employer]            Wheelchair Mobility    Modified Rankin (Stroke Patients Only)       Balance Overall balance assessment: Needs assistance Sitting-balance support: No upper extremity supported, Feet supported Sitting balance-Leahy Scale: Good     Standing balance support: Bilateral upper extremity supported Standing balance-Leahy Scale: Poor                               Pertinent Vitals/Pain Pain Assessment Pain Assessment: 0-10 Pain Score: 4  Pain Location: L hip Pain Descriptors / Indicators: Aching, Sore Pain Intervention(s): Limited activity within patient's tolerance, Monitored during session, Premedicated before session, Ice applied    Home Living Family/patient expects to be discharged to:: Private residence Living Arrangements: Alone Available Help at Discharge: Friend(s);Available 24 hours/day;Family Type of Home: House Home Access: Stairs to enter Entrance Stairs-Rails: Right;Left Entrance Stairs-Number of Steps: 4 Alternate Level Stairs-Number of Steps: 14 Home Layout: Two level;Able to live on main level with bedroom/bathroom Home Equipment: Rollator (4 wheels);Crutches      Prior Function Prior Level of Function : Independent/Modified Independent  Mobility Comments: Prior to fall fully IND; s/p fall using rollator       Hand Dominance        Extremity/Trunk Assessment   Upper Extremity Assessment Upper Extremity Assessment: Overall WFL for tasks  assessed    Lower Extremity Assessment Lower Extremity Assessment: LLE deficits/detail    Cervical / Trunk Assessment Cervical / Trunk Assessment: Normal  Communication   Communication: No difficulties  Cognition Arousal/Alertness: Awake/alert Behavior During Therapy: WFL for tasks assessed/performed Overall Cognitive Status: Within Functional Limits for tasks assessed                                          General Comments      Exercises Total Joint Exercises Ankle Circles/Pumps: AROM, Both, 15 reps, Supine   Assessment/Plan    PT Assessment Patient needs continued PT services  PT Problem List Decreased strength;Decreased range of motion;Decreased activity tolerance;Decreased balance;Decreased mobility;Decreased knowledge of use of DME;Obesity;Pain       PT Treatment Interventions DME instruction;Gait training;Stair training;Functional mobility training;Therapeutic activities;Therapeutic exercise;Patient/family education    PT Goals (Current goals can be found in the Care Plan section)  Acute Rehab PT Goals Patient Stated Goal: Regain IND PT Goal Formulation: With patient Time For Goal Achievement: 08/19/22 Potential to Achieve Goals: Good    Frequency 7X/week     Co-evaluation               AM-PAC PT "6 Clicks" Mobility  Outcome Measure Help needed turning from your back to your side while in a flat bed without using bedrails?: A Lot Help needed moving from lying on your back to sitting on the side of a flat bed without using bedrails?: A Little Help needed moving to and from a bed to a chair (including a wheelchair)?: A Little Help needed standing up from a chair using your arms (e.g., wheelchair or bedside chair)?: A Little Help needed to walk in hospital room?: A Little Help needed climbing 3-5 steps with a railing? : A Lot 6 Click Score: 16    End of Session Equipment Utilized During Treatment: Gait belt Activity Tolerance:  Patient tolerated treatment well Patient left: with call bell/phone within reach;in chair;with chair alarm set;with family/visitor present Nurse Communication: Mobility status PT Visit Diagnosis: Difficulty in walking, not elsewhere classified (R26.2)    Time: 9604-5409 PT Time Calculation (min) (ACUTE ONLY): 26 min   Charges:   PT Evaluation $PT Eval Low Complexity: 1 Low PT Treatments $Gait Training: 8-22 mins        Mauro Kaufmann PT Acute Rehabilitation Services Pager [phone] Office [phone]   Kimberly Lyons 08/12/2022, 4:45 PM

## 2022-08-13 ENCOUNTER — Encounter (HOSPITAL_COMMUNITY): Payer: Self-pay | Admitting: Anesthesiology

## 2022-08-13 DIAGNOSIS — I5022 Chronic systolic (congestive) heart failure: Secondary | ICD-10-CM | POA: Diagnosis not present

## 2022-08-13 DIAGNOSIS — I959 Hypotension, unspecified: Secondary | ICD-10-CM | POA: Diagnosis not present

## 2022-08-13 LAB — CBC
HCT: 33.8 % — ABNORMAL LOW (ref 36.0–46.0)
Hemoglobin: 11 g/dL — ABNORMAL LOW (ref 12.0–15.0)
MCH: 31.2 pg (ref 26.0–34.0)
MCHC: 32.5 g/dL (ref 30.0–36.0)
MCV: 95.8 fL (ref 80.0–100.0)
Platelets: 216 10*3/uL (ref 150–400)
RBC: 3.53 MIL/uL — ABNORMAL LOW (ref 3.87–5.11)
RDW: 13.8 % (ref 11.5–15.5)
WBC: 12.1 10*3/uL — ABNORMAL HIGH (ref 4.0–10.5)
nRBC: 0 % (ref 0.0–0.2)

## 2022-08-13 LAB — BASIC METABOLIC PANEL
Anion gap: 7 (ref 5–15)
BUN: 24 mg/dL — ABNORMAL HIGH (ref 8–23)
CO2: 23 mmol/L (ref 22–32)
Calcium: 8.3 mg/dL — ABNORMAL LOW (ref 8.9–10.3)
Chloride: 110 mmol/L (ref 98–111)
Creatinine, Ser: 0.96 mg/dL (ref 0.44–1.00)
GFR, Estimated: >60 mL/min (ref >60)
Glucose, Bld: 172 mg/dL — ABNORMAL HIGH (ref 70–99)
Potassium: 4.3 mmol/L (ref 3.5–5.1)
Sodium: 140 mmol/L (ref 135–145)

## 2022-08-13 MED ORDER — CARVEDILOL 6.25 MG PO TABS
6.2500 mg | ORAL_TABLET | Freq: Two times a day (BID) | ORAL | Status: DC
  Administered 2022-08-13 – 2022-08-14 (×2): 6.25 mg via ORAL
  Filled 2022-08-13 (×2): qty 1

## 2022-08-13 NOTE — TOC Transition Note (Signed)
Transition of Care [facility]) - CM/SW Discharge Note   Patient Details  Name: Kimberly Lyons MRN: [id number] Date of Birth: 1960/06/27  [facility]) CM/SW Contact:  Adrian Prows, RN Phone Number: 08/13/2022, 2:01 PM   Clinical Narrative:    Orders received for RW and 3-n-1; pt agrees to receiving DME; spoke w/ Vaughan Basta at [facility]; he says agency can provide services, and DME will be delivered to pt's room; pt notified; no TOC needs.   Final next level of care: Home/Self Care Barriers to Discharge: No Barriers Identified   Patient Goals and CMS Choice      Discharge Placement                         Discharge Plan and Services Additional resources added to the After Visit Summary for   In-house Referral: Clinical Social Work              DME Arranged: 3-N-1, Dan Humphreys rolling DME Agency: [employer] Date DME Agency Contacted: 08/13/22 Time DME Agency Contacted: 1400 Representative spoke with at DME Agency: Vaughan Basta            Social Determinants of Health (SDOH) Interventions SDOH Screenings   Food Insecurity: No Food Insecurity (08/10/2022)  Housing: Low Risk  (08/10/2022)  Transportation Needs: No Transportation Needs (08/10/2022)  Utilities: Not At Risk (08/10/2022)  Depression (PHQ2-9): Low Risk  (06/23/2022)  Tobacco Use: Low Risk  (08/12/2022)     Readmission Risk Interventions    08/11/2022    2:27 PM  Readmission Risk Prevention Plan  Post Dischage Appt Complete  Medication Screening Complete  Transportation Screening Complete

## 2022-08-13 NOTE — Progress Notes (Signed)
[facility] PROGRESS NOTE   Kimberly Lyons HQR:[id number] DOB: February 08, 1961 DOA: 08/10/2022  PCP: Kimberly Georges, MD  Brief History/Interval Summary: 62 y.o. female with medical history significant for systolic heart failure on Entresto, non-insulin-dependent diabetes recently started on metformin, morbid obesity admitted to the hospital with left hip fracture after mechanical fall.    Consultants: Orthopedics  Procedures: 4/13: Left total hip arthroplasty, anterior approach.     Subjective/Interval History: Patient mentions that she is doing well this morning.  Not having significant pain.  Was able to ambulate with physical therapy yesterday.  Denies any chest pain or shortness of breath.  No nausea or vomiting.   Assessment/Plan:  Left hip fracture Secondary to mechanical fall.  Seen by orthopedics and underwent left total hip arthroplasty on 4/13.  Seems to be stable postoperatively.  Pain is well-controlled.  PT and OT evaluation.  Further management per orthopedics.  Patient prefers to go home if possible rather than going to skilled nursing facility.  We will see how she does over the next 24 hours.    Chronic systolic CHF/hypotension Followed by Dr. Antoine Lyons. She used to have left ventricular EF of 30 to 35% back in 2020.  She was on carvedilol and Entresto.  Last echo from 2022 showed improvement in left ventricular EF to 60 to 65%. She was hypotensive during initial part of this admission.  Her carvedilol and Entresto were held.  Blood pressure stabilized.  She tolerated her surgery well yesterday.  Blood pressure noted to be soft this morning.  Heart rate in the 70s.  Will continue to monitor trends and then decide on resumption of her cardiac medications in the next 24 hours.  May need to start carvedilol at a lower dose. Otherwise she seems to be well compensated.  Acute kidney injury Baseline renal function is normal.  Aldactone placed on hold.  Entresto also on  hold.   Renal function is back to baseline now.  Avoid nephrotoxic agents.   Should be able to resume Aldactone at discharge.  Obesity Estimated body mass index is 42.28 kg/m as calculated from the following:   Height as of this encounter: 5' 5.75" (1.67 m).   Weight as of this encounter: 117.9 kg.   DVT Prophylaxis: On aspirin twice a day Code Status: Full code Family Communication: Discussed with patient Disposition Plan: To be determined.  Patient hoping to go home but also open to considering skilled nursing facility for rehab depending on how she does with physical therapy.  Will determine tomorrow based on how she does and based on orthopedic input.  Status is: Inpatient Remains inpatient appropriate because: Hip fracture      Medications: Scheduled:  aspirin  81 mg Oral BID   docusate sodium  100 mg Oral BID   senna  1 tablet Oral BID   Continuous:  sodium chloride Stopped (08/13/22 0446)   methocarbamol (ROBAXIN) IV     DIY:MEBRAXENM, alum & mag hydroxide-simeth, diphenhydrAMINE, HYDROmorphone (DILAUDID) injection, menthol-cetylpyridinium **OR** phenol, methocarbamol **OR** methocarbamol (ROBAXIN) IV, metoCLOPramide **OR** metoCLOPramide (REGLAN) injection, ondansetron **OR** ondansetron (ZOFRAN) IV, oxyCODONE, polyethylene glycol  Antibiotics: Anti-infectives (From admission, onward)    Start     Dose/Rate Route Frequency Ordered Stop   08/12/22 1400  ceFAZolin (ANCEF) IVPB 2g/100 mL premix        2 g 200 mL/hr over 30 Minutes Intravenous Every 6 hours 08/12/22 1125 08/12/22 2142   08/12/22 0813  ceFAZolin (ANCEF) 2-4 GM/100ML-% IVPB  Note to Pharmacy: Kimberly Lyons A: cabinet override      08/12/22 0813 08/12/22 1614       Objective:  Vital Signs  Vitals:   08/12/22 1819 08/12/22 2138 08/13/22 0143 08/13/22 0557  BP: 110/65 (!) 108/52 104/70 (!) 105/58  Pulse: 79 72 75 73  Resp: Temp: 98.3 F (36.8 C) 98.4 F (36.9 C) 98.3 F  (36.8 C) 98.5 F (36.9 C)  TempSrc: Oral Oral Oral Oral  SpO2: 98% 98% 97% 95%  Weight:      Height:        Intake/Output Summary (Last 24 hours) at 08/13/2022 0901 Last data filed at 08/13/2022 0600 Gross per 24 hour  Intake 4551.17 ml  Output 775 ml  Net 3776.17 ml    Filed Weights   08/10/22 1050  Weight: 117.9 kg    General appearance: Awake alert.  In no distress Resp: Clear to auscultation bilaterally.  Normal effort Cardio: S1-S2 is normal regular.  No S3-S4.  No rubs murmurs or bruit GI: Abdomen is soft.  Nontender nondistended.  Bowel sounds are present normal.  No masses organomegaly No focal neurological deficits.  Lab Results:  Data Reviewed: I have personally reviewed following labs and reports of the imaging studies  CBC: Recent Labs  Lab 08/10/22 1046 08/10/22 1437 08/11/22 0406 08/13/22 0326  WBC 12.1* 12.0* 9.1 12.1*  NEUTROABS 9.7*  --   --   --   HGB 12.8 12.0 12.1 11.0*  HCT 39.0 37.1 37.5 33.8*  MCV 92.6 94.4 95.9 95.8  PLT 268 246 232 216     Basic Metabolic Panel: Recent Labs  Lab 08/10/22 1046 08/10/22 1437 08/11/22 0406 08/13/22 0326  NA 138  --  141 140  K 4.5  --  4.4 4.3  CL 109  --  109 110  CO2 21*  --  23 23  GLUCOSE 144*  --  138* 172*  BUN 31*  --  27* 24*  CREATININE 1.24* 1.05* 1.14* 0.96  CALCIUM 9.2  --  9.0 8.3*     GFR: Estimated Creatinine Clearance: 77 mL/min (by C-G formula based on SCr of 0.96 mg/dL).  Coagulation Profile: Recent Labs  Lab 08/10/22 1046  INR 1.2      Radiology Studies: DG Pelvis Portable  Result Date: 08/12/2022 CLINICAL DATA:  Postop. EXAM: PORTABLE PELVIS 1-2 VIEWS COMPARISON:  Preoperative imaging FINDINGS: Left hip arthroplasty in expected alignment. No periprosthetic lucency or fracture. Recent postsurgical change includes air and edema in the soft tissues. Lateral skin staples are partially included in the field of view. IMPRESSION: Left hip arthroplasty without immediate  postoperative complication. Electronically Signed   By: Kimberly Lyons M.D.   On: 08/12/2022 10:33   DG HIP UNILAT WITH PELVIS 1V LEFT  Result Date: 08/12/2022 CLINICAL DATA:  Elective surgery. EXAM: DG HIP (WITH OR WITHOUT PELVIS) 1V*L* COMPARISON:  Preoperative imaging. FINDINGS: Four fluoroscopic spot views of the pelvis and left hip obtained in the operating room. Interval left hip arthroplasty. Fluoroscopy time 17 seconds. Dose 3.1 mGy. IMPRESSION: Intraoperative fluoroscopy during left hip arthroplasty. Electronically Signed   By: Kimberly Lyons M.D.   On: 08/12/2022 10:16   DG C-Arm 1-60 Min-No Report  Result Date: 08/12/2022 Fluoroscopy was utilized by the requesting physician.  No radiographic interpretation.   DG C-Arm 1-60 Min-No Report  Result Date: 08/12/2022 Fluoroscopy was utilized by the requesting physician.  No radiographic interpretation.  LOS: 3 days   Ethelyn Cerniglia [employer] on [email or web address]  08/13/2022, 9:01 AM

## 2022-08-13 NOTE — Progress Notes (Signed)
Physical Therapy Treatment Patient Details Name: Kimberly Lyons MRN: [id number] DOB: [birth year] Today's Date: 08/13/2022   History of Present Illness Pt s/p fall with L hip fx and now s/p L THR.  Pt with hx of DM, systolic heart failure, and morbid obesity    PT Comments    Pt continues motivated and progressing well with mobility.  Pt up to ambulate in hall, negotiated stairs, reviewed written HEP, reviewed car transfers and with questions asked and answered.  Pt caregivers present and pt hopeful for dc home this date.   Recommendations for follow up therapy are one component of a multi-disciplinary discharge planning process, led by the attending physician.  Recommendations may be updated based on patient status, additional functional criteria and insurance authorization.  Follow Up Recommendations       Assistance Recommended at Discharge Intermittent Supervision/Assistance  Patient can return home with the following A little help with walking and/or transfers;A little help with bathing/dressing/bathroom;Assistance with cooking/housework;Assist for transportation;Help with stairs or ramp for entrance   Equipment Recommendations  Rolling walker (2 wheels)    Recommendations for Other Services OT consult     Precautions / Restrictions Precautions Precautions: Fall Restrictions Weight Bearing Restrictions: No LLE Weight Bearing: Weight bearing as tolerated     Mobility  Bed Mobility Overal bed mobility: Needs Assistance Bed Mobility: Supine to Sit     Supine to sit: Min guard     General bed mobility comments: cues for LE management and use of UEs to self assist    Transfers Overall transfer level: Needs assistance Equipment used: Rolling walker (2 wheels) Transfers: Sit to/from Stand Sit to Stand: Min guard           General transfer comment: cues for LE management and use of UEs to self assist    Ambulation/Gait Ambulation/Gait assistance: Min guard Gait  Distance (Feet): 48 Feet Assistive device: Rolling walker (2 wheels) Gait Pattern/deviations: Step-to pattern, Shuffle, Decreased step length - right, Decreased step length - left, Trunk flexed Gait velocity: decr     General Gait Details: Cues for sequence, posture and position from RW   Stairs Stairs: Yes Stairs assistance: Min assist Stair Management: One rail Left, Step to pattern, Forwards, With cane Number of Stairs: 3 General stair comments: Cues for sequence   Wheelchair Mobility    Modified Rankin (Stroke Patients Only)       Balance Overall balance assessment: Needs assistance Sitting-balance support: No upper extremity supported, Feet supported Sitting balance-Leahy Scale: Good     Standing balance support: Single extremity supported Standing balance-Leahy Scale: Fair                              Cognition Arousal/Alertness: Awake/alert Behavior During Therapy: WFL for tasks assessed/performed Overall Cognitive Status: Within Functional Limits for tasks assessed                                          Exercises Total Joint Exercises Ankle Circles/Pumps: AROM, Both, 15 reps, Supine Quad Sets: AROM, Both, 10 reps, Supine Heel Slides: AAROM, Left, 20 reps, Supine Hip ABduction/ADduction: AAROM, Left, 15 reps, Supine Long Arc Quad: AROM, Left, 10 reps, Seated    General Comments        Pertinent Vitals/Pain Pain Assessment Pain Assessment: 0-10 Pain Score: 5  Pain Location: L  hip Pain Descriptors / Indicators: Aching, Sore Pain Intervention(s): Limited activity within patient's tolerance, Monitored during session, Premedicated before session    Home Living                          Prior Function            PT Goals (current goals can now be found in the care plan section) Acute Rehab PT Goals Patient Stated Goal: Regain IND PT Goal Formulation: With patient Time For Goal Achievement:  08/19/22 Potential to Achieve Goals: Good Progress towards PT goals: Progressing toward goals    Frequency    7X/week      PT Plan Discharge plan needs to be updated    Co-evaluation              AM-PAC PT "6 Clicks" Mobility   Outcome Measure  Help needed turning from your back to your side while in a flat bed without using bedrails?: A Little Help needed moving from lying on your back to sitting on the side of a flat bed without using bedrails?: A Little Help needed moving to and from a bed to a chair (including a wheelchair)?: A Little Help needed standing up from a chair using your arms (e.g., wheelchair or bedside chair)?: A Little Help needed to walk in hospital room?: A Little Help needed climbing 3-5 steps with a railing? : A Little 6 Click Score: 18    End of Session Equipment Utilized During Treatment: Gait belt Activity Tolerance: Patient tolerated treatment well Patient left: with call bell/phone within reach;in chair;with chair alarm set;with family/visitor present Nurse Communication: Mobility status PT Visit Diagnosis: Difficulty in walking, not elsewhere classified (R26.2)     Time: 1331-1400 PT Time Calculation (min) (ACUTE ONLY): 29 min  Charges:  $Gait Training: 8-22 mins $Therapeutic Exercise: 8-22 mins                     Kimberly Lyons PT Acute Rehabilitation Services Pager [phone] Office [phone]    Kimberly Lyons 08/13/2022, 3:09 PM

## 2022-08-13 NOTE — Progress Notes (Signed)
Subjective: 1 Day Post-Op Procedure(s) (LRB): TOTAL HIP ARTHROPLASTY ANTERIOR APPROACH (Left)  Patient reports pain as appropriately controlled. Denies any new numbness/tingling.   Objective:   VITALS:  Temp:  [97.7 F (36.5 C)-98.6 F (37 C)] 98.5 F (36.9 C) (04/14 0557) Pulse Rate:  [58-80] 73 (04/14 0557) Resp:  [14-21] 16 (04/14 0557) BP: (93-130)/(52-73) 105/58 (04/14 0557) SpO2:  [94 %-100 %] 95 % (04/14 0557)  Neurovascular intact Sensation intact distally Intact pulses distally Dorsiflexion/Plantar flexion intact Incision: dressing C/D/I Compartment soft   LABS Recent Labs    08/10/22 1046 08/10/22 1437 08/11/22 0406 08/13/22 0326  HGB 12.8 12.0 12.1 11.0*  WBC 12.1* 12.0* 9.1 12.1*  PLT 268 246 232 216   Recent Labs    08/11/22 0406 08/13/22 0326  NA 141 140  K 4.4 4.3  CL 109 110  CO2 23 23  BUN 27* 24*  CREATININE 1.14* 0.96  GLUCOSE 138* 172*   Recent Labs    08/10/22 1046  INR 1.2     Assessment/Plan: 1 Day Post-Op Procedure(s) (LRB): TOTAL HIP ARTHROPLASTY ANTERIOR APPROACH (Left)  Advance diet Up with therapy Appreciate primary team care  Netta Cedars 08/13/2022, 8:26 AM

## 2022-08-13 NOTE — Progress Notes (Signed)
Physical Therapy Treatment Patient Details Name: Kimberly Lyons MRN: [id number] DOB: [birth year] Today's Date: 08/13/2022   History of Present Illness Pt s/p fall with L hip fx and now s/p L THR.  Pt with hx of DM, systolic heart failure, and morbid obesity    PT Comments    Pt motivated and progressing well with mobility.  Pt up to ambulate increased distance in hall, requiring decreased assist for bed mobility and HEP initiated.    Recommendations for follow up therapy are one component of a multi-disciplinary discharge planning process, led by the attending physician.  Recommendations may be updated based on patient status, additional functional criteria and insurance authorization.  Follow Up Recommendations       Assistance Recommended at Discharge Intermittent Supervision/Assistance  Patient can return home with the following A little help with walking and/or transfers;A little help with bathing/dressing/bathroom;Assistance with cooking/housework;Assist for transportation;Help with stairs or ramp for entrance   Equipment Recommendations  Rolling walker (2 wheels)    Recommendations for Other Services OT consult     Precautions / Restrictions Precautions Precautions: Fall Restrictions Weight Bearing Restrictions: No LLE Weight Bearing: Weight bearing as tolerated     Mobility  Bed Mobility Overal bed mobility: Needs Assistance Bed Mobility: Supine to Sit     Supine to sit: Min assist     General bed mobility comments: cues for LE management and use of UEs to self assist    Transfers Overall transfer level: Needs assistance Equipment used: Rolling walker (2 wheels) Transfers: Sit to/from Stand Sit to Stand: Min assist, Min guard           General transfer comment: cues for LE management and use of UEs to self assist    Ambulation/Gait Ambulation/Gait assistance: Min assist, Min guard Gait Distance (Feet): 110 Feet Assistive device: Rolling walker (2  wheels) Gait Pattern/deviations: Step-to pattern, Shuffle, Decreased step length - right, Decreased step length - left, Trunk flexed Gait velocity: decr     General Gait Details: Cues for sequence, posture and position from [employer]             Wheelchair Mobility    Modified Rankin (Stroke Patients Only)       Balance Overall balance assessment: Needs assistance Sitting-balance support: No upper extremity supported, Feet supported Sitting balance-Leahy Scale: Good     Standing balance support: Single extremity supported Standing balance-Leahy Scale: Fair                              Cognition Arousal/Alertness: Awake/alert Behavior During Therapy: WFL for tasks assessed/performed Overall Cognitive Status: Within Functional Limits for tasks assessed                                          Exercises Total Joint Exercises Ankle Circles/Pumps: AROM, Both, 15 reps, Supine Quad Sets: AROM, Both, 10 reps, Supine Heel Slides: AAROM, Left, 20 reps, Supine Hip ABduction/ADduction: AAROM, Left, 15 reps, Supine Long Arc Quad: AROM, Left, 10 reps, Seated    General Comments        Pertinent Vitals/Pain Pain Assessment Pain Assessment: 0-10 Pain Score: 5  Pain Location: L hip Pain Descriptors / Indicators: Aching, Sore Pain Intervention(s): Limited activity within patient's tolerance, Monitored during session, Premedicated before session, Ice applied    Home Living  Prior Function            PT Goals (current goals can now be found in the care plan section) Acute Rehab PT Goals Patient Stated Goal: Regain IND PT Goal Formulation: With patient Time For Goal Achievement: 08/19/22 Potential to Achieve Goals: Good Progress towards PT goals: Progressing toward goals    Frequency    7X/week      PT Plan Current plan remains appropriate    Co-evaluation              AM-PAC PT  "6 Clicks" Mobility   Outcome Measure  Help needed turning from your back to your side while in a flat bed without using bedrails?: A Little Help needed moving from lying on your back to sitting on the side of a flat bed without using bedrails?: A Little Help needed moving to and from a bed to a chair (including a wheelchair)?: A Little Help needed standing up from a chair using your arms (e.g., wheelchair or bedside chair)?: A Little Help needed to walk in hospital room?: A Little Help needed climbing 3-5 steps with a railing? : A Lot 6 Click Score: 17    End of Session Equipment Utilized During Treatment: Gait belt Activity Tolerance: Patient tolerated treatment well Patient left: with call bell/phone within reach;in chair;with chair alarm set;with family/visitor present Nurse Communication: Mobility status PT Visit Diagnosis: Difficulty in walking, not elsewhere classified (R26.2)     Time: 5248-1859 PT Time Calculation (min) (ACUTE ONLY): 34 min  Charges:  $Gait Training: 8-22 mins $Therapeutic Exercise: 8-22 mins                     Mauro Kaufmann PT Acute Rehabilitation Services Pager [phone] Office [phone]    Cranford Blessinger 08/13/2022, 12:29 PM

## 2022-08-13 NOTE — Plan of Care (Signed)
  Problem: Education: Goal: Knowledge of General Education information will improve Description: Including pain rating scale, medication(s)/side effects and non-pharmacologic comfort measures Outcome: Progressing   Problem: Health Behavior/Discharge Planning: Goal: Ability to manage health-related needs will improve Outcome: Progressing   Problem: Activity: Goal: Risk for activity intolerance will decrease Outcome: Progressing   Problem: Pain Managment: Goal: General experience of comfort will improve Outcome: Progressing   Problem: Pain Management: Goal: Pain level will decrease Outcome: Progressing   Problem: Activity: Goal: Ability to tolerate increased activity will improve Outcome: Progressing   Problem: Pain Management: Goal: Pain level will decrease with appropriate interventions Outcome: Progressing

## 2022-08-14 ENCOUNTER — Ambulatory Visit (HOSPITAL_COMMUNITY): Admission: RE | Admit: 2022-08-14 | Payer: 59 | Source: Home / Self Care | Admitting: Orthopedic Surgery

## 2022-08-14 ENCOUNTER — Encounter (HOSPITAL_COMMUNITY)
Admission: EM | Disposition: A | Discharge status: Home / Self Care | Payer: Self-pay | Source: Home / Self Care | Attending: Internal Medicine

## 2022-08-14 DIAGNOSIS — S72002A Fracture of unspecified part of neck of left femur, initial encounter for closed fracture: Secondary | ICD-10-CM | POA: Diagnosis not present

## 2022-08-14 LAB — TYPE AND SCREEN
ABO/RH(D): O NEG
Antibody Screen: NEGATIVE

## 2022-08-14 LAB — CBC
HCT: 31.9 % — ABNORMAL LOW (ref 36.0–46.0)
Hemoglobin: 10.4 g/dL — ABNORMAL LOW (ref 12.0–15.0)
MCH: 31 pg (ref 26.0–34.0)
MCHC: 32.6 g/dL (ref 30.0–36.0)
MCV: 94.9 fL (ref 80.0–100.0)
Platelets: 198 10*3/uL (ref 150–400)
RBC: 3.36 MIL/uL — ABNORMAL LOW (ref 3.87–5.11)
RDW: 13.5 % (ref 11.5–15.5)
WBC: 10.3 10*3/uL (ref 4.0–10.5)
nRBC: 0 % (ref 0.0–0.2)

## 2022-08-14 LAB — BASIC METABOLIC PANEL
Anion gap: 7 (ref 5–15)
BUN: 21 mg/dL (ref 8–23)
CO2: 22 mmol/L (ref 22–32)
Calcium: 7.9 mg/dL — ABNORMAL LOW (ref 8.9–10.3)
Chloride: 106 mmol/L (ref 98–111)
Creatinine, Ser: 0.67 mg/dL (ref 0.44–1.00)
GFR, Estimated: >60 mL/min (ref >60)
Glucose, Bld: 116 mg/dL — ABNORMAL HIGH (ref 70–99)
Potassium: 3.8 mmol/L (ref 3.5–5.1)
Sodium: 135 mmol/L (ref 135–145)

## 2022-08-14 SURGERY — ARTHROPLASTY, HIP, TOTAL, ANTERIOR APPROACH
Anesthesia: Spinal | Site: Hip | Laterality: Left

## 2022-08-14 MED ORDER — ASPIRIN 81 MG PO CHEW: 81.0000 mg | CHEWABLE_TABLET | Freq: Two times a day (BID) | ORAL | 0 refills | Status: DC

## 2022-08-14 MED ORDER — OXYCODONE HCL 5 MG PO TABS: 5.0000 mg | ORAL_TABLET | Freq: Four times a day (QID) | ORAL | 0 refills | Status: DC | PRN

## 2022-08-14 MED ORDER — METHOCARBAMOL 500 MG PO TABS: 500.0000 mg | ORAL_TABLET | Freq: Four times a day (QID) | ORAL | 0 refills | Status: DC | PRN

## 2022-08-14 MED ORDER — POLYETHYLENE GLYCOL 3350 17 G PO PACK: 17.0000 g | PACK | Freq: Every day | ORAL | 0 refills | Status: DC | PRN

## 2022-08-14 MED ORDER — CARVEDILOL 6.25 MG PO TABS: 6.2500 mg | ORAL_TABLET | Freq: Two times a day (BID) | ORAL | 2 refills | Status: DC

## 2022-08-14 MED ORDER — SENNA 8.6 MG PO TABS: 1.0000 | ORAL_TABLET | Freq: Two times a day (BID) | ORAL | 0 refills | Status: DC

## 2022-08-14 SURGICAL SUPPLY — 32 items
BAG COUNTER SPONGE SURGICOUNT (BAG) IMPLANT
BAG DECANTER FOR FLEXI CONT (MISCELLANEOUS) IMPLANT
BAG ZIPLOCK 12X15 (MISCELLANEOUS) IMPLANT
BLADE SAG 18X100X1.27 (BLADE) ×1 IMPLANT
COVER PERINEAL POST (MISCELLANEOUS) ×1 IMPLANT
COVER SURGICAL LIGHT HANDLE (MISCELLANEOUS) ×1 IMPLANT
DERMABOND ADVANCED .7 DNX12 (GAUZE/BANDAGES/DRESSINGS) ×1 IMPLANT
DRAPE FOOT SWITCH (DRAPES) ×1 IMPLANT
DRAPE STERI IOBAN 125X83 (DRAPES) ×1 IMPLANT
DRAPE U-SHAPE 47X51 STRL (DRAPES) ×2 IMPLANT
DRESSING AQUACEL AG SP 3.5X10 (GAUZE/BANDAGES/DRESSINGS) ×1 IMPLANT
DRSG AQUACEL AG SP 3.5X10 (GAUZE/BANDAGES/DRESSINGS) ×1
DURAPREP 26ML APPLICATOR (WOUND CARE) ×1 IMPLANT
ELECT REM PT RETURN 15FT ADLT (MISCELLANEOUS) ×1 IMPLANT
GLOVE BIO SURGEON STRL SZ 6 (GLOVE) ×1 IMPLANT
GLOVE BIOGEL PI IND STRL 6.5 (GLOVE) ×1 IMPLANT
GLOVE BIOGEL PI IND STRL 7.5 (GLOVE) ×1 IMPLANT
GLOVE ORTHO TXT STRL SZ7.5 (GLOVE) ×2 IMPLANT
GOWN STRL REUS W/ TWL LRG LVL3 (GOWN DISPOSABLE) ×2 IMPLANT
GOWN STRL REUS W/TWL LRG LVL3 (GOWN DISPOSABLE) ×2
HOLDER FOLEY CATH W/STRAP (MISCELLANEOUS) ×1 IMPLANT
KIT TURNOVER KIT A (KITS) IMPLANT
PACK ANTERIOR HIP CUSTOM (KITS) ×1 IMPLANT
SUT MNCRL AB 4-0 PS2 18 (SUTURE) ×1 IMPLANT
SUT STRATAFIX 0 PDS 27 VIOLET (SUTURE) ×1
SUT VIC AB 1 CT1 36 (SUTURE) ×3 IMPLANT
SUT VIC AB 2-0 CT1 27 (SUTURE) ×2
SUT VIC AB 2-0 CT1 TAPERPNT 27 (SUTURE) ×2 IMPLANT
SUTURE STRATFX 0 PDS 27 VIOLET (SUTURE) ×1 IMPLANT
TRAY FOLEY MTR SLVR 16FR STAT (SET/KITS/TRAYS/PACK) IMPLANT
TUBE SUCTION HIGH CAP CLEAR NV (SUCTIONS) ×1 IMPLANT
WATER STERILE IRR 1000ML POUR (IV SOLUTION) ×1 IMPLANT

## 2022-08-14 NOTE — Discharge Summary (Signed)
[facility]  Physician Discharge Summary   Patient ID: Kimberly Lyons MRNROSELLE Lyons DOB/AGE: 1960-05-12 62 y.o.  Admit date: 08/10/2022 Discharge date:   08/14/2022   PCP: Karie Georges, MD  DISCHARGE DIAGNOSES:    Hip fracture, left, closed, initial encounter   AKI (acute kidney injury)   Chronic systolic HF (heart failure)   RECOMMENDATIONS FOR OUTPATIENT FOLLOW UP: Patient instructed to call her cardiologist if she experiences decrease in her blood pressure while on Entresto Patient to follow-up with orthopedics in 1 week.   Home Health: None Equipment/Devices: None  CODE STATUS: Full code  DISCHARGE CONDITION: fair  Diet recommendation: As before  INITIAL HISTORY: 62 y.o. female with medical history significant for systolic heart failure on Entresto, non-insulin-dependent diabetes recently started on metformin, morbid obesity admitted to the hospital with left hip fracture after mechanical fall.     Consultants: Orthopedics   Procedures: 4/13: Left total hip arthroplasty, anterior approach.    HOSPITAL COURSE:   Left hip fracture Secondary to mechanical fall.  Seen by orthopedics and underwent left total hip arthroplasty on 4/13.  Has done well postoperatively.  Seen by physical therapy.  Plan is for her to go home.  She has adequate support system.     Chronic systolic CHF/hypotension Followed by Dr. Antoine Poche. She used to have left ventricular EF of 30 to 35% back in 2020.  She was on carvedilol and Entresto.  Last echo from 2022 showed improvement in left ventricular EF to 60 to 65%. She was hypotensive during initial part of this admission.  Her carvedilol and Entresto were held.  Blood pressure stabilized.  She tolerated her surgery well.  Carvedilol resumed at a lower dose.  Sherryll Burger can be resumed at discharge.  Patient has a blood pressure machine at home.  She was told to check her blood pressures frequently and to call her cardiologist  office if her systolic blood pressure is less than 100.  She may also resume her Aldactone.   Acute kidney injury Baseline renal function is normal.  Aldactone placed on hold.  Entresto also on hold.   Renal function is back to baseline now.  Outpatient monitoring.   Obesity Estimated body mass index is 42.28 kg/m as calculated from the following:   Height as of this encounter: 5' 5.75" (1.67 m).   Weight as of this encounter: 117.9 kg.  Patient is stable.  Okay for discharge home today.  PERTINENT LABS:  The results of significant diagnostics from this hospitalization (including imaging, microbiology, ancillary and laboratory) are listed below for reference.    Microbiology: Recent Results (from the past 240 hour(s))  Surgical pcr screen     Status: None   Collection Time: 08/11/22  2:06 PM   Specimen: Nasal Mucosa; Nasal Swab  Result Value Ref Range Status   MRSA, PCR NEGATIVE NEGATIVE Final   Staphylococcus aureus NEGATIVE NEGATIVE Final    Comment: (NOTE) The Xpert SA Assay (FDA approved for NASAL specimens in patients 25 years of age and older), is one component of a comprehensive surveillance program. It is not intended to diagnose infection nor to guide or monitor treatment. Performed at [facility], [address], [place], [place] [address]      Labs:   Basic Metabolic Panel: Recent Labs  Lab 08/10/22 1046 08/10/22 1437 08/11/22 0406 08/13/22 0326 08/14/22 0330  NA 138  --  141 140 135  K 4.5  --  4.4 4.3 3.8  CL  109  --  109 110 106  CO2 21*  --  23 23 22   GLUCOSE 144*  --  138* 172* 116*  BUN 31*  --  27* 24* 21  CREATININE 1.24* 1.05* 1.14* 0.96 0.67  CALCIUM 9.2  --  9.0 8.3* 7.9*    CBC: Recent Labs  Lab 08/10/22 1046 08/10/22 1437 08/11/22 0406 08/13/22 0326 08/14/22 0330  WBC 12.1* 12.0* 9.1 12.1* 10.3  NEUTROABS 9.7*  --   --   --   --   HGB 12.8 12.0 12.1 11.0* 10.4*  HCT 39.0 37.1 37.5 33.8* 31.9*  MCV 92.6  94.4 95.9 95.8 94.9  PLT 268 246 232 216 198      IMAGING STUDIES DG Pelvis Portable  Result Date: 08/12/2022 CLINICAL DATA:  Postop. EXAM: PORTABLE PELVIS 1-2 VIEWS COMPARISON:  Preoperative imaging FINDINGS: Left hip arthroplasty in expected alignment. No periprosthetic lucency or fracture. Recent postsurgical change includes air and edema in the soft tissues. Lateral skin staples are partially included in the field of view. IMPRESSION: Left hip arthroplasty without immediate postoperative complication. Electronically Signed   By: Narda Rutherford M.D.   On: 08/12/2022 10:33   DG HIP UNILAT WITH PELVIS 1V LEFT  Result Date: 08/12/2022 CLINICAL DATA:  Elective surgery. EXAM: DG HIP (WITH OR WITHOUT PELVIS) 1V*L* COMPARISON:  Preoperative imaging. FINDINGS: Four fluoroscopic spot views of the pelvis and left hip obtained in the operating room. Interval left hip arthroplasty. Fluoroscopy time 17 seconds. Dose 3.1 mGy. IMPRESSION: Intraoperative fluoroscopy during left hip arthroplasty. Electronically Signed   By: Narda Rutherford M.D.   On: 08/12/2022 10:16   DG C-Arm 1-60 Min-No Report  Result Date: 08/12/2022 Fluoroscopy was utilized by the requesting physician.  No radiographic interpretation.   DG C-Arm 1-60 Min-No Report  Result Date: 08/12/2022 Fluoroscopy was utilized by the requesting physician.  No radiographic interpretation.   DG Knee Complete 4 Views Left  Result Date: 08/10/2022 CLINICAL DATA:  Left hip fracture, fall, pain EXAM: LEFT KNEE - COMPLETE 4+ VIEW COMPARISON:  08/10/2022 FINDINGS: Also limited because of positioning. Bones are osteopenic. Tricompartmental left knee osteoarthritis noted with joint space loss, sclerosis, and osteophyte formation. No acute displaced fracture, malalignment, or large effusion. No focal soft tissue abnormality. IMPRESSION: 1. Osteopenia and tricompartmental left knee osteoarthritis. 2. No acute osseous finding by plain radiography.  Electronically Signed   By: Judie Petit.  Shick M.D.   On: 08/10/2022 11:51   DG Hip Unilat With Pelvis 2-3 Views Left  Result Date: 08/10/2022 CLINICAL DATA:  Fall, left hip fracture EXAM: DG HIP (WITH OR WITHOUT PELVIS) 2-3V LEFT COMPARISON:  None Available. FINDINGS: Limited exam because of oblique positioning and body habitus. Deformity of the left hip noted compatible with a proximal left hip subcapital neck fracture. Right hip intact. Included pelvis grossly normal. Obese body habitus. IMPRESSION: Left hip subcapital neck fracture. Electronically Signed   By: Judie Petit.  Shick M.D.   On: 08/10/2022 11:50   DG Chest 2 View  Result Date: 08/10/2022 CLINICAL DATA:  Fall, left hip fracture EXAM: CHEST - 2 VIEW COMPARISON:  07/29/2018 FINDINGS: Limited exam with rotation to the left. Heart is enlarged. No acute CHF or definite pneumonia. No large effusion or pneumothorax. Lateral view is also limited with rotation and obliquity. IMPRESSION: Cardiomegaly without acute process. Limited exam because of positioning. Electronically Signed   By: Judie Petit.  Shick M.D.   On: 08/10/2022 11:48    DISCHARGE EXAMINATION: Vitals:   08/13/22 0928 08/13/22  1552 08/13/22 2136 08/14/22 0453  BP: 126/71 120/60 129/72 109/62  Pulse: 81 87 89 81  Resp: 15 16 18 17   Temp: 98 F (36.7 C) 98.1 F (36.7 C) 99.1 F (37.3 C) 98.3 F (36.8 C)  TempSrc:  Oral Oral   SpO2: 100% 99% 96% 94%  Weight:      Height:       General appearance: Awake alert.  In no distress Resp: Clear to auscultation bilaterally.  Normal effort Cardio: S1-S2 is normal regular.  No S3-S4.  No rubs murmurs or bruit GI: Abdomen is soft.  Nontender nondistended.  Bowel sounds are present normal.  No masses organomegaly   DISPOSITION: Home  Discharge Instructions     (HEART FAILURE PATIENTS) Call MD:  Anytime you have any of the following symptoms: 1) 3 pound weight gain in 24 hours or 5 pounds in 1 week 2) shortness of breath, with or without a dry hacking  cough 3) swelling in the hands, feet or stomach 4) if you have to sleep on extra pillows at night in order to breathe.   Complete by: As directed    Call MD for:  difficulty breathing, headache or visual disturbances   Complete by: As directed    Call MD for:  extreme fatigue   Complete by: As directed    Call MD for:  persistant dizziness or light-headedness   Complete by: As directed    Call MD for:  persistant nausea and vomiting   Complete by: As directed    Call MD for:  redness, tenderness, or signs of infection (pain, swelling, redness, odor or green/yellow discharge around incision site)   Complete by: As directed    Call MD for:  severe uncontrolled pain   Complete by: As directed    Call MD for:  temperature >100.4   Complete by: As directed    Diet - low sodium heart healthy   Complete by: As directed    Discharge instructions   Complete by: As directed    Please follow instructions provided by orthopedic surgeon.  Please check your blood pressures at home and seek attention if the systolic blood pressure is less than 100.  You were cared for by a hospitalist during your hospital stay. If you have any questions about your discharge medications or the care you received while you were in the hospital after you are discharged, you can call the unit and asked to speak with the hospitalist on call if the hospitalist that took care of you is not available. Once you are discharged, your primary care physician will handle any further medical issues. Please note that NO REFILLS for any discharge medications will be authorized once you are discharged, as it is imperative that you return to your primary care physician (or establish a relationship with a primary care physician if you do not have one) for your aftercare needs so that they can reassess your need for medications and monitor your lab values. If you do not have a primary care physician, you can call [phone] for a physician referral.    Increase activity slowly   Complete by: As directed           Allergies as of 08/14/2022   No Known Allergies      Medication List     TAKE these medications    aspirin 81 MG chewable tablet Chew 1 tablet (81 mg total) by mouth 2 (two) times daily.   carvedilol 6.25  MG tablet Commonly known as: COREG Take 1 tablet (6.25 mg total) by mouth 2 (two) times daily with a meal. What changed:  medication strength how much to take when to take this   Entresto 97-103 MG Generic drug: sacubitril-valsartan TAKE 1 TABLET BY MOUTH TWICE A DAY   furosemide 20 MG tablet Commonly known as: LASIX Take 20 mg by mouth daily as needed (fluid retention/high sodium food intake).   metFORMIN 500 MG tablet Commonly known as: GLUCOPHAGE Take 1 tablet (500 mg total) by mouth 2 (two) times daily with a meal.   naproxen sodium 220 MG tablet Commonly known as: ALEVE Take 220 mg by mouth 2 (two) times daily as needed (for pain).   oxyCODONE 5 MG immediate release tablet Commonly known as: Oxy IR/ROXICODONE Take 1 tablet (5 mg total) by mouth every 6 (six) hours as needed for severe pain.   polyethylene glycol 17 g packet Commonly known as: MIRALAX / GLYCOLAX Take 17 g by mouth daily as needed for mild constipation.   senna 8.6 MG Tabs tablet Commonly known as: SENOKOT Take 1 tablet (8.6 mg total) by mouth 2 (two) times daily.   spironolactone 25 MG tablet Commonly known as: ALDACTONE TAKE 1 TABLET (25 MG TOTAL) BY MOUTH DAILY.   Vitamin D3 25 MCG (1000 UT) capsule Generic drug: Cholecalciferol Take 1,000 Units by mouth daily.               Durable Medical Equipment  (From admission, onward)           Start     Ordered   08/13/22 1346  For home use only DME 3 n 1  Once       Comments: Patient is confined to a single room and not able to walk the distance required to go the bathroom, or he/she is unable to safely negotiate stairs required to access the bathroom.  A  3in1 BSC will alleviate this problem   08/13/22 1346   08/13/22 1345  For home use only DME Walker rolling  Once       Question Answer Comment  Walker: With 5 Inch Wheels   Patient needs a walker to treat with the following condition Difficulty in walking, not elsewhere classified      08/13/22 1346              Follow-up Information     Karie Georges, MD. Schedule an appointment as soon as possible for a visit in 1 week(s).   Specialty: Family Medicine Why: post hospitalization follow up Contact information: [address] Christena Flake [facility] [place] [address] [phone]         Samson Frederic, MD. Schedule an appointment as soon as possible for a visit in 1 week(s).   Specialty: Orthopedic Surgery Why: post hospitalization follow up, For wound re-check Contact information: [address] [place] [address] [phone]                 TOTAL DISCHARGE TIME: 35 minutes  Kimberly Lyons  [facility] Pager on [email or web address]  08/14/2022, 10:37 AM

## 2022-08-14 NOTE — Plan of Care (Signed)
  Problem: Education: Goal: Knowledge of General Education information will improve Description: Including pain rating scale, medication(s)/side effects and non-pharmacologic comfort measures Outcome: Completed/Met   Problem: Health Behavior/Discharge Planning: Goal: Ability to manage health-related needs will improve Outcome: Completed/Met   Problem: Clinical Measurements: Goal: Ability to maintain clinical measurements within normal limits will improve Outcome: Completed/Met Goal: Will remain free from infection Outcome: Completed/Met Goal: Diagnostic test results will improve Outcome: Completed/Met Goal: Cardiovascular complication will be avoided Outcome: Completed/Met   Problem: Activity: Goal: Risk for activity intolerance will decrease Outcome: Completed/Met   Problem: Nutrition: Goal: Adequate nutrition will be maintained Outcome: Completed/Met   Problem: Coping: Goal: Level of anxiety will decrease Outcome: Completed/Met   Problem: Elimination: Goal: Will not experience complications related to bowel motility Outcome: Completed/Met Goal: Will not experience complications related to urinary retention Outcome: Completed/Met   Problem: Pain Managment: Goal: General experience of comfort will improve Outcome: Completed/Met   Problem: Safety: Goal: Ability to remain free from injury will improve Outcome: Completed/Met   Problem: Skin Integrity: Goal: Risk for impaired skin integrity will decrease Outcome: Completed/Met   Problem: Education: Goal: Knowledge of General Education information will improve Description: Including pain rating scale, medication(s)/side effects and non-pharmacologic comfort measures Outcome: Completed/Met   Problem: Health Behavior/Discharge Planning: Goal: Ability to manage health-related needs will improve Outcome: Completed/Met   Problem: Clinical Measurements: Goal: Ability to maintain clinical measurements within normal  limits will improve Outcome: Completed/Met Goal: Will remain free from infection Outcome: Completed/Met Goal: Diagnostic test results will improve Outcome: Completed/Met Goal: Respiratory complications will improve Outcome: Completed/Met Goal: Cardiovascular complication will be avoided Outcome: Completed/Met   Problem: Activity: Goal: Risk for activity intolerance will decrease Outcome: Completed/Met   Problem: Nutrition: Goal: Adequate nutrition will be maintained Outcome: Completed/Met   Problem: Coping: Goal: Level of anxiety will decrease Outcome: Completed/Met   Problem: Elimination: Goal: Will not experience complications related to bowel motility Outcome: Completed/Met Goal: Will not experience complications related to urinary retention Outcome: Completed/Met   Problem: Pain Managment: Goal: General experience of comfort will improve Outcome: Completed/Met   Problem: Safety: Goal: Ability to remain free from injury will improve Outcome: Completed/Met   Problem: Skin Integrity: Goal: Risk for impaired skin integrity will decrease Outcome: Completed/Met   Problem: Education: Goal: Verbalization of understanding the information provided (i.e., activity precautions, restrictions, etc) will improve Outcome: Completed/Met Goal: Individualized Educational Video(s) Outcome: Completed/Met   Problem: Activity: Goal: Ability to ambulate and perform ADLs will improve Outcome: Completed/Met   Problem: Clinical Measurements: Goal: Postoperative complications will be avoided or minimized Outcome: Completed/Met   Problem: Self-Concept: Goal: Ability to maintain and perform role responsibilities to the fullest extent possible will improve Outcome: Completed/Met   Problem: Pain Management: Goal: Pain level will decrease Outcome: Completed/Met   Problem: Education: Goal: Knowledge of the prescribed therapeutic regimen will improve Outcome: Completed/Met Goal:  Understanding of discharge needs will improve Outcome: Completed/Met Goal: Individualized Educational Video(s) Outcome: Completed/Met   Problem: Activity: Goal: Ability to avoid complications of mobility impairment will improve Outcome: Completed/Met Goal: Ability to tolerate increased activity will improve Outcome: Completed/Met   Problem: Clinical Measurements: Goal: Postoperative complications will be avoided or minimized Outcome: Completed/Met   Problem: Pain Management: Goal: Pain level will decrease with appropriate interventions Outcome: Completed/Met   Problem: Skin Integrity: Goal: Will show signs of wound healing Outcome: Completed/Met

## 2022-08-14 NOTE — Progress Notes (Signed)
Physical Therapy Treatment Patient Details Name: Kimberly Lyons MRN: [id number] DOB: 1961/02/27 Today's Date: 08/14/2022   History of Present Illness Pt s/p fall with L hip fx and now s/p L THR.  Pt with hx of DM, systolic heart failure, and morbid obesity    PT Comments    Pt is progressing quite well this session, meeting goals and feels confident regarding d/c home today.   Gait distance and velocity improved this am with incr wt shift to LLE during gait as well. Pt reports her dtr and her niece (who is a Engineer, civil (consulting)) will be assisting as needed.     Recommendations for follow up therapy are one component of a multi-disciplinary discharge planning process, led by the attending physician.  Recommendations may be updated based on patient status, additional functional criteria and insurance authorization.  Follow Up Recommendations       Assistance Recommended at Discharge Intermittent Supervision/Assistance  Patient can return home with the following A little help with walking and/or transfers;A little help with bathing/dressing/bathroom;Assistance with cooking/housework;Assist for transportation;Help with stairs or ramp for entrance   Equipment Recommendations  Rolling walker (2 wheels)    Recommendations for Other Services OT consult     Precautions / Restrictions Precautions Precautions: Fall Restrictions Weight Bearing Restrictions: No LLE Weight Bearing: Weight bearing as tolerated     Mobility  Bed Mobility Overal bed mobility: Needs Assistance Bed Mobility: Supine to Sit, Sit to Supine     Supine to sit: Supervision Sit to supine: Supervision   General bed mobility comments: cues for LE management and use of UEs to self assist, pt able to use leg lift to return to supine without physical assist    Transfers Overall transfer level: Needs assistance Equipment used: Rolling walker (2 wheels) Transfers: Sit to/from Stand Sit to Stand: Supervision            General transfer comment: cues for LE management and use of UEs to self assist    Ambulation/Gait Ambulation/Gait assistance: Min guard, Supervision Gait Distance (Feet): 70 Feet Assistive device: Rolling walker (2 wheels) Gait Pattern/deviations: Step-to pattern, Shuffle, Decreased step length - right, Decreased step length - left, Trunk flexed Gait velocity: decr     General Gait Details: Cues for sequence, posture and position from RW; pt self cues end of distance   Stairs         General stair comments: pt verbalizes technique, reports no need to practice again   Wheelchair Mobility    Modified Rankin (Stroke Patients Only)       Balance Overall balance assessment: Needs assistance Sitting-balance support: No upper extremity supported, Feet supported Sitting balance-Leahy Scale: Good     Standing balance support: Single extremity supported Standing balance-Leahy Scale: Fair                              Cognition Arousal/Alertness: Awake/alert Behavior During Therapy: WFL for tasks assessed/performed Overall Cognitive Status: Within Functional Limits for tasks assessed                                          Exercises Total Joint Exercises Ankle Circles/Pumps: AROM, Both, 15 reps, Supine Quad Sets: AROM, Both, 5 reps Heel Slides: AROM, AAROM, Left, 10 reps    General Comments        Pertinent Vitals/Pain  Pain Assessment Pain Assessment: 0-10 Pain Score: 3  Pain Location: L hip Pain Descriptors / Indicators: Aching, Sore Pain Intervention(s): Limited activity within patient's tolerance, Monitored during session, Repositioned, RN gave pain meds during session    Home Living                          Prior Function            PT Goals (current goals can now be found in the care plan section) Acute Rehab PT Goals Patient Stated Goal: Regain IND PT Goal Formulation: With patient Time For Goal  Achievement: 08/19/22 Potential to Achieve Goals: Good Progress towards PT goals: Progressing toward goals    Frequency    7X/week      PT Plan Discharge plan needs to be updated    Co-evaluation              AM-PAC PT "6 Clicks" Mobility   Outcome Measure  Help needed turning from your back to your side while in a flat bed without using bedrails?: A Little Help needed moving from lying on your back to sitting on the side of a flat bed without using bedrails?: None Help needed moving to and from a bed to a chair (including a wheelchair)?: None Help needed standing up from a chair using your arms (e.g., wheelchair or bedside chair)?: A Little Help needed to walk in hospital room?: A Little Help needed climbing 3-5 steps with a railing? : A Little 6 Click Score: 20    End of Session Equipment Utilized During Treatment: Gait belt Activity Tolerance: Patient tolerated treatment well Patient left: with call bell/phone within reach;in bed;with bed alarm set Nurse Communication: Mobility status PT Visit Diagnosis: Difficulty in walking, not elsewhere classified (R26.2)     Time: 1126-1140 PT Time Calculation (min) (ACUTE ONLY): 14 min  Charges:  $Gait Training: 8-22 mins                     Delice Bison, PT  Acute Rehab Dept [facility]) [phone]  08/14/2022    [facility] 08/14/2022, 11:59 AM

## 2022-08-14 NOTE — Progress Notes (Addendum)
    Subjective:  Patient reports pain as mild to moderate.  Denies N/V/CP/SOB/Abd pain. She denies any tingling or numbness in LE bilaterally. She is eager for d/c home.   Objective:   VITALS:   Vitals:   08/13/22 0928 08/13/22 1552 08/13/22 2136 08/14/22 0453  BP: 126/71 120/60 129/72 109/62  Pulse: 81 87 89 81  Resp: 15 16 18 17   Temp: 98 F (36.7 C) 98.1 F (36.7 C) 99.1 F (37.3 C) 98.3 F (36.8 C)  TempSrc:  Oral Oral   SpO2: 100% 99% 96% 94%  Weight:      Height:        Patient sitting up in recliner. NAD.  Neurologically intact ABD soft Neurovascular intact Sensation intact distally Intact pulses distally Dorsiflexion/Plantar flexion intact Incision: dressing C/D/I No cellulitis present Compartment soft Painless log rolling of the hip.   Lab Results  Component Value Date   WBC 10.3 08/14/2022   HGB 10.4 (L) 08/14/2022   HCT 31.9 (L) 08/14/2022   MCV 94.9 08/14/2022   PLT 198 08/14/2022   BMET    Component Value Date/Time   NA 135 08/14/2022 0330   NA 140 05/30/2022 1005   K 3.8 08/14/2022 0330   CL 106 08/14/2022 0330   CO2 22 08/14/2022 0330   GLUCOSE 116 (H) 08/14/2022 0330   BUN 21 08/14/2022 0330   BUN 22 05/30/2022 1005   CREATININE 0.67 08/14/2022 0330   CALCIUM 7.9 (L) 08/14/2022 0330   EGFR 71 05/30/2022 1005   GFRNONAA >60 08/14/2022 0330     Assessment/Plan: 2 Days Post-Op   Principal Problem:   Hip fracture, left, closed, initial encounter Active Problems:   BMI 45.0-49.9, adult   Prediabetes   AKI (acute kidney injury)   Morbid obesity   Chronic systolic HF (heart failure)   WBAT with walker DVT ppx: Aspirin, SCDs, TEDS PO pain control PT/OT: Patient ambulated well with PT 110 and 48 feet. Dispo: Patient under care of the medical team. Disposition per their recommendation. Plans for d/c home.    Clois Dupes, PA-C 08/14/2022, 1:31 PM   [facility]  [place] [address], [address], [place], [place]  [address] Phone: [phone] [email or web address] Facebook  [employer]

## 2022-08-14 NOTE — Discharge Instructions (Signed)
? ?Dr. Brian Swinteck ?Joint Replacement Specialist ?[facility] ?[address] ?[place], [place] [address] ?[phone] ? ? ?TOTAL HIP REPLACEMENT POSTOPERATIVE DIRECTIONS ? ? ? ?Hip Rehabilitation, Guidelines Following Surgery  ? ?WEIGHT BEARING ?Weight bearing as tolerated with assist device (walker, cane, etc) as directed, use it as long as suggested by your surgeon or therapist, typically at least 4-6 weeks. ? ?The results of a hip operation are greatly improved after range of motion and muscle strengthening exercises. Follow all safety measures which are given to protect your hip. If any of these exercises cause increased pain or swelling in your joint, decrease the amount until you are comfortable again. Then slowly increase the exercises. Call your caregiver if you have problems or questions.  ? ?HOME CARE INSTRUCTIONS  ?Most of the following instructions are designed to prevent the dislocation of your new hip.  ?Remove items at home which could result in a fall. This includes throw rugs or furniture in walking pathways.  ?Continue medications as instructed at time of discharge. ?You may have some home medications which will be placed on hold until you complete the course of blood thinner medication. ?You may start showering once you are discharged home. Do not remove your dressing. ?Do not put on socks or shoes without following the instructions of your caregivers.   ?Sit on chairs with arms. Use the chair arms to help push yourself up when arising.  ?Arrange for the use of a toilet seat elevator so you are not sitting low.  ?Walk with walker as instructed.  ?You may resume a sexual relationship in one month or when given the OK by your caregiver.  ?Use walker as long as suggested by your caregivers.  ?You may put full weight on your legs and walk as much as is comfortable. ?Avoid periods of inactivity such as sitting longer than an hour when not asleep. This helps prevent blood  clots.  ?You may return to work once you are cleared by your surgeon.  ?Do not drive a car for 6 weeks or until released by your surgeon.  ?Do not drive while taking narcotics.  ?Wear elastic stockings for two weeks following surgery during the day but you may remove then at night.  ?Make sure you keep all of your appointments after your operation with all of your doctors and caregivers. You should call the office at the above phone number and make an appointment for approximately two weeks after the date of your surgery. ?Please pick up a stool softener and laxative for home use as long as you are requiring pain medications. ?ICE to the affected hip every three hours for 30 minutes at a time and then as needed for pain and swelling. Continue to use ice on the hip for pain and swelling from surgery. You may notice swelling that will progress down to the foot and ankle.  This is normal after surgery.  Elevate the leg when you are not up walking on it.   ?It is important for you to complete the blood thinner medication as prescribed by your doctor. ?Continue to use the breathing machine which will help keep your temperature down.  It is common for your temperature to cycle up and down following surgery, especially at night when you are not up moving around and exerting yourself.  The breathing machine keeps your lungs expanded and your temperature down. ? ?RANGE OF MOTION AND STRENGTHENING EXERCISES  ?These exercises are designed to help you   keep full movement of your hip joint. Follow your caregiver's or physical therapist's instructions. Perform all exercises about fifteen times, three times per day or as directed. Exercise both hips, even if you have had only one joint replacement. These exercises can be done on a training (exercise) mat, on the floor, on a table or on a bed. Use whatever works the best and is most comfortable for you. Use music or television while you are exercising so that the exercises are a  pleasant break in your day. This will make your life better with the exercises acting as a break in routine you can look forward to.  ?Lying on your back, slowly slide your foot toward your buttocks, raising your knee up off the floor. Then slowly slide your foot back down until your leg is straight again.  ?Lying on your back spread your legs as far apart as you can without causing discomfort.  ?Lying on your side, raise your upper leg and foot straight up from the floor as far as is comfortable. Slowly lower the leg and repeat.  ?Lying on your back, tighten up the muscle in the front of your thigh (quadriceps muscles). You can do this by keeping your leg straight and trying to raise your heel off the floor. This helps strengthen the largest muscle supporting your knee.  ?Lying on your back, tighten up the muscles of your buttocks both with the legs straight and with the knee bent at a comfortable angle while keeping your heel on the floor.  ? ?SKILLED REHAB INSTRUCTIONS: ?If the patient is transferred to a skilled rehab facility following release from the hospital, a list of the current medications will be sent to the facility for the patient to continue.  When discharged from the skilled rehab facility, please have the facility set up the patient's Home Health Physical Therapy prior to being released. Also, the skilled facility will be responsible for providing the patient with their medications at time of release from the facility to include their pain medication and their blood thinner medication. If the patient is still at the rehab facility at time of the two week follow up appointment, the skilled rehab facility will also need to assist the patient in arranging follow up appointment in our office and any transportation needs. ? ?POST-OPERATIVE OPIOID TAPER INSTRUCTIONS: ?It is important to wean off of your opioid medication as soon as possible. If you do not need pain medication after your surgery it is ok  to stop day one. ?Opioids include: ?Codeine, Hydrocodone(Norco, Vicodin), Oxycodone(Percocet, oxycontin) and hydromorphone amongst others.  ?Long term and even short term use of opiods can cause: ?Increased pain response ?Dependence ?Constipation ?Depression ?Respiratory depression ?And more.  ?Withdrawal symptoms can include ?Flu like symptoms ?Nausea, vomiting ?And more ?Techniques to manage these symptoms ?Hydrate well ?Eat regular healthy meals ?Stay active ?Use relaxation techniques(deep breathing, meditating, yoga) ?Do Not substitute Alcohol to help with tapering ?If you have been on opioids for less than two weeks and do not have pain than it is ok to stop all together.  ?Plan to wean off of opioids ?This plan should start within one week post op of your joint replacement. ?Maintain the same interval or time between taking each dose and first decrease the dose.  ?Cut the total daily intake of opioids by one tablet each day ?Next start to increase the time between doses. ?The last dose that should be eliminated is the evening dose.  ? ? ?MAKE   SURE YOU:  ?Understand these instructions.  ?Will watch your condition.  ?Will get help right away if you are not doing well or get worse. ? ?Pick up stool softner and laxative for home use following surgery while on pain medications. ?Do not remove your dressing. ?The dressing is waterproof--it is OK to take showers. ?Continue to use ice for pain and swelling after surgery. ?Do not use any lotions or creams on the incision until instructed by your surgeon. ?Total Hip Protocol. ? ?

## 2022-08-14 NOTE — Plan of Care (Signed)
°  Problem: Coping: °Goal: Level of anxiety will decrease °Outcome: Progressing °  °

## 2022-08-15 ENCOUNTER — Telehealth: Payer: Self-pay

## 2022-08-15 ENCOUNTER — Encounter (HOSPITAL_COMMUNITY): Payer: Self-pay | Admitting: Orthopedic Surgery

## 2022-08-15 NOTE — Transitions of Care (Post Inpatient/ED Visit) (Signed)
   08/15/2022  Name: Kimberly Lyons MRN: [id number] DOB: 05/27/1960  Today's TOC FU Call Status: Today's TOC FU Call Status:: Successful TOC FU Call Competed TOC FU Call Complete Date: 08/15/22  Transition Care Management Follow-up Telephone Call Date of Discharge: 08/14/22 Discharge Facility: [facility]) Type of Discharge: Inpatient Admission Primary Inpatient Discharge Diagnosis:: Fall; unspecified How have you been since you were released from the hospital?: Better Any questions or concerns?: No  Items Reviewed: Did you receive and understand the discharge instructions provided?: Yes Medications obtained and verified?: Yes (Medications Reviewed) Any new allergies since your discharge?: No Dietary orders reviewed?: NA Do you have support at home?: Yes People in Home: other relative(s), child(ren), adult  Home Care and Equipment/Supplies: Were Home Health Services Ordered?: No Any new equipment or medical supplies ordered?: Yes Were you able to get the equipment/medical supplies?: Yes Do you have any questions related to the use of the equipment/supplies?: No  Functional Questionnaire: Do you need assistance with bathing/showering or dressing?: Yes Do you need assistance with meal preparation?: Yes Do you need assistance with eating?: No Do you have difficulty maintaining continence: No Do you need assistance with getting out of bed/getting out of a chair/moving?: Yes Do you have difficulty managing or taking your medications?: No  Follow up appointments reviewed: PCP Follow-up appointment confirmed?: No (pt declined appt at this time) Specialist Hospital Follow-up appointment confirmed?: Yes Date of Specialist follow-up appointment?: 08/23/22 Follow-Up Specialty Provider:: Swinteck Do you need transportation to your follow-up appointment?: No Do you understand care options if your condition(s) worsen?: Yes-patient verbalized understanding    SIGNATURE Kandis Fantasia, LPN [facility] Health Advisor [facility] You Are. We Are. [employer] # [phone]

## 2022-08-20 ENCOUNTER — Encounter: Payer: Self-pay | Admitting: Cardiology

## 2022-09-18 ENCOUNTER — Other Ambulatory Visit: Payer: Self-pay | Admitting: Cardiology

## 2022-09-22 ENCOUNTER — Ambulatory Visit: Payer: 59 | Admitting: Family Medicine

## 2022-10-13 ENCOUNTER — Ambulatory Visit (INDEPENDENT_AMBULATORY_CARE_PROVIDER_SITE_OTHER): Payer: 59 | Admitting: Family Medicine

## 2022-10-13 ENCOUNTER — Encounter: Payer: Self-pay | Admitting: Family Medicine

## 2022-10-13 VITALS — BP 100/80 | HR 62 | Temp 97.7°F | Ht 65.75 in | Wt 256.0 lb

## 2022-10-13 DIAGNOSIS — R7303 Prediabetes: Secondary | ICD-10-CM

## 2022-10-13 DIAGNOSIS — S72002A Fracture of unspecified part of neck of left femur, initial encounter for closed fracture: Secondary | ICD-10-CM

## 2022-10-13 LAB — LIPID PANEL
Cholesterol: 160 mg/dL (ref 0–200)
HDL: 43.5 mg/dL (ref >39.00)
LDL Cholesterol: 87 mg/dL (ref 0–99)
NonHDL: 116.54
Total CHOL/HDL Ratio: 4
Triglycerides: 146 mg/dL (ref 0.0–149.0)
VLDL: 29.2 mg/dL (ref 0.0–40.0)

## 2022-10-13 MED ORDER — METFORMIN HCL 500 MG PO TABS
500.0000 mg | ORAL_TABLET | Freq: Two times a day (BID) | ORAL | 2 refills | Status: DC
Start: 2022-10-13 — End: 2023-09-06

## 2022-10-13 NOTE — Assessment & Plan Note (Signed)
Patient has lost 21 pounds since the beginning of the year despite falling and fracturing her hip. She continues on the low carb diet and metformin 500 mg BID, she is doing very well with this plan. Will see her back in 6 months for follow up and blood work. Lipid panel is ordered today for cardiac risk assessment.

## 2022-10-13 NOTE — Assessment & Plan Note (Signed)
Pt is healing well, ambulating well, no pain today.

## 2022-10-13 NOTE — Progress Notes (Signed)
Established Patient Office Visit  Subjective   Patient ID: Kimberly Lyons, female    DOB: [birth year]  Age: 62 y.o. MRN: [id number]  Chief Complaint  Patient presents with   Weight Check    Patient is here for hospital follow up -- states she fell, fractured her hip and had to have emergency replacement surgery. Pt reports she did very well after the surgery. States that her pain is gone now. Is ambulating with her cane. No complications after surgery. She continues in physical therapy also.   Prediabetes-- patient has lost 21 pounds since January. States she hasn't been able to exercise a much due to her hip fracture. States that she is gradually trying to increase exercise now that her hip is better. She reports that the metformin is helping reduce her appetite. She is sticking to the low carb diet plan that we discussed previously. Reports BM's are normal, no side effects from the metformin    Current Outpatient Medications  Medication Instructions   carvedilol (COREG) 25 MG tablet TAKE 1 TABLET BY MOUTH TWICE A DAY   carvedilol (COREG) 6.25 mg, Oral, 2 times daily with meals   furosemide (LASIX) 20 mg, Oral, Daily PRN   metFORMIN (GLUCOPHAGE) 500 mg, Oral, 2 times daily with meals   methocarbamol (ROBAXIN) 500 mg, Oral, Every 6 hours PRN   naproxen sodium (ALEVE) 220 mg, Oral, 2 times daily PRN   oxyCODONE (OXY IR/ROXICODONE) 5 mg, Oral, Every 6 hours PRN   sacubitril-valsartan (ENTRESTO) 97-103 MG 1 tablet, Oral, 2 times daily   spironolactone (ALDACTONE) 25 mg, Oral, Daily   Vitamin D3 1,000 Units, Oral, Daily    Patient Active Problem List   Diagnosis Date Noted   Hip fracture, left, closed, initial encounter (HCC) 08/10/2022   Chronic systolic HF (heart failure) (HCC) 07/16/2019   Hyperglycemia 02/12/2019   Morbid obesity (HCC) 02/12/2019   AKI (acute kidney injury) (HCC) 09/05/2018   Medication management 09/05/2018   Educated about COVID-19 virus infection 08/27/2018    Essential hypertension 08/27/2018   BMI 45.0-49.9, adult (HCC) 08/26/2018   Prediabetes 08/26/2018   Heart failure (HCC) 07/28/2018   Acute heart failure (HCC) 07/28/2018      Review of Systems  All other systems reviewed and are negative.     Objective:     BP 100/80 (BP Location: Right Arm, Patient Position: Sitting, Cuff Size: Large)   Pulse 62   Temp 97.7 F (36.5 C) (Oral)   Ht 5' 5.75" (1.67 m)   Wt 256 lb (116.1 kg)   SpO2 98%   BMI 41.63 kg/m    Physical Exam Vitals reviewed.  Constitutional:      Appearance: Normal appearance. She is well-groomed. She is obese.  Eyes:     Conjunctiva/sclera: Conjunctivae normal.  Neck:     Thyroid: No thyromegaly.  Cardiovascular:     Rate and Rhythm: Normal rate and regular rhythm.     Pulses: Normal pulses.     Heart sounds: S1 normal and S2 normal.  Pulmonary:     Effort: Pulmonary effort is normal.     Breath sounds: Normal breath sounds and air entry.  Musculoskeletal:     Right lower leg: No edema.     Left lower leg: No edema.  Neurological:     Mental Status: She is alert and oriented to person, place, and time. Mental status is at baseline.     Gait: Gait is intact.  Psychiatric:  Mood and Affect: Mood and affect normal.        Speech: Speech normal.        Behavior: Behavior normal.        Judgment: Judgment normal.      No results found for any visits on 10/13/22.    The 10-year ASCVD risk score (Arnett DK, et al., 2019) is: 4.1%    Assessment & Plan:  Hip fracture, left, closed, initial encounter [facility]) Assessment & Plan: Pt is healing well, ambulating well, no pain today.    Prediabetes Assessment & Plan: Patient has lost 21 pounds since the beginning of the year despite falling and fracturing her hip. She continues on the low carb diet and metformin 500 mg BID, she is doing very well with this plan. Will see her back in 6 months for follow up and blood work. Lipid panel is ordered  today for cardiac risk assessment.   Orders: -     metFORMIN HCl; Take 1 tablet (500 mg total) by mouth 2 (two) times daily with a meal.  Dispense: 180 tablet; Refill: 2 -     Lipid panel     Return in about 6 months (around 04/14/2023) for follow up prediabetes.    Karie Georges, MD

## 2022-12-15 ENCOUNTER — Other Ambulatory Visit: Payer: Self-pay | Admitting: Cardiology

## 2022-12-15 MED ORDER — ENTRESTO 97-103 MG PO TABS: 1.0000 | ORAL_TABLET | Freq: Two times a day (BID) | ORAL | 1 refills | Status: DC

## 2022-12-26 ENCOUNTER — Encounter: Payer: Self-pay | Admitting: Family Medicine

## 2023-01-22 ENCOUNTER — Telehealth: Payer: Self-pay | Admitting: Family Medicine

## 2023-01-22 NOTE — Telephone Encounter (Signed)
Patient dropped off document Surgical Clearance, to be filled out by provider. Patient requested to send it back via Fax within 7-days. Document is located in providers tray at front office.Please advise at  fax [phone]

## 2023-01-23 ENCOUNTER — Encounter: Payer: Self-pay | Admitting: Cardiology

## 2023-01-23 ENCOUNTER — Telehealth: Payer: Self-pay | Admitting: *Deleted

## 2023-01-23 NOTE — Telephone Encounter (Signed)
I left a message for the patient to call our office to schedule a tele visit for pre-op clearance.  

## 2023-01-23 NOTE — Telephone Encounter (Signed)
Name: [name]  DOB: [birth year]  MRN: [id number]  Primary Cardiologist: Rollene Rotunda, MD   Preoperative team, please contact this patient and set up a phone call appointment for further preoperative risk assessment. Please obtain consent and complete medication review. Thank you for your help.  I confirm that guidance regarding antiplatelet and oral anticoagulation therapy has been completed and, if necessary, noted below.  None requested.    Carlos Levering, NP 01/23/2023, 11:59 AM [facility]

## 2023-01-23 NOTE — Telephone Encounter (Signed)
Pre-operative Risk Assessment    Patient Name: Kimberly Lyons  DOB: 06/12/1960 MRN: [id number]   LAST OFFICE VISIT 05/30/22 NO FUTURE APPOINTMENTS SCHEDULED  Request for Surgical Clearance    Procedure:   LEFT TOTAL KNEE ARTHROPLASTY  Date of Surgery:  Clearance TBD                                 Surgeon:  DR. Samson Frederic Surgeon's Group or Practice Name:  [facility] Phone number:  [phone] Fax number:  [phone]   Type of Clearance Requested:   - Medical    Type of Anesthesia:  Spinal   Additional requests/questions:    Wilhemina Cash   01/23/2023, 8:33 AM

## 2023-01-24 DIAGNOSIS — Z0279 Encounter for issue of other medical certificate: Secondary | ICD-10-CM

## 2023-01-24 NOTE — Telephone Encounter (Signed)
Spoke with the patient and informed her PCP completed the form, charged a $29 fee and this was faxed, along with office notes from 10/13/2022 to [facility] at [phone] attn: Kerri Maze.  Placed in the area to be scanned.

## 2023-01-25 ENCOUNTER — Telehealth: Payer: Self-pay | Admitting: *Deleted

## 2023-01-25 NOTE — Telephone Encounter (Signed)
Patient scheduled.

## 2023-01-25 NOTE — Telephone Encounter (Signed)
  Patient Consent for Virtual Visit       Kimberly Lyons has provided verbal consent on 01/25/2023 for a virtual visit (video or telephone).   CONSENT FOR VIRTUAL VISIT FOR:  Kimberly Lyons  By participating in this virtual visit I agree to the following:  I hereby voluntarily request, consent and authorize [facility] and its employed or contracted physicians, physician assistants, nurse practitioners or other licensed health care professionals (the Practitioner), to provide me with telemedicine health care services (the "Services") as deemed necessary by the treating Practitioner. I acknowledge and consent to receive the Services by the Practitioner via telemedicine. I understand that the telemedicine visit will involve communicating with the Practitioner through live audiovisual communication technology and the disclosure of certain medical information by electronic transmission. I acknowledge that I have been given the opportunity to request an in-person assessment or other available alternative prior to the telemedicine visit and am voluntarily participating in the telemedicine visit.  I understand that I have the right to withhold or withdraw my consent to the use of telemedicine in the course of my care at any time, without affecting my right to future care or treatment, and that the Practitioner or I may terminate the telemedicine visit at any time. I understand that I have the right to inspect all information obtained and/or recorded in the course of the telemedicine visit and may receive copies of available information for a reasonable fee.  I understand that some of the potential risks of receiving the Services via telemedicine include:  Delay or interruption in medical evaluation due to technological equipment failure or disruption; Information transmitted may not be sufficient (e.g. poor resolution of images) to allow for appropriate medical decision making by the Practitioner;  and/or  In rare instances, security protocols could fail, causing a breach of personal health information.  Furthermore, I acknowledge that it is my responsibility to provide information about my medical history, conditions and care that is complete and accurate to the best of my ability. I acknowledge that Practitioner's advice, recommendations, and/or decision may be based on factors not within their control, such as incomplete or inaccurate data provided by me or distortions of diagnostic images or specimens that may result from electronic transmissions. I understand that the practice of medicine is not an exact science and that Practitioner makes no warranties or guarantees regarding treatment outcomes. I acknowledge that a copy of this consent can be made available to me via my patient portal [facility] MyChart), or I can request a printed copy by calling the office of [facility].    I understand that my insurance will be billed for this visit.   I have read or had this consent read to me. I understand the contents of this consent, which adequately explains the benefits and risks of the Services being provided via telemedicine.  I have been provided ample opportunity to ask questions regarding this consent and the Services and have had my questions answered to my satisfaction. I give my informed consent for the services to be provided through the use of telemedicine in my medical care

## 2023-01-31 NOTE — Progress Notes (Unsigned)
Virtual Visit via Telephone Note   Because of Kimberly Lyons's co-morbid illnesses, she is at least at moderate risk for complications without adequate follow up.  This format is felt to be most appropriate for this patient at this time.  The patient did not have access to video technology/had technical difficulties with video requiring transitioning to audio format only (telephone).  All issues noted in this document were discussed and addressed.  No physical exam could be performed with this format.  Please refer to the patient's chart for her consent to telehealth for [facility].  Evaluation Performed:  Preoperative cardiovascular risk assessment _____________   Date:  02/01/2023   Patient ID:  Kimberly Lyons, DOB 1965/02/03, MRN [id number] Patient Location:  Home Provider location:   Office  Primary Care Provider:  Karie Georges, MD Primary Cardiologist:  Rollene Rotunda, MD  Chief Complaint / Patient Profile   62 y.o. y/o female with a h/o Chronic HFrEF with normalization of EF per echo 05/2020, at 60 to 65%, with severe LVH and global hypokinesis.   He is pending Left Total Knee Arthroplasty, by Dr. Samson Frederic, on TBD,  and presents today for telephonic preoperative cardiovascular risk assessment.  History of Present Illness    Kimberly Lyons is a 62 y.o. female who presents via audio/video conferencing for a telehealth visit today.  Pt was last seen in cardiology clinic on 05/30/2022 by Dr. Antoine Poche.  At that time Kimberly Lyons was doing well .  The patient is now pending procedure as outlined above. Since her last visit, she has done very well.  She uses a walker for ambulation.  She is lost over 50 pounds with diet and metformin per scribed by her primary care provider.  The patient is pending bilateral knee replacement eventually.  She is starting with her left knee and once is healed from that wants to proceed with her right knee.  She is watching her salt, her  dry weight will need to be readjusted.  She denies any swelling, dyspnea on exertion, PND or orthopnea.  Past Medical History    Past Medical History:  Diagnosis Date   Acute systolic HF (heart failure) (HCC)    Heart disease    Hyperglycemia    Morbid obesity (HCC)    Past Surgical History:  Procedure Laterality Date   TOTAL HIP ARTHROPLASTY Left 08/12/2022   Procedure: TOTAL HIP ARTHROPLASTY ANTERIOR APPROACH;  Surgeon: Samson Frederic, MD;  Location: [facility];  Service: Orthopedics;  Laterality: Left;    Allergies  No Known Allergies  Home Medications    Prior to Admission medications   Medication Sig Start Date End Date Taking? Authorizing Provider  carvedilol (COREG) 25 MG tablet TAKE 1 TABLET BY MOUTH TWICE A DAY 09/18/22   Rollene Rotunda, MD  Cholecalciferol (VITAMIN D3) 25 MCG (1000 UT) capsule Take 1,000 Units by mouth daily.    [provider]  furosemide (LASIX) 20 MG tablet Take 20 mg by mouth daily as needed (fluid retention/high sodium food intake).    [provider]  metFORMIN (GLUCOPHAGE) 500 MG tablet Take 1 tablet (500 mg total) by mouth 2 (two) times daily with a meal. 10/13/22   Karie Georges, MD  methocarbamol (ROBAXIN) 500 MG tablet Take 1 tablet (500 mg total) by mouth every 6 (six) hours as needed for muscle spasms. 08/14/22   Hill, Alain Honey, PA-C  naproxen sodium (ALEVE) 220 MG tablet Take 220 mg by  mouth 2 (two) times daily as needed (for pain).    [provider]  sacubitril-valsartan (ENTRESTO) 97-103 MG Take 1 tablet by mouth 2 (two) times daily. 12/15/22   Rollene Rotunda, MD  spironolactone (ALDACTONE) 25 MG tablet TAKE 1 TABLET (25 MG TOTAL) BY MOUTH DAILY. 12/15/22   Rollene Rotunda, MD    Physical Exam    Vital Signs:  Kimberly Lyons does not have vital signs available for review today.   Given telephonic nature of communication, physical exam is limited. AAOx3. NAD. Normal affect.  Speech and respirations are  unlabored.  Accessory Clinical Findings    None  Assessment & Plan    1.  Preoperative Cardiovascular Risk Assessment:  According to the Revised Cardiac Risk Index (RCRI), her Perioperative Risk of Major Cardiac Event is (%): 6.6 Due to history  of CHF, but normalized EF per echo.   Her Functional Capacity in METs is: 5.04 according to the Duke Activity Status Index (DASI). Activity is limited due to knee pain, uses a walker for ambulation. This is not related to a cardiac etiology.  The patient was advised that if she develops new symptoms prior to surgery to contact our office to arrange for a follow-up visit, and she verbalized understanding.  Therefore, based on ACC/AHA guidelines, patient would be at acceptable risk for the planned procedure without further cardiovascular testing. I will route this recommendation to the requesting party via Epic fax function.    A copy of this note will be routed to requesting surgeon.  Time:   Today, I have spent 10 minutes with the patient with telehealth technology discussing medical history, symptoms, and management plan.     Joni Reining, NP  02/01/2023, 2:58 PM

## 2023-02-01 ENCOUNTER — Ambulatory Visit: Payer: 59 | Attending: Internal Medicine

## 2023-02-01 DIAGNOSIS — Z01810 Encounter for preprocedural cardiovascular examination: Secondary | ICD-10-CM | POA: Diagnosis not present

## 2023-02-01 DIAGNOSIS — Z01818 Encounter for other preprocedural examination: Secondary | ICD-10-CM

## 2023-02-22 NOTE — Progress Notes (Signed)
Pt. Needs orders for upcoming surgery.Thanks.

## 2023-02-22 NOTE — Patient Instructions (Addendum)
DUE TO COVID-19 ONLY TWO VISITORS  (aged 64 and older)  ARE ALLOWED TO COME WITH YOU AND STAY IN THE WAITING ROOM ONLY DURING PRE OP AND PROCEDURE.   **NO VISITORS ARE ALLOWED IN THE SHORT STAY AREA OR RECOVERY ROOM!!**  IF YOU WILL BE ADMITTED INTO THE HOSPITAL YOU ARE ALLOWED ONLY FOUR SUPPORT PEOPLE DURING VISITATION HOURS ONLY (7 AM -8PM)   The support person(s) must pass our screening, gel in and out, and wear a mask at all times, including in the patient's room. Patients must also wear a mask when staff or their support person are in the room. Visitors GUEST BADGE MUST BE WORN VISIBLY  One adult visitor may remain with you overnight and MUST be in the room by 8 P.M.     Your procedure is scheduled on: 03/07/23   Report to [facility] Main Entrance    Report to admitting at : 6:00 AM   Call this number if you have problems the morning of surgery [phone]   Do not eat food :After Midnight.   After Midnight you may have the following liquids until : 5:30 AM DAY OF SURGERY  Water Black Coffee (sugar ok, NO MILK/CREAM OR CREAMERS)  Tea (sugar ok, NO MILK/CREAM OR CREAMERS) regular and decaf                             Plain Jell-O (NO RED)                                           Fruit ices (not with fruit pulp, NO RED)                                     Popsicles (NO RED)                                                                  Juice: apple, WHITE grape, WHITE cranberry Sports drinks like Gatorade (NO RED)   FOLLOW ANY ADDITIONAL PRE OP INSTRUCTIONS YOU RECEIVED FROM YOUR SURGEON'S OFFICE!!!   Oral Hygiene is also important to reduce your risk of infection.                                    Remember - BRUSH YOUR TEETH THE MORNING OF SURGERY WITH YOUR REGULAR TOOTHPASTE  DENTURES WILL BE REMOVED PRIOR TO SURGERY PLEASE DO NOT APPLY "Poly grip" OR ADHESIVES!!!   Do NOT smoke after Midnight   Take these medicines the morning of surgery with A SIP OF  WATER: carvedilol.Tylenol as needed.  DO NOT TAKE ANY ORAL DIABETIC MEDICATIONS DAY OF YOUR SURGERY.                              You may not have any metal on your body including hair pins, jewelry, and body piercing  Do not wear make-up, lotions, powders, perfumes/cologne, or deodorant  Do not wear nail polish including gel and S&S, artificial/acrylic nails, or any other type of covering on natural nails including finger and toenails. If you have artificial nails, gel coating, etc. that needs to be removed by a nail salon please have this removed prior to surgery or surgery may need to be canceled/ delayed if the surgeon/ anesthesia feels like they are unable to be safely monitored.   Do not shave  48 hours prior to surgery.    Do not bring valuables to the hospital. [facility] IS NOT             RESPONSIBLE   FOR VALUABLES.   Contacts, glasses, or bridgework may not be worn into surgery.   Bring small overnight bag day of surgery.   DO NOT BRING YOUR HOME MEDICATIONS TO THE HOSPITAL. PHARMACY WILL DISPENSE MEDICATIONS LISTED ON YOUR MEDICATION LIST TO YOU DURING YOUR ADMISSION IN THE HOSPITAL!    Patients discharged on the day of surgery will not be allowed to drive home.  Someone NEEDS to stay with you for the first 24 hours after anesthesia.   Special Instructions: Bring a copy of your healthcare power of attorney and living will documents         the day of surgery if you haven't scanned them before.              Please read over the following fact sheets you were given: IF YOU HAVE QUESTIONS ABOUT YOUR PRE-OP INSTRUCTIONS PLEASE CALL [phone]      Pre-operative 5 CHG Bath Instructions   You can play a key role in reducing the risk of infection after surgery. Your skin needs to be as free of germs as possible. You can reduce the number of germs on your skin by washing with CHG (chlorhexidine gluconate) soap before surgery. CHG is an antiseptic soap that kills  germs and continues to kill germs even after washing.   DO NOT use if you have an allergy to chlorhexidine/CHG or antibacterial soaps. If your skin becomes reddened or irritated, stop using the CHG and notify one of our RNs at : [phone].   Please shower with the CHG soap starting 4 days before surgery using the following schedule:     Please keep in mind the following:  DO NOT shave, including legs and underarms, starting the day of your first shower.   You may shave your face at any point before/day of surgery.  Place clean sheets on your bed the day you start using CHG soap. Use a clean washcloth (not used since being washed) for each shower. DO NOT sleep with pets once you start using the CHG.   CHG Shower Instructions:  If you choose to wash your hair and private area, wash first with your normal shampoo/soap.  After you use shampoo/soap, rinse your hair and body thoroughly to remove shampoo/soap residue.  Turn the water OFF and apply about 3 tablespoons (45 ml) of CHG soap to a CLEAN washcloth.  Apply CHG soap ONLY FROM YOUR NECK DOWN TO YOUR TOES (washing for 3-5 minutes)  DO NOT use CHG soap on face, private areas, open wounds, or sores.  Pay special attention to the area where your surgery is being performed.  If you are having back surgery, having someone wash your back for you may be helpful. Wait 2 minutes after CHG soap is applied, then you may rinse  off the CHG soap.  Pat dry with a clean towel  Put on clean clothes/pajamas   If you choose to wear lotion, please use ONLY the CHG-compatible lotions on the back of this paper.     Additional instructions for the day of surgery: DO NOT APPLY any lotions, deodorants, cologne, or perfumes.   Put on clean/comfortable clothes.  Brush your teeth.  Ask your nurse before applying any prescription medications to the skin.   CHG Compatible Lotions   Aveeno Moisturizing lotion  Cetaphil Moisturizing Cream  Cetaphil  Moisturizing Lotion  Clairol Herbal Essence Moisturizing Lotion, Dry Skin  Clairol Herbal Essence Moisturizing Lotion, Extra Dry Skin  Clairol Herbal Essence Moisturizing Lotion, Normal Skin  Curel Age Defying Therapeutic Moisturizing Lotion with Alpha Hydroxy  Curel Extreme Care Body Lotion  Curel Soothing Hands Moisturizing Hand Lotion  Curel Therapeutic Moisturizing Cream, Fragrance-Free  Curel Therapeutic Moisturizing Lotion, Fragrance-Free  Curel Therapeutic Moisturizing Lotion, Original Formula  Eucerin Daily Replenishing Lotion  Eucerin Dry Skin Therapy Plus Alpha Hydroxy Crme  Eucerin Dry Skin Therapy Plus Alpha Hydroxy Lotion  Eucerin Original Crme  Eucerin Original Lotion  Eucerin Plus Crme Eucerin Plus Lotion  Eucerin TriLipid Replenishing Lotion  Keri Anti-Bacterial Hand Lotion  Keri Deep Conditioning Original Lotion Dry Skin Formula Softly Scented  Keri Deep Conditioning Original Lotion, Fragrance Free Sensitive Skin Formula  Keri Lotion Fast Absorbing Fragrance Free Sensitive Skin Formula  Keri Lotion Fast Absorbing Softly Scented Dry Skin Formula  Keri Original Lotion  Keri Skin Renewal Lotion Keri Silky Smooth Lotion  Keri Silky Smooth Sensitive Skin Lotion  Nivea Body Creamy Conditioning Oil  Nivea Body Extra Enriched Lotion  Nivea Body Original Lotion  Nivea Body Sheer Moisturizing Lotion Nivea Crme  Nivea Skin Firming Lotion  NutraDerm 30 Skin Lotion  NutraDerm Skin Lotion  NutraDerm Therapeutic Skin Cream  NutraDerm Therapeutic Skin Lotion  ProShield Protective Hand Cream  Provon moisturizing lotion   Incentive Spirometer  An incentive spirometer is a tool that can help keep your lungs clear and active. This tool measures how well you are filling your lungs with each breath. Taking long deep breaths may help reverse or decrease the chance of developing breathing (pulmonary) problems (especially infection) following: A long period of time when you are  unable to move or be active. BEFORE THE PROCEDURE  If the spirometer includes an indicator to show your best effort, your nurse or respiratory therapist will set it to a desired goal. If possible, sit up straight or lean slightly forward. Try not to slouch. Hold the incentive spirometer in an upright position. INSTRUCTIONS FOR USE  Sit on the edge of your bed if possible, or sit up as far as you can in bed or on a chair. Hold the incentive spirometer in an upright position. Breathe out normally. Place the mouthpiece in your mouth and seal your lips tightly around it. Breathe in slowly and as deeply as possible, raising the piston or the ball toward the top of the column. Hold your breath for 3-5 seconds or for as long as possible. Allow the piston or ball to fall to the bottom of the column. Remove the mouthpiece from your mouth and breathe out normally. Rest for a few seconds and repeat Steps 1 through 7 at least 10 times every 1-2 hours when you are awake. Take your time and take a few normal breaths between deep breaths. The spirometer may include an indicator to show your best effort. Use the  indicator as a goal to work toward during each repetition. After each set of 10 deep breaths, practice coughing to be sure your lungs are clear. If you have an incision (the cut made at the time of surgery), support your incision when coughing by placing a pillow or rolled up towels firmly against it. Once you are able to get out of bed, walk around indoors and cough well. You may stop using the incentive spirometer when instructed by your caregiver.  RISKS AND COMPLICATIONS Take your time so you do not get dizzy or light-headed. If you are in pain, you may need to take or ask for pain medication before doing incentive spirometry. It is harder to take a deep breath if you are having pain. AFTER USE Rest and breathe slowly and easily. It can be helpful to keep track of a log of your progress. Your  caregiver can provide you with a simple table to help with this. If you are using the spirometer at home, follow these instructions: SEEK MEDICAL CARE IF:  You are having difficultly using the spirometer. You have trouble using the spirometer as often as instructed. Your pain medication is not giving enough relief while using the spirometer. You develop fever of 100.5 F (38.1 C) or higher. SEEK IMMEDIATE MEDICAL CARE IF:  You cough up bloody sputum that had not been present before. You develop fever of 102 F (38.9 C) or greater. You develop worsening pain at or near the incision site. MAKE SURE YOU:  Understand these instructions. Will watch your condition. Will get help right away if you are not doing well or get worse. Document Released: 08/28/2006 Document Revised: 07/10/2011 Document Reviewed: 10/29/2006 [facility] Patient Information 2014 [place], [place].   ________________________________________________________________________

## 2023-02-23 ENCOUNTER — Other Ambulatory Visit: Payer: Self-pay

## 2023-02-23 ENCOUNTER — Encounter (HOSPITAL_COMMUNITY): Payer: Self-pay

## 2023-02-23 ENCOUNTER — Encounter (HOSPITAL_COMMUNITY)
Admission: RE | Admit: 2023-02-23 | Discharge: 2023-02-23 | Disposition: A | Discharge status: Home / Self Care | Payer: 59 | Source: Ambulatory Visit | Attending: Orthopedic Surgery | Admitting: Orthopedic Surgery

## 2023-02-23 VITALS — BP 108/69 | HR 65 | Temp 97.9°F | Ht 68.0 in | Wt 247.0 lb

## 2023-02-23 DIAGNOSIS — I110 Hypertensive heart disease with heart failure: Secondary | ICD-10-CM | POA: Diagnosis not present

## 2023-02-23 DIAGNOSIS — Z01818 Encounter for other preprocedural examination: Secondary | ICD-10-CM | POA: Insufficient documentation

## 2023-02-23 DIAGNOSIS — M1712 Unilateral primary osteoarthritis, left knee: Secondary | ICD-10-CM | POA: Diagnosis not present

## 2023-02-23 DIAGNOSIS — I5022 Chronic systolic (congestive) heart failure: Secondary | ICD-10-CM | POA: Diagnosis not present

## 2023-02-23 DIAGNOSIS — R7303 Prediabetes: Secondary | ICD-10-CM | POA: Diagnosis not present

## 2023-02-23 DIAGNOSIS — I10 Essential (primary) hypertension: Secondary | ICD-10-CM

## 2023-02-23 DIAGNOSIS — Z6837 Body mass index (BMI) 37.0-37.9, adult: Secondary | ICD-10-CM | POA: Diagnosis not present

## 2023-02-23 HISTORY — DX: Prediabetes: R73.03

## 2023-02-23 HISTORY — DX: Heart failure, unspecified: I50.9

## 2023-02-23 HISTORY — DX: Unspecified osteoarthritis, unspecified site: M19.90

## 2023-02-23 LAB — CBC
HCT: 43.4 % (ref 36.0–46.0)
Hemoglobin: 14 g/dL (ref 12.0–15.0)
MCH: 30.3 pg (ref 26.0–34.0)
MCHC: 32.3 g/dL (ref 30.0–36.0)
MCV: 93.9 fL (ref 80.0–100.0)
Platelets: 318 10*3/uL (ref 150–400)
RBC: 4.62 MIL/uL (ref 3.87–5.11)
RDW: 13.5 % (ref 11.5–15.5)
WBC: 9.3 10*3/uL (ref 4.0–10.5)
nRBC: 0 % (ref 0.0–0.2)

## 2023-02-23 LAB — BASIC METABOLIC PANEL
Anion gap: 9 (ref 5–15)
BUN: 19 mg/dL (ref 8–23)
CO2: 21 mmol/L — ABNORMAL LOW (ref 22–32)
Calcium: 9.2 mg/dL (ref 8.9–10.3)
Chloride: 107 mmol/L (ref 98–111)
Creatinine, Ser: 0.93 mg/dL (ref 0.44–1.00)
GFR, Estimated: >60 mL/min (ref >60)
Glucose, Bld: 111 mg/dL — ABNORMAL HIGH (ref 70–99)
Potassium: 4.4 mmol/L (ref 3.5–5.1)
Sodium: 137 mmol/L (ref 135–145)

## 2023-02-23 LAB — SURGICAL PCR SCREEN
MRSA, PCR: NEGATIVE
Staphylococcus aureus: NEGATIVE

## 2023-02-23 NOTE — Progress Notes (Signed)
For Short Stay: COVID SWAB appointment date:  Bowel Prep reminder:   For Anesthesia: PCP - Karie Georges, MD  Cardiologist - Rollene Rotunda, MD   Chest x-ray - 08/10/22 EKG - 4/11/241 Stress Test -  ECHO - /7/22 Cardiac Cath -  Pacemaker/ICD device last checked: Pacemaker orders received: Device Rep notified:  Spinal Cord Stimulator:  Sleep Study - N/A CPAP -   Fasting Blood Sugar - N/A Checks Blood Sugar __0___ times a day Date and result of last Hgb A1c- 6.1:05/30/22  Last dose of GLP1 agonist- N/A GLP1 instructions:   Last dose of SGLT-2 inhibitors- N/A SGLT-2 instructions:   Blood Thinner Instructions: N/A Aspirin Instructions: Last Dose:  Activity level: Can go up a flight of stairs and activities of daily living without stopping and without chest pain and/or shortness of breath      Unable to go up a flight of stairs due to knee pain    Anesthesia review: Hx: CHF,Pre-DIA.  Patient denies shortness of breath, fever, cough and chest pain at PAT appointment   Patient verbalized understanding of instructions that were given to them at the PAT appointment. Patient was also instructed that they will need to review over the PAT instructions again at home before surgery.

## 2023-02-26 NOTE — Progress Notes (Signed)
Anesthesia Chart Review   Case: [id number] Date/Time: 03/07/23 0815   Procedure: COMPUTER ASSISTED TOTAL KNEE ARTHROPLASTY (Left: Knee) - 160   Anesthesia type: Spinal   Pre-op diagnosis: Left knee osteoarthritis   Location[facility] ROOM 07 / [facility]   Surgeons: Samson Frederic, MD       DISCUSSION:62 y.o. never smoker with h/o Chronic HFrEF with normalization of EF per echo 05/2020, at 60 to 65%, with severe LVH and global hypokinesis, left knee OA scheduled for above procedure 03/07/2023 with Dr. Samson Frederic.   Per cardiology preoperative evaluation 02/01/2023, "According to the Revised Cardiac Risk Index (RCRI), her Perioperative Risk of Major Cardiac Event is (%): 6.6 Due to history  of CHF, but normalized EF per echo.    Her Functional Capacity in METs is: 5.04 according to the Duke Activity Status Index (DASI). Activity is limited due to knee pain, uses a walker for ambulation. This is not related to a cardiac etiology.   The patient was advised that if she develops new symptoms prior to surgery to contact our office to arrange for a follow-up visit, and she verbalized understanding.   Therefore, based on ACC/AHA guidelines, patient would be at acceptable risk for the planned procedure without further cardiovascular testing. I will route this recommendation to the requesting party via Epic fax function."   VS: BP 108/69   Pulse 65   Temp 36.6 C (Oral)   Ht 5\' 8"  (1.727 m)   Wt 112 kg   SpO2 97%   BMI 37.56 kg/m   PROVIDERS: Karie Georges, MD is PCP    LABS: Labs reviewed: Acceptable for surgery. (all labs ordered are listed, but only abnormal results are displayed)  Labs Reviewed  BASIC METABOLIC PANEL - Abnormal; Notable for the following components:      Result Value   CO2 21 (*)    Glucose, Bld 111 (*)    All other components within normal limits  SURGICAL PCR SCREEN  CBC     IMAGES:   EKG:   CV: Echo 05/07/20 1. Left ventricular ejection fraction, by  estimation, is 60 to 65%. The  left ventricle has normal function. The left ventricle has no regional  wall motion abnormalities. There is mild concentric left ventricular  hypertrophy. Left ventricular diastolic  parameters are consistent with Grade II diastolic dysfunction  (pseudonormalization).   2. Right ventricular systolic function is normal. The right ventricular  size is normal. There is normal pulmonary artery systolic pressure.   3. Left atrial size was mildly dilated.   4. The mitral valve is normal in structure. No evidence of mitral valve  regurgitation. No evidence of mitral stenosis.   5. The aortic valve is tricuspid. Aortic valve regurgitation is not  visualized. No aortic stenosis is present.   6. Aortic dilatation noted. There is mild dilatation of the ascending  aorta, measuring 37 mm.   7. The inferior vena cava is normal in size with greater than 50%  respiratory variability, suggesting right atrial pressure of 3 mmHg.   Myocardial Perfusion 01/02/2019 ST segment depression was noted during stress. This was only mildly changed from baseline. The perfusion study is normal. Frequent PVCs throughout the study. Unable to gate study, therefore cannot address wall motion or EF on these images. No prior study for comparison Low risk study based on perfusion images.   Past Medical History:  Diagnosis Date   Acute systolic HF (heart failure) (HCC)    Arthritis  CHF (congestive heart failure) (HCC)    Heart disease    Hyperglycemia    Morbid obesity (HCC)    Pre-diabetes     Past Surgical History:  Procedure Laterality Date   TOTAL HIP ARTHROPLASTY Left 08/12/2022   Procedure: TOTAL HIP ARTHROPLASTY ANTERIOR APPROACH;  Surgeon: Samson Frederic, MD;  Location: [facility];  Service: Orthopedics;  Laterality: Left;    MEDICATIONS:  acetaminophen (TYLENOL) 500 MG tablet   carvedilol (COREG) 25 MG tablet   Cholecalciferol (VITAMIN D3) 25 MCG (1000 UT) capsule    furosemide (LASIX) 20 MG tablet   metFORMIN (GLUCOPHAGE) 500 MG tablet   methocarbamol (ROBAXIN) 500 MG tablet   naproxen sodium (ALEVE) 220 MG tablet   sacubitril-valsartan (ENTRESTO) 97-103 MG   spironolactone (ALDACTONE) 25 MG tablet   No current facility-administered medications for this encounter.     Jodell Cipro Ward, PA-C [facility] Pre-Surgical Testing [phone]

## 2023-02-27 ENCOUNTER — Ambulatory Visit: Payer: Self-pay | Admitting: Student

## 2023-02-27 NOTE — H&P (View-Only) (Signed)
 TOTAL KNEE ADMISSION H&P  Patient is being admitted for left total knee arthroplasty.  Subjective:  Chief Complaint:left knee pain.  HPI: Kimberly Lyons, 62 y.o. female, has a history of pain and functional disability in the left knee due to arthritis and has failed non-surgical conservative treatments for greater than 12 weeks to includeNSAID's and/or analgesics, corticosteriod injections, flexibility and strengthening excercises, use of assistive devices, weight reduction as appropriate, and activity modification.  Onset of symptoms was gradual, starting 10 years ago with rapidlly worsening course since that time. The patient noted no past surgery on the left knee(s).  Patient currently rates pain in the left knee(s) at 10 out of 10 with activity. Patient has night pain, worsening of pain with activity and weight bearing, pain that interferes with activities of daily living, pain with passive range of motion, crepitus, and joint swelling.  Patient has evidence of subchondral cysts, subchondral sclerosis, periarticular osteophytes, and joint space narrowing by imaging studies. There is no active infection.  Patient Active Problem List   Diagnosis Date Noted   Hip fracture, left, closed, initial encounter (HCC) 08/10/2022   Chronic systolic HF (heart failure) (HCC) 07/16/2019   Hyperglycemia 02/12/2019   Morbid obesity (HCC) 02/12/2019   AKI (acute kidney injury) (HCC) 09/05/2018   Medication management 09/05/2018   Educated about COVID-19 virus infection 08/27/2018   Essential hypertension 08/27/2018   BMI 45.0-49.9, adult (HCC) 08/26/2018   Prediabetes 08/26/2018   Heart failure (HCC) 07/28/2018   Acute heart failure (HCC) 07/28/2018   Past Medical History:  Diagnosis Date   Acute systolic HF (heart failure) (HCC)    Arthritis    CHF (congestive heart failure) (HCC)    Heart disease    Hyperglycemia    Morbid obesity (HCC)    Pre-diabetes     Past Surgical History:  Procedure  Laterality Date   TOTAL HIP ARTHROPLASTY Left 08/12/2022   Procedure: TOTAL HIP ARTHROPLASTY ANTERIOR APPROACH;  Surgeon: Samson Frederic, MD;  Location: [facility];  Service: Orthopedics;  Laterality: Left;    Current Outpatient Medications  Medication Sig Dispense Refill Last Dose   acetaminophen (TYLENOL) 500 MG tablet Take 1,000 mg by mouth every 6 (six) hours as needed for moderate pain (pain score 4-6).      carvedilol (COREG) 25 MG tablet TAKE 1 TABLET BY MOUTH TWICE A DAY 180 tablet 1    Cholecalciferol (VITAMIN D3) 25 MCG (1000 UT) capsule Take 1,000 Units by mouth daily.      furosemide (LASIX) 20 MG tablet Take 20 mg by mouth daily as needed (fluid retention/high sodium food intake).      metFORMIN (GLUCOPHAGE) 500 MG tablet Take 1 tablet (500 mg total) by mouth 2 (two) times daily with a meal. 180 tablet 2    methocarbamol (ROBAXIN) 500 MG tablet Take 1 tablet (500 mg total) by mouth every 6 (six) hours as needed for muscle spasms. (Patient not taking: Reported on 02/20/2023) 20 tablet 0    naproxen sodium (ALEVE) 220 MG tablet Take 220 mg by mouth 2 (two) times daily as needed (for pain).      sacubitril-valsartan (ENTRESTO) 97-103 MG Take 1 tablet by mouth 2 (two) times daily. 180 tablet 1    spironolactone (ALDACTONE) 25 MG tablet TAKE 1 TABLET (25 MG TOTAL) BY MOUTH DAILY. 90 tablet 1    No current facility-administered medications for this visit.   No Known Allergies  Social History   Tobacco Use   Smoking status: Never  Smokeless tobacco: Never  Substance Use Topics   Alcohol use: Never    Family History  Problem Relation Age of Onset   Hyperlipidemia Mother    Hypertension Mother    Heart attack Mother 70   Cancer Father        Prostate   Hypertension Father    Cancer Sister    Diabetes Sister    Hyperlipidemia Sister      Review of Systems  Musculoskeletal:  Positive for arthralgias, gait problem and joint swelling.  All other systems reviewed and are  negative.   Objective:  Physical Exam HENT:     Head: Normocephalic and atraumatic.     Nose: Nose normal.     Mouth/Throat:     Mouth: Mucous membranes are moist.     Pharynx: Oropharynx is clear.  Eyes:     Conjunctiva/sclera: Conjunctivae normal.  Cardiovascular:     Rate and Rhythm: Normal rate and regular rhythm.     Pulses: Normal pulses.     Heart sounds: Normal heart sounds.  Pulmonary:     Effort: Pulmonary effort is normal.     Breath sounds: Normal breath sounds.  Abdominal:     General: Abdomen is flat.     Palpations: Abdomen is soft.  Genitourinary:    Comments: deferred Musculoskeletal:     Cervical back: Normal range of motion and neck supple.     Comments: Examination of the left knee reveals no skin wounds or lesions. She has swelling, trace effusion. No warmth or erythema. Valgus deformity. Tenderness to palpation medial joint line, lateral joint line, and peripatellar retinacular tissues with a positive grind sign. Range of motion 10 to 120 degrees without any ligamentous instability.  Sensory and motor function intact in LE bilaterally. Distal pedal pulses 2+ bilaterally.  No significant pedal edema. Calves soft and non-tender.  Skin:    General: Skin is warm and dry.     Capillary Refill: Capillary refill takes less than 2 seconds.  Neurological:     General: No focal deficit present.     Mental Status: She is alert and oriented to person, place, and time.  Psychiatric:        Mood and Affect: Mood normal.        Behavior: Behavior normal.        Thought Content: Thought content normal.        Judgment: Judgment normal.     Vital signs in last 24 hours: @VSRANGES @  Labs:   Estimated body mass index is 37.56 kg/m as calculated from the following:   Height as of 02/23/23: 5\' 8"  (1.727 m).   Weight as of 02/23/23: 112 kg.   Imaging Review Plain radiographs demonstrate severe degenerative joint disease of the left knee(s). The overall  alignment issignificant valgus. The bone quality appears to be adequate for age and reported activity level.      Assessment/Plan:  End stage arthritis, left knee   The patient history, physical examination, clinical judgment of the provider and imaging studies are consistent with end stage degenerative joint disease of the left knee(s) and total knee arthroplasty is deemed medically necessary. The treatment options including medical management, injection therapy arthroscopy and arthroplasty were discussed at length. The risks and benefits of total knee arthroplasty were presented and reviewed. The risks due to aseptic loosening, infection, stiffness, patella tracking problems, thromboembolic complications and other imponderables were discussed. The patient acknowledged the explanation, agreed to proceed with the plan and consent  was signed. Patient is being admitted for inpatient treatment for surgery, pain control, PT, OT, prophylactic antibiotics, VTE prophylaxis, progressive ambulation and ADL's and discharge planning. The patient is planning to be discharged home with OPPT.    Therapy Plans: outpatient therapy. PT at [facility] 03/12/23.  Disposition: Home with daughter Planned DVT Prophylaxis: aspirin 81mg  BID DME needed: Has rolling walker and cane. Will consider ice machine.  PCP: Cleared.  Cardiology: Cleared.  TXA: IV Allergies: NDKA.  Anesthesia Concerns: None.  BMI: 39.9 Last HgbA1c: 6.1 Other: - Left THA 08/12/22.  - HFrEF, LVH with hypokinesis.  - Prediabetes, metformin.  - Oxycodone, zofran, methocarbamol.  [facility].  - 02/23/23: Hgb 14.0, K+ 4.4, Cr. 0.93.     Patient's anticipated LOS is less than 2 midnights, meeting these requirements: - Younger than 95 - Lives within 1 hour of care - Has a competent adult at home to recover with post-op recover - NO history of  - Chronic pain requiring opiods  - Diabetes  - Coronary Artery Disease  -  Heart failure  - Heart attack  - Stroke  - DVT/VTE  - Cardiac arrhythmia  - Respiratory Failure/COPD  - Renal failure  - Anemia  - Advanced Liver disease

## 2023-02-27 NOTE — H&P (Signed)
TOTAL KNEE ADMISSION H&P  Patient is being admitted for left total knee arthroplasty.  Subjective:  Chief Complaint:left knee pain.  HPI: Kimberly Lyons, 62 y.o. female, has a history of pain and functional disability in the left knee due to arthritis and has failed non-surgical conservative treatments for greater than 12 weeks to includeNSAID's and/or analgesics, corticosteriod injections, flexibility and strengthening excercises, use of assistive devices, weight reduction as appropriate, and activity modification.  Onset of symptoms was gradual, starting 10 years ago with rapidlly worsening course since that time. The patient noted no past surgery on the left knee(s).  Patient currently rates pain in the left knee(s) at 10 out of 10 with activity. Patient has night pain, worsening of pain with activity and weight bearing, pain that interferes with activities of daily living, pain with passive range of motion, crepitus, and joint swelling.  Patient has evidence of subchondral cysts, subchondral sclerosis, periarticular osteophytes, and joint space narrowing by imaging studies. There is no active infection.  Patient Active Problem List   Diagnosis Date Noted   Hip fracture, left, closed, initial encounter (HCC) 08/10/2022   Chronic systolic HF (heart failure) (HCC) 07/16/2019   Hyperglycemia 02/12/2019   Morbid obesity (HCC) 02/12/2019   AKI (acute kidney injury) (HCC) 09/05/2018   Medication management 09/05/2018   Educated about COVID-19 virus infection 08/27/2018   Essential hypertension 08/27/2018   BMI 45.0-49.9, adult (HCC) 08/26/2018   Prediabetes 08/26/2018   Heart failure (HCC) 07/28/2018   Acute heart failure (HCC) 07/28/2018   Past Medical History:  Diagnosis Date   Acute systolic HF (heart failure) (HCC)    Arthritis    CHF (congestive heart failure) (HCC)    Heart disease    Hyperglycemia    Morbid obesity (HCC)    Pre-diabetes     Past Surgical History:  Procedure  Laterality Date   TOTAL HIP ARTHROPLASTY Left 08/12/2022   Procedure: TOTAL HIP ARTHROPLASTY ANTERIOR APPROACH;  Surgeon: Samson Frederic, MD;  Location: [facility];  Service: Orthopedics;  Laterality: Left;    Current Outpatient Medications  Medication Sig Dispense Refill Last Dose   acetaminophen (TYLENOL) 500 MG tablet Take 1,000 mg by mouth every 6 (six) hours as needed for moderate pain (pain score 4-6).      carvedilol (COREG) 25 MG tablet TAKE 1 TABLET BY MOUTH TWICE A DAY 180 tablet 1    Cholecalciferol (VITAMIN D3) 25 MCG (1000 UT) capsule Take 1,000 Units by mouth daily.      furosemide (LASIX) 20 MG tablet Take 20 mg by mouth daily as needed (fluid retention/high sodium food intake).      metFORMIN (GLUCOPHAGE) 500 MG tablet Take 1 tablet (500 mg total) by mouth 2 (two) times daily with a meal. 180 tablet 2    methocarbamol (ROBAXIN) 500 MG tablet Take 1 tablet (500 mg total) by mouth every 6 (six) hours as needed for muscle spasms. (Patient not taking: Reported on 02/20/2023) 20 tablet 0    naproxen sodium (ALEVE) 220 MG tablet Take 220 mg by mouth 2 (two) times daily as needed (for pain).      sacubitril-valsartan (ENTRESTO) 97-103 MG Take 1 tablet by mouth 2 (two) times daily. 180 tablet 1    spironolactone (ALDACTONE) 25 MG tablet TAKE 1 TABLET (25 MG TOTAL) BY MOUTH DAILY. 90 tablet 1    No current facility-administered medications for this visit.   No Known Allergies  Social History   Tobacco Use   Smoking status: Never  Smokeless tobacco: Never  Substance Use Topics   Alcohol use: Never    Family History  Problem Relation Age of Onset   Hyperlipidemia Mother    Hypertension Mother    Heart attack Mother 70   Cancer Father        Prostate   Hypertension Father    Cancer Sister    Diabetes Sister    Hyperlipidemia Sister      Review of Systems  Musculoskeletal:  Positive for arthralgias, gait problem and joint swelling.  All other systems reviewed and are  negative.   Objective:  Physical Exam HENT:     Head: Normocephalic and atraumatic.     Nose: Nose normal.     Mouth/Throat:     Mouth: Mucous membranes are moist.     Pharynx: Oropharynx is clear.  Eyes:     Conjunctiva/sclera: Conjunctivae normal.  Cardiovascular:     Rate and Rhythm: Normal rate and regular rhythm.     Pulses: Normal pulses.     Heart sounds: Normal heart sounds.  Pulmonary:     Effort: Pulmonary effort is normal.     Breath sounds: Normal breath sounds.  Abdominal:     General: Abdomen is flat.     Palpations: Abdomen is soft.  Genitourinary:    Comments: deferred Musculoskeletal:     Cervical back: Normal range of motion and neck supple.     Comments: Examination of the left knee reveals no skin wounds or lesions. She has swelling, trace effusion. No warmth or erythema. Valgus deformity. Tenderness to palpation medial joint line, lateral joint line, and peripatellar retinacular tissues with a positive grind sign. Range of motion 10 to 120 degrees without any ligamentous instability.  Sensory and motor function intact in LE bilaterally. Distal pedal pulses 2+ bilaterally.  No significant pedal edema. Calves soft and non-tender.  Skin:    General: Skin is warm and dry.     Capillary Refill: Capillary refill takes less than 2 seconds.  Neurological:     General: No focal deficit present.     Mental Status: She is alert and oriented to person, place, and time.  Psychiatric:        Mood and Affect: Mood normal.        Behavior: Behavior normal.        Thought Content: Thought content normal.        Judgment: Judgment normal.     Vital signs in last 24 hours: @VSRANGES @  Labs:   Estimated body mass index is 37.56 kg/m as calculated from the following:   Height as of 02/23/23: 5\' 8"  (1.727 m).   Weight as of 02/23/23: 112 kg.   Imaging Review Plain radiographs demonstrate severe degenerative joint disease of the left knee(s). The overall  alignment issignificant valgus. The bone quality appears to be adequate for age and reported activity level.      Assessment/Plan:  End stage arthritis, left knee   The patient history, physical examination, clinical judgment of the provider and imaging studies are consistent with end stage degenerative joint disease of the left knee(s) and total knee arthroplasty is deemed medically necessary. The treatment options including medical management, injection therapy arthroscopy and arthroplasty were discussed at length. The risks and benefits of total knee arthroplasty were presented and reviewed. The risks due to aseptic loosening, infection, stiffness, patella tracking problems, thromboembolic complications and other imponderables were discussed. The patient acknowledged the explanation, agreed to proceed with the plan and consent  was signed. Patient is being admitted for inpatient treatment for surgery, pain control, PT, OT, prophylactic antibiotics, VTE prophylaxis, progressive ambulation and ADL's and discharge planning. The patient is planning to be discharged home with OPPT.    Therapy Plans: outpatient therapy. PT at [facility] 03/12/23.  Disposition: Home with daughter Planned DVT Prophylaxis: aspirin 81mg  BID DME needed: Has rolling walker and cane. Will consider ice machine.  PCP: Cleared.  Cardiology: Cleared.  TXA: IV Allergies: NDKA.  Anesthesia Concerns: None.  BMI: 39.9 Last HgbA1c: 6.1 Other: - Left THA 08/12/22.  - HFrEF, LVH with hypokinesis.  - Prediabetes, metformin.  - Oxycodone, zofran, methocarbamol.  [facility].  - 02/23/23: Hgb 14.0, K+ 4.4, Cr. 0.93.     Patient's anticipated LOS is less than 2 midnights, meeting these requirements: - Younger than 95 - Lives within 1 hour of care - Has a competent adult at home to recover with post-op recover - NO history of  - Chronic pain requiring opiods  - Diabetes  - Coronary Artery Disease  -  Heart failure  - Heart attack  - Stroke  - DVT/VTE  - Cardiac arrhythmia  - Respiratory Failure/COPD  - Renal failure  - Anemia  - Advanced Liver disease

## 2023-03-05 ENCOUNTER — Other Ambulatory Visit: Payer: Self-pay | Admitting: Cardiology

## 2023-03-06 NOTE — Anesthesia Preprocedure Evaluation (Signed)
Anesthesia Evaluation  Patient identified by MRN, date of birth, ID band Patient awake    Reviewed: Allergy & Precautions, NPO status , Patient's Chart, lab work & pertinent test results, reviewed documented beta blocker date and time   History of Anesthesia Complications Negative for: history of anesthetic complications  Airway Mallampati: II  TM Distance: >3 FB Neck ROM: Full    Dental  (+) Dental Advisory Given   Pulmonary neg pulmonary ROS   Pulmonary exam normal        Cardiovascular hypertension, Pt. on home beta blockers and Pt. on medications Normal cardiovascular exam   '22 TTE - EF 60 to 65%. There is mild concentric left ventricular hypertrophy. Grade II diastolic dysfunction (pseudonormalization). Left atrial size was mildly dilated. There is mild dilatation of the ascending aorta, measuring 37 mm.     Neuro/Psych negative neurological ROS  negative psych ROS   GI/Hepatic negative GI ROS, Neg liver ROS,,,  Endo/Other    Morbid obesity Pre-DM   Renal/GU negative Renal ROS     Musculoskeletal  (+) Arthritis ,    Abdominal   Peds  Hematology negative hematology ROS (+)   Anesthesia Other Findings   Reproductive/Obstetrics                             Anesthesia Physical Anesthesia Plan  ASA: 3  Anesthesia Plan: Spinal   Post-op Pain Management: Regional block* and Tylenol PO (pre-op)*   Induction:   PONV Risk Score and Plan: 2 and Treatment may vary due to age or medical condition and Propofol infusion  Airway Management Planned: Natural Airway and Simple Face Mask  Additional Equipment: None  Intra-op Plan:   Post-operative Plan:   Informed Consent: I have reviewed the patients History and Physical, chart, labs and discussed the procedure including the risks, benefits and alternatives for the proposed anesthesia with the patient or authorized representative who  has indicated his/her understanding and acceptance.       Plan Discussed with: CRNA, Anesthesiologist and Surgeon  Anesthesia Plan Comments: (Labs reviewed, platelets acceptable. Discussed risks and benefits of spinal, including spinal/epidural hematoma, infection, failed block, and PDPH. Patient expressed understanding and wished to proceed. )        Anesthesia Quick Evaluation

## 2023-03-07 ENCOUNTER — Ambulatory Visit (HOSPITAL_BASED_OUTPATIENT_CLINIC_OR_DEPARTMENT_OTHER): Payer: 59 | Admitting: Anesthesiology

## 2023-03-07 ENCOUNTER — Encounter (HOSPITAL_COMMUNITY): Payer: Self-pay | Admitting: Orthopedic Surgery

## 2023-03-07 ENCOUNTER — Other Ambulatory Visit (HOSPITAL_COMMUNITY): Payer: Self-pay

## 2023-03-07 ENCOUNTER — Ambulatory Visit (HOSPITAL_COMMUNITY): Payer: 59 | Admitting: Physician Assistant

## 2023-03-07 ENCOUNTER — Other Ambulatory Visit: Payer: Self-pay

## 2023-03-07 ENCOUNTER — Ambulatory Visit (HOSPITAL_COMMUNITY): Payer: 59

## 2023-03-07 ENCOUNTER — Encounter (HOSPITAL_COMMUNITY)
Admission: RE | Disposition: A | Discharge status: Home / Self Care | Payer: Self-pay | Source: Home / Self Care | Attending: Orthopedic Surgery

## 2023-03-07 ENCOUNTER — Ambulatory Visit (HOSPITAL_COMMUNITY)
Admission: RE | Admit: 2023-03-07 | Discharge: 2023-03-07 | Disposition: A | Discharge status: Home / Self Care | Payer: 59 | Attending: Orthopedic Surgery | Admitting: Orthopedic Surgery

## 2023-03-07 DIAGNOSIS — Z6841 Body Mass Index (BMI) 40.0 and over, adult: Secondary | ICD-10-CM | POA: Diagnosis not present

## 2023-03-07 DIAGNOSIS — M1712 Unilateral primary osteoarthritis, left knee: Secondary | ICD-10-CM | POA: Diagnosis present

## 2023-03-07 DIAGNOSIS — I10 Essential (primary) hypertension: Secondary | ICD-10-CM | POA: Diagnosis not present

## 2023-03-07 DIAGNOSIS — I5022 Chronic systolic (congestive) heart failure: Secondary | ICD-10-CM | POA: Insufficient documentation

## 2023-03-07 DIAGNOSIS — I110 Hypertensive heart disease with heart failure: Secondary | ICD-10-CM | POA: Insufficient documentation

## 2023-03-07 DIAGNOSIS — E119 Type 2 diabetes mellitus without complications: Secondary | ICD-10-CM | POA: Diagnosis not present

## 2023-03-07 DIAGNOSIS — M1711 Unilateral primary osteoarthritis, right knee: Secondary | ICD-10-CM

## 2023-03-07 DIAGNOSIS — Z7984 Long term (current) use of oral hypoglycemic drugs: Secondary | ICD-10-CM | POA: Insufficient documentation

## 2023-03-07 HISTORY — PX: KNEE ARTHROPLASTY: SHX992

## 2023-03-07 SURGERY — ARTHROPLASTY, KNEE, TOTAL, USING IMAGELESS COMPUTER-ASSISTED NAVIGATION
Anesthesia: Spinal | Site: Knee | Laterality: Left

## 2023-03-07 MED ORDER — POLYETHYLENE GLYCOL 3350 17 GM/SCOOP PO POWD
17.0000 g | Freq: Every day | ORAL | 0 refills | Status: AC | PRN
  Filled 2023-03-07: qty 238, 14d supply, fill #0

## 2023-03-07 MED ORDER — PROPOFOL 10 MG/ML IV BOLUS
INTRAVENOUS | Status: AC
  Filled 2023-03-07: qty 20

## 2023-03-07 MED ORDER — METHOCARBAMOL 500 MG PO TABS
ORAL_TABLET | ORAL | Status: AC
  Filled 2023-03-07: qty 1

## 2023-03-07 MED ORDER — CHLORHEXIDINE GLUCONATE 0.12 % MT SOLN
15.0000 mL | Freq: Once | OROMUCOSAL | Status: AC
  Administered 2023-03-07: 15 mL via OROMUCOSAL

## 2023-03-07 MED ORDER — FENTANYL CITRATE (PF) 100 MCG/2ML IJ SOLN
INTRAMUSCULAR | Status: AC
  Filled 2023-03-07: qty 2

## 2023-03-07 MED ORDER — BUPIVACAINE IN DEXTROSE 0.75-8.25 % IT SOLN
INTRATHECAL | Status: DC | PRN
  Administered 2023-03-07: 1.6 mL via INTRATHECAL

## 2023-03-07 MED ORDER — METOCLOPRAMIDE HCL 5 MG PO TABS: 5.0000 mg | ORAL_TABLET | Freq: Three times a day (TID) | ORAL | Status: DC | PRN

## 2023-03-07 MED ORDER — MIDAZOLAM HCL 2 MG/2ML IJ SOLN
INTRAMUSCULAR | Status: AC
  Filled 2023-03-07: qty 2

## 2023-03-07 MED ORDER — ROPIVACAINE HCL 7.5 MG/ML IJ SOLN
INTRAMUSCULAR | Status: DC | PRN
  Administered 2023-03-07: 20 mL via PERINEURAL

## 2023-03-07 MED ORDER — TRANEXAMIC ACID-NACL 1000-0.7 MG/100ML-% IV SOLN
1000.0000 mg | INTRAVENOUS | Status: AC
  Administered 2023-03-07: 1000 mg via INTRAVENOUS
  Filled 2023-03-07: qty 100

## 2023-03-07 MED ORDER — HYDROMORPHONE HCL 1 MG/ML IJ SOLN
0.5000 mg | INTRAMUSCULAR | Status: DC | PRN
Start: 2023-03-07 — End: 2023-03-07

## 2023-03-07 MED ORDER — ISOPROPYL ALCOHOL 70 % SOLN
Status: AC
  Filled 2023-03-07: qty 480

## 2023-03-07 MED ORDER — PROPOFOL 500 MG/50ML IV EMUL
INTRAVENOUS | Status: AC
  Filled 2023-03-07: qty 50

## 2023-03-07 MED ORDER — PRONTOSAN WOUND IRRIGATION OPTIME
TOPICAL | Status: DC | PRN
  Administered 2023-03-07: 1 via TOPICAL

## 2023-03-07 MED ORDER — CEFAZOLIN SODIUM-DEXTROSE 2-4 GM/100ML-% IV SOLN: 2.0000 g | Freq: Four times a day (QID) | INTRAVENOUS | Status: DC

## 2023-03-07 MED ORDER — ONDANSETRON HCL 4 MG PO TABS
4.0000 mg | ORAL_TABLET | Freq: Three times a day (TID) | ORAL | 0 refills | Status: AC | PRN
  Filled 2023-03-07: qty 30, 10d supply, fill #0

## 2023-03-07 MED ORDER — OXYCODONE HCL 5 MG PO TABS
ORAL_TABLET | ORAL | Status: AC
  Filled 2023-03-07: qty 1

## 2023-03-07 MED ORDER — STERILE WATER FOR IRRIGATION IR SOLN
Status: DC | PRN
  Administered 2023-03-07: 1000 mL

## 2023-03-07 MED ORDER — PHENYLEPHRINE HCL-NACL 20-0.9 MG/250ML-% IV SOLN
INTRAVENOUS | Status: DC | PRN
  Administered 2023-03-07: 50 ug/min via INTRAVENOUS

## 2023-03-07 MED ORDER — ASPIRIN 81 MG PO CHEW
81.0000 mg | CHEWABLE_TABLET | Freq: Two times a day (BID) | ORAL | 0 refills | Status: AC
Start: 2023-03-07 — End: 2023-04-21
  Filled 2023-03-07: qty 90, 45d supply, fill #0

## 2023-03-07 MED ORDER — ONDANSETRON HCL 4 MG PO TABS: 4.0000 mg | ORAL_TABLET | Freq: Four times a day (QID) | ORAL | Status: DC | PRN

## 2023-03-07 MED ORDER — ORAL CARE MOUTH RINSE: 15.0000 mL | Freq: Once | OROMUCOSAL | Status: AC

## 2023-03-07 MED ORDER — PROPOFOL 1000 MG/100ML IV EMUL
INTRAVENOUS | Status: AC
  Filled 2023-03-07: qty 100

## 2023-03-07 MED ORDER — FENTANYL CITRATE (PF) 250 MCG/5ML IJ SOLN
INTRAMUSCULAR | Status: DC | PRN
  Administered 2023-03-07 (×2): 50 ug via INTRAVENOUS

## 2023-03-07 MED ORDER — 0.9 % SODIUM CHLORIDE (POUR BTL) OPTIME
TOPICAL | Status: DC | PRN
  Administered 2023-03-07: 1000 mL

## 2023-03-07 MED ORDER — POVIDONE-IODINE 10 % EX SWAB: 2.0000 | Freq: Once | CUTANEOUS | Status: DC

## 2023-03-07 MED ORDER — KETOROLAC TROMETHAMINE 30 MG/ML IJ SOLN
INTRAMUSCULAR | Status: AC
  Filled 2023-03-07: qty 1

## 2023-03-07 MED ORDER — SODIUM CHLORIDE 0.9 % IV SOLN: INTRAVENOUS | Status: DC

## 2023-03-07 MED ORDER — ACETAMINOPHEN 500 MG PO TABS: 1000.0000 mg | ORAL_TABLET | Freq: Four times a day (QID) | ORAL | Status: DC

## 2023-03-07 MED ORDER — METHOCARBAMOL 1000 MG/10ML IJ SOLN: 500.0000 mg | Freq: Four times a day (QID) | INTRAMUSCULAR | Status: DC | PRN

## 2023-03-07 MED ORDER — MIDAZOLAM HCL 2 MG/2ML IJ SOLN
INTRAMUSCULAR | Status: DC | PRN
  Administered 2023-03-07: 2 mg via INTRAVENOUS

## 2023-03-07 MED ORDER — LIDOCAINE 2% (20 MG/ML) 5 ML SYRINGE
INTRAMUSCULAR | Status: DC | PRN
  Administered 2023-03-07: 100 mg via INTRAVENOUS

## 2023-03-07 MED ORDER — LACTATED RINGERS IV SOLN: INTRAVENOUS | Status: DC

## 2023-03-07 MED ORDER — KETOROLAC TROMETHAMINE 15 MG/ML IJ SOLN: 15.0000 mg | Freq: Four times a day (QID) | INTRAMUSCULAR | Status: DC

## 2023-03-07 MED ORDER — SODIUM CHLORIDE 0.9 % IR SOLN
Status: DC | PRN
  Administered 2023-03-07: 3000 mL

## 2023-03-07 MED ORDER — SODIUM CHLORIDE (PF) 0.9 % IJ SOLN
INTRAMUSCULAR | Status: AC
  Filled 2023-03-07: qty 30

## 2023-03-07 MED ORDER — OXYCODONE HCL 5 MG PO TABS
5.0000 mg | ORAL_TABLET | ORAL | 0 refills | Status: DC | PRN
  Filled 2023-03-07: qty 42, 7d supply, fill #0

## 2023-03-07 MED ORDER — DEXAMETHASONE SODIUM PHOSPHATE 10 MG/ML IJ SOLN
INTRAMUSCULAR | Status: DC | PRN
  Administered 2023-03-07: 10 mg via INTRAVENOUS

## 2023-03-07 MED ORDER — OXYCODONE HCL 5 MG PO TABS: 5.0000 mg | ORAL_TABLET | ORAL | Status: DC | PRN

## 2023-03-07 MED ORDER — METOCLOPRAMIDE HCL 5 MG/ML IJ SOLN: 5.0000 mg | Freq: Three times a day (TID) | INTRAMUSCULAR | Status: DC | PRN

## 2023-03-07 MED ORDER — OXYCODONE HCL 5 MG PO TABS
5.0000 mg | ORAL_TABLET | Freq: Once | ORAL | Status: AC | PRN
  Administered 2023-03-07: 5 mg via ORAL

## 2023-03-07 MED ORDER — BUPIVACAINE-EPINEPHRINE 0.25% -1:200000 IJ SOLN
INTRAMUSCULAR | Status: AC
  Filled 2023-03-07: qty 1

## 2023-03-07 MED ORDER — LACTATED RINGERS IV BOLUS
500.0000 mL | Freq: Once | INTRAVENOUS | Status: AC
  Administered 2023-03-07: 500 mL via INTRAVENOUS

## 2023-03-07 MED ORDER — PROPOFOL 10 MG/ML IV BOLUS
INTRAVENOUS | Status: DC | PRN
  Administered 2023-03-07: 20 mg via INTRAVENOUS
  Administered 2023-03-07: 50 ug/kg/min via INTRAVENOUS

## 2023-03-07 MED ORDER — LACTATED RINGERS IV BOLUS: 250.0000 mL | Freq: Once | INTRAVENOUS | Status: DC

## 2023-03-07 MED ORDER — OXYCODONE HCL 5 MG/5ML PO SOLN: 5.0000 mg | Freq: Once | ORAL | Status: AC | PRN

## 2023-03-07 MED ORDER — SODIUM CHLORIDE (PF) 0.9 % IJ SOLN
INTRAMUSCULAR | Status: DC | PRN
  Administered 2023-03-07: 61 mL

## 2023-03-07 MED ORDER — ISOPROPYL ALCOHOL 70 % SOLN
Status: DC | PRN
  Administered 2023-03-07: 1 via TOPICAL

## 2023-03-07 MED ORDER — ONDANSETRON HCL 4 MG/2ML IJ SOLN
INTRAMUSCULAR | Status: DC | PRN
  Administered 2023-03-07: 4 mg via INTRAVENOUS

## 2023-03-07 MED ORDER — METHOCARBAMOL 500 MG PO TABS
500.0000 mg | ORAL_TABLET | Freq: Four times a day (QID) | ORAL | Status: DC | PRN
  Administered 2023-03-07: 500 mg via ORAL

## 2023-03-07 MED ORDER — METHOCARBAMOL 500 MG PO TABS
500.0000 mg | ORAL_TABLET | Freq: Four times a day (QID) | ORAL | 0 refills | Status: AC | PRN
  Filled 2023-03-07: qty 30, 8d supply, fill #0

## 2023-03-07 MED ORDER — FENTANYL CITRATE PF 50 MCG/ML IJ SOSY: 25.0000 ug | PREFILLED_SYRINGE | INTRAMUSCULAR | Status: DC | PRN

## 2023-03-07 MED ORDER — POVIDONE-IODINE 10 % EX SWAB
2.0000 | Freq: Once | CUTANEOUS | Status: AC
  Administered 2023-03-07: 2 via TOPICAL

## 2023-03-07 MED ORDER — ACETAMINOPHEN 325 MG PO TABS: 325.0000 mg | ORAL_TABLET | Freq: Four times a day (QID) | ORAL | Status: DC | PRN

## 2023-03-07 MED ORDER — ONDANSETRON HCL 4 MG/2ML IJ SOLN: 4.0000 mg | Freq: Four times a day (QID) | INTRAMUSCULAR | Status: DC | PRN

## 2023-03-07 MED ORDER — OXYCODONE HCL 5 MG PO TABS: 10.0000 mg | ORAL_TABLET | ORAL | Status: DC | PRN

## 2023-03-07 MED ORDER — CEFAZOLIN SODIUM-DEXTROSE 2-4 GM/100ML-% IV SOLN
2.0000 g | INTRAVENOUS | Status: AC
  Administered 2023-03-07: 2 g via INTRAVENOUS
  Filled 2023-03-07: qty 100

## 2023-03-07 MED ORDER — SENNA 8.6 MG PO TABS
2.0000 | ORAL_TABLET | Freq: Every day | ORAL | 0 refills | Status: AC
  Filled 2023-03-07: qty 30, 15d supply, fill #0

## 2023-03-07 MED ORDER — ONDANSETRON HCL 4 MG/2ML IJ SOLN: 4.0000 mg | Freq: Once | INTRAMUSCULAR | Status: DC | PRN

## 2023-03-07 MED ORDER — DOCUSATE SODIUM 100 MG PO CAPS
100.0000 mg | ORAL_CAPSULE | Freq: Two times a day (BID) | ORAL | 0 refills | Status: AC
Start: 2023-03-07 — End: 2023-04-06
  Filled 2023-03-07: qty 60, 30d supply, fill #0

## 2023-03-07 MED ORDER — ACETAMINOPHEN 500 MG PO TABS
1000.0000 mg | ORAL_TABLET | Freq: Once | ORAL | Status: AC
  Administered 2023-03-07: 1000 mg via ORAL
  Filled 2023-03-07: qty 2

## 2023-03-07 MED ORDER — EPHEDRINE SULFATE-NACL 50-0.9 MG/10ML-% IV SOSY
PREFILLED_SYRINGE | INTRAVENOUS | Status: DC | PRN
  Administered 2023-03-07: 5 mg via INTRAVENOUS
  Administered 2023-03-07 (×2): 10 mg via INTRAVENOUS

## 2023-03-07 MED ORDER — ACETAMINOPHEN 500 MG PO TABS: 1000.0000 mg | ORAL_TABLET | Freq: Once | ORAL | Status: DC

## 2023-03-07 SURGICAL SUPPLY — 75 items
ADH SKN CLS APL DERMABOND .7 (GAUZE/BANDAGES/DRESSINGS) ×2
APL PRP STRL LF DISP 70% ISPRP (MISCELLANEOUS) ×2
BAG COUNTER SPONGE SURGICOUNT (BAG) IMPLANT
BAG SPEC THK2 15X12 ZIP CLS (MISCELLANEOUS)
BAG SPNG CNTER NS LX DISP (BAG)
BAG ZIPLOCK 12X15 (MISCELLANEOUS) IMPLANT
BATTERY INSTRU NAVIGATION (MISCELLANEOUS) ×3 IMPLANT
BLADE SAW RECIPROCATING 77.5 (BLADE) ×1 IMPLANT
BNDG CMPR 5X4 KNIT ELC UNQ LF (GAUZE/BANDAGES/DRESSINGS) ×1
BNDG CMPR 6 X 5 YARDS HK CLSR (GAUZE/BANDAGES/DRESSINGS) ×1
BNDG ELASTIC 4INX 5YD STR LF (GAUZE/BANDAGES/DRESSINGS) ×1 IMPLANT
BNDG ELASTIC 6INX 5YD STR LF (GAUZE/BANDAGES/DRESSINGS) ×1 IMPLANT
BSPLAT TIB 5D G CMNT STM LT (Knees) ×1 IMPLANT
BTRY SRG DRVR LF (MISCELLANEOUS) ×3
CEMENT BONE REFOBACIN R1X40 US (Cement) IMPLANT
CHLORAPREP W/TINT 26 (MISCELLANEOUS) ×2 IMPLANT
COMP FEM CEMT PERSONA SZ9 LT (Knees) ×1 IMPLANT
COMPONENT FEM CEMT PRNSA SZ9LT (Knees) IMPLANT
COVER SURGICAL LIGHT HANDLE (MISCELLANEOUS) ×1 IMPLANT
DERMABOND ADVANCED .7 DNX12 (GAUZE/BANDAGES/DRESSINGS) ×2 IMPLANT
DRAPE SHEET LG 3/4 BI-LAMINATE (DRAPES) ×3 IMPLANT
DRAPE U-SHAPE 47X51 STRL (DRAPES) ×1 IMPLANT
DRSG AQUACEL AG ADV 3.5X10 (GAUZE/BANDAGES/DRESSINGS) ×1 IMPLANT
ELECT BLADE TIP CTD 4 INCH (ELECTRODE) ×1 IMPLANT
ELECT REM PT RETURN 15FT ADLT (MISCELLANEOUS) ×1 IMPLANT
GAUZE SPONGE 4X4 12PLY STRL (GAUZE/BANDAGES/DRESSINGS) ×1 IMPLANT
GLOVE BIO SURGEON STRL SZ7 (GLOVE) ×1 IMPLANT
GLOVE BIO SURGEON STRL SZ8.5 (GLOVE) ×2 IMPLANT
GLOVE BIOGEL PI IND STRL 7.5 (GLOVE) ×1 IMPLANT
GLOVE BIOGEL PI IND STRL 8.5 (GLOVE) ×1 IMPLANT
GOWN SPEC L3 XXLG W/TWL (GOWN DISPOSABLE) ×1 IMPLANT
GOWN STRL REUS W/ TWL XL LVL3 (GOWN DISPOSABLE) ×1 IMPLANT
GOWN STRL REUS W/TWL XL LVL3 (GOWN DISPOSABLE) ×1
HANDPIECE INTERPULSE COAX TIP (DISPOSABLE) ×1
HOLDER FOLEY CATH W/STRAP (MISCELLANEOUS) ×1 IMPLANT
HOOD PEEL AWAY T7 (MISCELLANEOUS) ×3 IMPLANT
KIT TURNOVER KIT A (KITS) IMPLANT
LINER TIB ASF PS GH/7-12 10 LT (Liner) IMPLANT
MARKER SKIN DUAL TIP RULER LAB (MISCELLANEOUS) ×1 IMPLANT
NDL SAFETY ECLIPSE 18X1.5 (NEEDLE) ×1 IMPLANT
NDL SPNL 18GX3.5 QUINCKE PK (NEEDLE) ×1 IMPLANT
NEEDLE SPNL 18GX3.5 QUINCKE PK (NEEDLE) ×1 IMPLANT
NS IRRIG 1000ML POUR BTL (IV SOLUTION) ×1 IMPLANT
PACK TOTAL KNEE CUSTOM (KITS) ×1 IMPLANT
PADDING CAST COTTON 6X4 STRL (CAST SUPPLIES) ×1 IMPLANT
PIN DRILL HDLS TROCAR 75 4PK (PIN) IMPLANT
PROTECTOR NERVE ULNAR (MISCELLANEOUS) ×1 IMPLANT
SAW OSC TIP CART 19.5X105X1.3 (SAW) ×1 IMPLANT
SCREW FEMALE HEX FIX 25X2.5 (ORTHOPEDIC DISPOSABLE SUPPLIES) IMPLANT
SEALER BIPOLAR AQUA 6.0 (INSTRUMENTS) ×1 IMPLANT
SET HNDPC FAN SPRY TIP SCT (DISPOSABLE) ×1 IMPLANT
SET PAD KNEE POSITIONER (MISCELLANEOUS) ×1 IMPLANT
SOLUTION PRONTOSAN WOUND 350ML (IRRIGATION / IRRIGATOR) IMPLANT
SPIKE FLUID TRANSFER (MISCELLANEOUS) ×2 IMPLANT
SPONGE T-LAP 18X18 ~~LOC~~+RFID (SPONGE) IMPLANT
STEM POLY PAT PLY 35M KNEE (Knees) IMPLANT
STEM TIB ST PERS 14+30 (Stem) IMPLANT
STEM TIBIA 5 DEG SZ G L KNEE (Knees) IMPLANT
SUT MNCRL AB 3-0 PS2 18 (SUTURE) ×1 IMPLANT
SUT MON AB 2-0 CT1 36 (SUTURE) ×1 IMPLANT
SUT STRATAFIX PDO 1 14 VIOLET (SUTURE) ×1
SUT STRATFX PDO 1 14 VIOLET (SUTURE) ×1
SUT VIC AB 1 CTX 36 (SUTURE) ×3
SUT VIC AB 1 CTX36XBRD ANBCTR (SUTURE) ×2 IMPLANT
SUT VIC AB 2-0 CT1 27 (SUTURE) ×1
SUT VIC AB 2-0 CT1 TAPERPNT 27 (SUTURE) ×1 IMPLANT
SUTURE STRATFX PDO 1 14 VIOLET (SUTURE) ×1 IMPLANT
SYR 3ML LL SCALE MARK (SYRINGE) ×1 IMPLANT
TIBIA STEM 5 DEG SZ G L KNEE (Knees) ×1 IMPLANT
TOWER CARTRIDGE SMART MIX (DISPOSABLE) IMPLANT
TRAY FOLEY MTR SLVR 14FR STAT (SET/KITS/TRAYS/PACK) IMPLANT
TRAY FOLEY MTR SLVR 16FR STAT (SET/KITS/TRAYS/PACK) IMPLANT
TUBE SUCTION HIGH CAP CLEAR NV (SUCTIONS) ×1 IMPLANT
WATER STERILE IRR 1000ML POUR (IV SOLUTION) ×2 IMPLANT
WRAP KNEE MAXI GEL POST OP (GAUZE/BANDAGES/DRESSINGS) IMPLANT

## 2023-03-07 NOTE — Anesthesia Procedure Notes (Signed)
Anesthesia Regional Block: Adductor canal block   Pre-Anesthetic Checklist: , timeout performed,  Correct Patient, Correct Site, Correct Laterality,  Correct Procedure, Correct Position, site marked,  Risks and benefits discussed,  Surgical consent,  Pre-op evaluation,  At surgeon's request and post-op pain management  Laterality: Left  Prep: chloraprep       Needles:  Injection technique: Single-shot  Needle Type: Echogenic Needle     Needle Length: 10cm  Needle Gauge: 21     Additional Needles:   Narrative:  Start time: 03/07/2023 8:06 AM End time: 03/07/2023 8:09 AM Injection made incrementally with aspirations every 5 mL.  Performed by: Personally  Anesthesiologist: Beryle Lathe, MD  Additional Notes: No pain on injection. No increased resistance to injection. Injection made in 5cc increments. Good needle visualization. Patient tolerated the procedure well.

## 2023-03-07 NOTE — Evaluation (Signed)
Physical Therapy Evaluation Patient Details Name: Kimberly Lyons MRN: [id number] DOB: [birth year] Today's Date: 03/07/2023  History of Present Illness  Pt is 62 yo female admitted 03/07/23 for L TKA.  Pt with hx including but not limited to CHF, arthritis, L hip fx with THA 08/12/22  Clinical Impression  Pt is s/p TKA resulting in the deficits listed below (see PT Problem List). At baseline, pt is independent and lives alone.  Her daughter will be staying with her at d/c and she has RW.  PT asked to see pt in PACU for possible same day d/c.  Pt was able to ambulate 80' and performe stairs similar to home set up.  She had good pain control, ROM, and quad activation. Pt demonstrates safe gait & transfers in order to return home from PT perspective once discharged by MD.  While in hospital, will continue to benefit from PT for skilled therapy to advance mobility and exercises.             If plan is discharge home, recommend the following: A little help with walking and/or transfers;A little help with bathing/dressing/bathroom;Assistance with cooking/housework;Help with stairs or ramp for entrance   Can travel by private vehicle        Equipment Recommendations None recommended by PT  Recommendations for Other Services       Functional Status Assessment Patient has had a recent decline in their functional status and demonstrates the ability to make significant improvements in function in a reasonable and predictable amount of time.     Precautions / Restrictions Precautions Precautions: Fall;Knee Restrictions Weight Bearing Restrictions: Yes LLE Weight Bearing: Weight bearing as tolerated      Mobility  Bed Mobility Overal bed mobility: Needs Assistance Bed Mobility: Supine to Sit     Supine to sit: Supervision          Transfers Overall transfer level: Needs assistance Equipment used: Rolling walker (2 wheels) Transfers: Sit to/from Stand Sit to Stand: Contact guard  assist           General transfer comment: CGA for safety    Ambulation/Gait Ambulation/Gait assistance: Contact guard assist Gait Distance (Feet): 80 Feet Assistive device: Rolling walker (2 wheels) Gait Pattern/deviations: Step-to pattern, Decreased weight shift to left, Antalgic Gait velocity: decreased     General Gait Details: antalgic but steady; cga safety  Stairs Stairs: Yes Stairs assistance: Contact guard assist Stair Management: Two rails, Step to pattern, Forwards Number of Stairs: 3 General stair comments: Pt aware of correct sequencing, tolerated well  Wheelchair Mobility     Tilt Bed    Modified Rankin (Stroke Patients Only)       Balance Overall balance assessment: Needs assistance Sitting-balance support: No upper extremity supported Sitting balance-Leahy Scale: Normal     Standing balance support: Bilateral upper extremity supported, No upper extremity supported Standing balance-Leahy Scale: Fair Standing balance comment: Steady wtih RW to ambulate; Static stand without UE support                             Pertinent Vitals/Pain Pain Assessment Pain Assessment: 0-10 Pain Score: 1  Pain Location: L knee Pain Descriptors / Indicators: Discomfort Pain Intervention(s): Premedicated before session, Limited activity within patient's tolerance, Monitored during session    Home Living Family/patient expects to be discharged to:: Private residence Living Arrangements: Alone Available Help at Discharge: Family;Available 24 hours/day (Dtr staying several days) Type of Home: House  Home Access: Stairs to enter Entrance Stairs-Rails: Right;Left Entrance Stairs-Number of Steps: 4 Alternate Level Stairs-Number of Steps: 14 Home Layout: Two level;Able to live on main level with bedroom/bathroom Home Equipment: Shower seat;BSC/3in1;Rollator (4 wheels);Rolling Walker (2 wheels);Crutches;Cane - single point      Prior Function Prior Level  of Function : Independent/Modified Independent             Mobility Comments: Could ambulate in community; was using cane ADLs Comments: independent with adls and iadls     Extremity/Trunk Assessment   Upper Extremity Assessment Upper Extremity Assessment: Overall WFL for tasks assessed    Lower Extremity Assessment Lower Extremity Assessment: LLE deficits/detail LLE Deficits / Details: Expected post op changes; ROM knee 0 to 60 degrees; MMT: ankle 5/5, knee and hip at least 3/5; can SLR wtihout lag    Cervical / Trunk Assessment Cervical / Trunk Assessment: Normal  Communication      Cognition Arousal: Alert Behavior During Therapy: WFL for tasks assessed/performed Overall Cognitive Status: Within Functional Limits for tasks assessed                                          General Comments  Educated on safe ice use, no pivots, car transfers, resting with leg straight, and TED hose during day. Also, encouraged walking every 1-2 hours during day. Educated on HEP with focus on mobility the first weeks. Discussed doing exercises within pain control and if pain increasing could decreased ROM, reps, and stop exercises as needed. Encouraged to perform quad sets and ankle pumps frequently for blood flow and to promote full knee extension.     Exercises Total Joint Exercises Ankle Circles/Pumps: AROM, 5 reps, Both, Supine Quad Sets: AROM, Left, 5 reps, Supine Heel Slides: AAROM, Left, 5 reps, Supine Hip ABduction/ADduction: AAROM, Left, 5 reps, Supine Long Arc Quad: AROM, Left, 5 reps, Seated Knee Flexion: AROM, Left, 5 reps, Seated Goniometric ROM: L knee 0 to 60   Assessment/Plan    PT Assessment Patient needs continued PT services  PT Problem List Decreased strength;Pain;Decreased range of motion;Decreased activity tolerance;Decreased balance;Decreased mobility;Decreased knowledge of use of DME       PT Treatment Interventions DME  instruction;Therapeutic exercise;Gait training;Stair training;Functional mobility training;Therapeutic activities;Patient/family education;Balance training    PT Goals (Current goals can be found in the Care Plan section)  Acute Rehab PT Goals Patient Stated Goal: return home PT Goal Formulation: With patient/family Time For Goal Achievement: 03/21/23 Potential to Achieve Goals: Good    Frequency 7X/week     Co-evaluation               AM-PAC PT "6 Clicks" Mobility  Outcome Measure Help needed turning from your back to your side while in a flat bed without using bedrails?: None Help needed moving from lying on your back to sitting on the side of a flat bed without using bedrails?: A Little Help needed moving to and from a bed to a chair (including a wheelchair)?: A Little Help needed standing up from a chair using your arms (e.g., wheelchair or bedside chair)?: A Little Help needed to walk in hospital room?: A Little Help needed climbing 3-5 steps with a railing? : A Little 6 Click Score: 19    End of Session Equipment Utilized During Treatment: Gait belt Activity Tolerance: Patient tolerated treatment well Patient left: in chair;with family/visitor present (in  pacu) Nurse Communication: Mobility status PT Visit Diagnosis: Other abnormalities of gait and mobility (R26.89)    Time: 6045-4098 PT Time Calculation (min) (ACUTE ONLY): 32 min   Charges:   PT Evaluation $PT Eval Low Complexity: 1 Low PT Treatments $Gait Training: 8-22 mins PT General Charges $$ ACUTE PT VISIT: 1 Visit         Anise Salvo, PT Acute Rehab Services [facility] [phone]   Rayetta Humphrey 03/07/2023, 3:08 PM

## 2023-03-07 NOTE — Transfer of Care (Signed)
Immediate Anesthesia Transfer of Care Note  Patient: Kimberly Lyons  Procedure(s) Performed: COMPUTER ASSISTED TOTAL KNEE ARTHROPLASTY (Left: Knee)  Patient Location: PACU  Anesthesia Type:Spinal  Level of Consciousness: awake and alert   Airway & Oxygen Therapy: Patient Spontanous Breathing  Post-op Assessment: Report given to RN and Post -op Vital signs reviewed and stable  Post vital signs: Reviewed and stable  Last Vitals:  Vitals Value Taken Time  BP 112/70 03/07/23 1149  Temp 97.1   Pulse 64 03/07/23 1151  Resp 13 03/07/23 1151  SpO2 96 % 03/07/23 1151  Vitals shown include unfiled device data.  Last Pain:  Vitals:   03/07/23 0644  TempSrc:   PainSc: 0-No pain         Complications: No notable events documented.

## 2023-03-07 NOTE — Op Note (Signed)
OPERATIVE REPORT  SURGEON: Samson Frederic, MD   ASSISTANT: Clint Bolder, PA-C.  PREOPERATIVE DIAGNOSIS: Left knee arthritis.   POSTOPERATIVE DIAGNOSIS: Left knee arthritis.   PROCEDURE: Left total knee arthroplasty.   IMPLANTS: Zimmer Persona CR femur, size 9. Persona cemented natural tibia, size G, with 14 x 30 mm stem extension. Vivacit-E polyethelyene insert, size 10 mm, CR. 3 button all poly patella, size 35 mm. Refobacin R bone cement.  ANESTHESIA:  MAC, Regional, and Spinal  TOURNIQUET TIME: Not utilized.  ESTIMATED BLOOD LOSS: -300 mL  ANTIBIOTICS: 2g Ancef.  DRAINS: Medium HV x1.  COMPLICATIONS: None   CONDITION: PACU - hemodynamically stable.   BRIEF CLINICAL NOTE: Kimberly Lyons is a 62 y.o. female with a long-standing history of Left knee arthritis. After failing conservative management, the patient was indicated for total knee arthroplasty. The risks, benefits, and alternatives to the procedure were explained, and the patient elected to proceed.  PROCEDURE IN DETAIL: Adductor canal block was obtained in the pre-op holding area. Once inside the operative room, spinal anesthesia was obtained, and a foley catheter was inserted. The patient was then positioned and the lower extremity was prepped and draped in the normal sterile surgical fashion.  A tourniquet was not utilized. A time-out was called verifying side and site of surgery. The patient received IV antibiotics within 60 minutes of beginning the procedure.    An anterior approach to the knee was performed utilizing a midvastus arthrotomy. A medial release was performed and the patellar fat pad was excised. Stryker imageless navigation was used to cut the distal femur perpendicular to the mechanical axis. A freehand patellar resection was performed, and the patella was sized an prepared with 3 lug holes.  Nagivation was used to make a neutral proximal tibia resection, taking 3 mm of bone from the less affected  medial side with 3 degrees of slope. The menisci were excised. A spacer block was placed, and the alignment and balance in extension were confirmed.   The distal femur was sized using the 3-degree external rotation guide referencing the posterior femoral cortex. The appropriate 4-in-1 cutting block was pinned into place. Rotation was checked using Whiteside's line, the epicondylar axis, and then confirmed with a spacer block in flexion. The remaining femoral cuts were performed, taking care to protect the MCL.  The tibia was sized and the trial tray was pinned into place. The remaining trail components were inserted. The knee was stable to varus and valgus stress through a full range of motion. The patella tracked centrally, and the PCL was well balanced. The trial components were removed, and the proximal tibial surface was prepared. Small drill holes were made in the sclerotic subchondral bone.The cut bony surfaces were irrigated with pulse lavage and dried with lap sponges. Final components were cemented into place and excess cement was cleared. The trial insert was placed, and the knee was brought into extension while the cement polymerized. Once the cement was hard, the knee was tested for a final time and found to be well balanced. The trial insert was exchanged for the real polyethylene insert.   The wound was copiously irrigated with Prontosan solution followed by normal saline with pulse lavage.  Marcaine solution was injected into the periarticular soft tissue.  The wound was closed in layers using #1 Vicryl and Stratafix for the fascia, 2-0 Vicryl for the subcutaneous fat, 2-0 Monocryl for the deep dermal layer, 3-0 running Monocryl subcuticular Stitch, 4-0 Monocryl tag sutures at either end  of the wound, and Dermabond for the skin.  Once the glue was fully dried, an Aquacell Ag and compressive dressing were applied.  The patient was awakened from anesthesia and transported to the recovery room in  stable condition.  Sponge, needle, and instrument counts were correct at the end of the case x2.  The patient tolerated the procedure well and there were no known complications.  Please note that a surgical assistant was a medical necessity for this procedure in order to perform it in a safe and expeditious manner. Surgical assistant was necessary to retract the ligaments and vital neurovascular structures to prevent injury to them and also necessary for proper positioning of the limb to allow for anatomic placement of the prosthesis.

## 2023-03-07 NOTE — Anesthesia Procedure Notes (Signed)
Spinal  Patient location during procedure: OR Start time: 03/07/2023 8:43 AM End time: 03/07/2023 8:47 AM Reason for block: surgical anesthesia Staffing Performed: anesthesiologist  Anesthesiologist: Beryle Lathe, MD Performed by: Beryle Lathe, MD Authorized by: Beryle Lathe, MD   Preanesthetic Checklist Completed: patient identified, IV checked, risks and benefits discussed, surgical consent, monitors and equipment checked, pre-op evaluation and timeout performed Spinal Block Patient position: sitting Prep: DuraPrep Patient monitoring: heart rate, cardiac monitor, continuous pulse ox and blood pressure Approach: midline Location: L3-4 Injection technique: single-shot Needle Needle type: Whitacre  Needle gauge: 22 G Needle length: 12.7 cm Additional Notes Consent was obtained prior to the procedure with all questions answered and concerns addressed. Risks including, but not limited to, bleeding, infection, nerve damage, paralysis, failed block, inadequate analgesia, allergic reaction, high spinal, itching, and headache were discussed and the patient wished to proceed. Functioning IV was confirmed and monitors were applied. Sterile prep and drape, including hand hygiene, mask, and sterile gloves were used. The patient was positioned and the spine was prepped. The skin was anesthetized with lidocaine. Free flow of clear CSF was obtained prior to injecting local anesthetic into the CSF. The spinal needle aspirated freely following injection. The needle was carefully withdrawn. The patient tolerated the procedure well.   Leslye Peer, MD

## 2023-03-07 NOTE — Discharge Instructions (Signed)

## 2023-03-07 NOTE — Interval H&P Note (Signed)
History and Physical Interval Note:  03/07/2023 7:51 AM  Kimberly Lyons  has presented today for surgery, with the diagnosis of Left knee osteoarthritis.  The various methods of treatment have been discussed with the patient and family. After consideration of risks, benefits and other options for treatment, the patient has consented to  Procedure(s) with comments: COMPUTER ASSISTED TOTAL KNEE ARTHROPLASTY (Left) - 160 as a surgical intervention.  The patient's history has been reviewed, patient examined, no change in status, stable for surgery.  I have reviewed the patient's chart and labs.  Questions were answered to the patient's satisfaction.     Kimberly Lyons

## 2023-03-07 NOTE — Anesthesia Postprocedure Evaluation (Signed)
Anesthesia Post Note  Patient: Kimberly Lyons  Procedure(s) Performed: COMPUTER ASSISTED TOTAL KNEE ARTHROPLASTY (Left: Knee)     Patient location during evaluation: PACU Anesthesia Type: Spinal Level of consciousness: awake and alert Pain management: pain level controlled Vital Signs Assessment: post-procedure vital signs reviewed and stable Respiratory status: spontaneous breathing and respiratory function stable Cardiovascular status: blood pressure returned to baseline and stable Postop Assessment: spinal receding and no apparent nausea or vomiting Anesthetic complications: no   No notable events documented.  Last Vitals:  Vitals:   03/07/23 1215 03/07/23 1245  BP: (!) 121/52 127/81  Pulse: (!) 31 65  Resp: 12 15  Temp:  (!) 36.2 C  SpO2: 98% 100%    Last Pain:  Vitals:   03/07/23 1215  TempSrc:   PainSc: 0-No pain                 Beryle Lathe

## 2023-03-07 NOTE — Anesthesia Procedure Notes (Signed)
Procedure Name: MAC Date/Time: 03/07/2023 8:50 AM  Performed by: Carloyn Manner, CRNAPre-anesthesia Checklist: Patient identified, Emergency Drugs available, Suction available, Patient being monitored and Timeout performed Patient Re-evaluated:Patient Re-evaluated prior to induction Oxygen Delivery Method: Simple face mask

## 2023-03-08 ENCOUNTER — Encounter (HOSPITAL_COMMUNITY): Payer: Self-pay | Admitting: Orthopedic Surgery

## 2023-05-18 NOTE — Progress Notes (Signed)
Sent message, via epic in basket, requesting orders in epic from surgeon.  

## 2023-05-21 ENCOUNTER — Ambulatory Visit: Payer: Self-pay | Admitting: Student

## 2023-05-21 NOTE — H&P (Signed)
TOTAL KNEE ADMISSION H&P  Patient is being admitted for right total knee arthroplasty.  Subjective:  Chief Complaint:right knee pain.  HPI: Kimberly Lyons, 63 y.o. female, has a history of pain and functional disability in the right knee due to arthritis and has failed non-surgical conservative treatments for greater than 12 weeks to includeNSAID's and/or analgesics, corticosteriod injections, flexibility and strengthening excercises, use of assistive devices, weight reduction as appropriate, and activity modification.  Onset of symptoms was gradual, starting 10 years ago with rapidlly worsening course since that time. The patient noted no past surgery on the right knee(s).  Patient currently rates pain in the right knee(s) at 10 out of 10 with activity. Patient has night pain, worsening of pain with activity and weight bearing, pain that interferes with activities of daily living, pain with passive range of motion, crepitus, and joint swelling.  Patient has evidence of subchondral cysts, subchondral sclerosis, periarticular osteophytes, and joint space narrowing by imaging studies. There is no active infection.  Patient Active Problem List   Diagnosis Date Noted   Osteoarthritis of left knee 03/07/2023   Hip fracture, left, closed, initial encounter (HCC) 08/10/2022   Chronic systolic HF (heart failure) (HCC) 07/16/2019   Hyperglycemia 02/12/2019   Morbid obesity (HCC) 02/12/2019   AKI (acute kidney injury) (HCC) 09/05/2018   Medication management 09/05/2018   Educated about COVID-19 virus infection 08/27/2018   Essential hypertension 08/27/2018   BMI 45.0-49.9, adult (HCC) 08/26/2018   Prediabetes 08/26/2018   Heart failure (HCC) 07/28/2018   Acute heart failure (HCC) 07/28/2018   Past Medical History:  Diagnosis Date   Acute systolic HF (heart failure) (HCC)    Arthritis    CHF (congestive heart failure) (HCC)    Heart disease    Hyperglycemia    Morbid obesity (HCC)     Pre-diabetes     Past Surgical History:  Procedure Laterality Date   KNEE ARTHROPLASTY Left 03/07/2023   Procedure: COMPUTER ASSISTED TOTAL KNEE ARTHROPLASTY;  Surgeon: Samson Frederic, MD;  Location: [facility];  Service: Orthopedics;  Laterality: Left;  160   TOTAL HIP ARTHROPLASTY Left 08/12/2022   Procedure: TOTAL HIP ARTHROPLASTY ANTERIOR APPROACH;  Surgeon: Samson Frederic, MD;  Location: [facility];  Service: Orthopedics;  Laterality: Left;    Current Outpatient Medications  Medication Sig Dispense Refill Last Dose/Taking   acetaminophen (TYLENOL) 500 MG tablet Take 1,000 mg by mouth every 6 (six) hours as needed for moderate pain (pain score 4-6).      carvedilol (COREG) 25 MG tablet TAKE 1 TABLET BY MOUTH TWICE A DAY 90 tablet 6    furosemide (LASIX) 20 MG tablet Take 20 mg by mouth daily as needed (fluid retention/high sodium food intake).      metFORMIN (GLUCOPHAGE) 500 MG tablet Take 1 tablet (500 mg total) by mouth 2 (two) times daily with a meal. 180 tablet 2    methocarbamol (ROBAXIN) 500 MG tablet Take 1 tablet (500 mg total) by mouth every 6 (six) hours as needed. (Patient not taking: Reported on 05/22/2023) 30 tablet 0    naproxen sodium (ALEVE) 220 MG tablet Take 220 mg by mouth 2 (two) times daily as needed (for pain).      ondansetron (ZOFRAN) 4 MG tablet Take 1 tablet (4 mg total) by mouth every 8 (eight) hours as needed for nausea or vomiting. 30 tablet 0    oxyCODONE (ROXICODONE) 5 MG immediate release tablet Take 1 tablet (5 mg total) by mouth every 4 (four)  hours as needed for severe pain (pain score 7-10). (Patient not taking: Reported on 05/22/2023) 42 tablet 0    sacubitril-valsartan (ENTRESTO) 97-103 MG Take 1 tablet by mouth 2 (two) times daily. 180 tablet 1    spironolactone (ALDACTONE) 25 MG tablet TAKE 1 TABLET (25 MG TOTAL) BY MOUTH DAILY. 90 tablet 1    No current facility-administered medications for this visit.   No Known Allergies  Social History   Tobacco Use    Smoking status: Never   Smokeless tobacco: Never  Substance Use Topics   Alcohol use: Never    Family History  Problem Relation Age of Onset   Hyperlipidemia Mother    Hypertension Mother    Heart attack Mother 69   Cancer Father        Prostate   Hypertension Father    Cancer Sister    Diabetes Sister    Hyperlipidemia Sister      Review of Systems  Musculoskeletal:  Positive for arthralgias, gait problem and joint swelling.  All other systems reviewed and are negative.   Objective:  Physical Exam Constitutional:      Appearance: Normal appearance.  HENT:     Head: Normocephalic and atraumatic.     Nose: Nose normal.     Mouth/Throat:     Mouth: Mucous membranes are moist.     Pharynx: Oropharynx is clear.  Eyes:     Conjunctiva/sclera: Conjunctivae normal.  Cardiovascular:     Rate and Rhythm: Normal rate and regular rhythm.     Pulses: Normal pulses.     Heart sounds: Normal heart sounds.  Pulmonary:     Effort: Pulmonary effort is normal.     Breath sounds: Normal breath sounds.  Abdominal:     General: Abdomen is flat.     Palpations: Abdomen is soft.  Genitourinary:    Comments: Deferred.  Musculoskeletal:     Cervical back: Normal range of motion and neck supple.     Comments: Examination of the right knee reveals no skin wounds or lesions. She has swelling, trace effusion. No warmth or erythema. Valgus deformity. Tenderness to palpation medial joint line, lateral joint line, and peripatellar retinacular tissues with a positive grind sign. Range of motion 20 to 120 degrees without any ligamentous instability. Painless range of motion the hip.  Sensory and motor function intact in LE bilaterally. Distal pedal pulses 2+ bilaterally.  No significant pedal edema. Calves soft and non-tender.  Skin:    General: Skin is warm and dry.     Capillary Refill: Capillary refill takes less than 2 seconds.  Neurological:     General: No focal deficit present.      Mental Status: She is alert and oriented to person, place, and time.  Psychiatric:        Mood and Affect: Mood normal.        Behavior: Behavior normal.        Thought Content: Thought content normal.        Judgment: Judgment normal.     Vital signs in last 24 hours: @VSRANGES @  Labs:   Estimated body mass index is 36.03 kg/m as calculated from the following:   Height as of 05/25/23: 5\' 9"  (1.753 m).   Weight as of 05/25/23: 110.7 kg.   Imaging Review Plain radiographs demonstrate severe degenerative joint disease of the right knee(s). The overall alignment issignificant valgus. The bone quality appears to be adequate for age and reported activity level.  Assessment/Plan:  End stage arthritis, right knee   The patient history, physical examination, clinical judgment of the provider and imaging studies are consistent with end stage degenerative joint disease of the right knee(s) and total knee arthroplasty is deemed medically necessary. The treatment options including medical management, injection therapy arthroscopy and arthroplasty were discussed at length. The risks and benefits of total knee arthroplasty were presented and reviewed. The risks due to aseptic loosening, infection, stiffness, patella tracking problems, thromboembolic complications and other imponderables were discussed. The patient acknowledged the explanation, agreed to proceed with the plan and consent was signed. Patient is being admitted for inpatient treatment for surgery, pain control, PT, OT, prophylactic antibiotics, VTE prophylaxis, progressive ambulation and ADL's and discharge planning. The patient is planning to be discharged home with OPPT.   Therapy Plans: outpatient therapy. PT at [facility] 06/11/23.  Disposition: Home with daughter Planned DVT Prophylaxis: aspirin 81mg  BID DME needed: Has rolling walker and cane. Will consider ice machine.  PCP: Cleared.  Cardiology: Cleared.  TXA:  IV Allergies: NDKA.  Anesthesia Concerns: None.  BMI: 39.9 Last HgbA1c: 6.1 Other: - Left THA 08/12/22.  - Left TKA 03/07/23.  - HFrEF, LVH with hypokinesis.  - Prediabetes, metformin.  - Oxycodone, zofran, methocarbamol.  [facility].  - 05/25/23: Hgb 13.0, K+ 4.4, Cr. 0.88.     Patient's anticipated LOS is less than 2 midnights, meeting these requirements: - Younger than 68 - Lives within 1 hour of care - Has a competent adult at home to recover with post-op recover - NO history of  - Chronic pain requiring opiods  - Diabetes  - Coronary Artery Disease  - Heart failure  - Heart attack  - Stroke  - DVT/VTE  - Cardiac arrhythmia  - Respiratory Failure/COPD  - Renal failure  - Anemia  - Advanced Liver disease

## 2023-05-24 NOTE — Patient Instructions (Signed)
SURGICAL WAITING ROOM VISITATION  Patients having surgery or a procedure may have no more than 2 support people in the waiting area - these visitors may rotate.    Children under the age of 73 must have an adult with them who is not the patient.  Due to an increase in RSV and influenza rates and associated hospitalizations, children ages 71 and under may not visit patients in [facility] hospitals.  Visitors with respiratory illnesses are discouraged from visiting and should remain at home.  If the patient needs to stay at the hospital during part of their recovery, the visitor guidelines for inpatient rooms apply. Pre-op nurse will coordinate an appropriate time for 1 support person to accompany patient in pre-op.  This support person may not rotate.    Please refer to the [facility] website for the visitor guidelines for Inpatients (after your surgery is over and you are in a regular room).       Your procedure is scheduled on: 06/06/23   Report to [facility] Main Entrance    Report to admitting at 6 AM   Call this number if you have problems the morning of surgery [phone]   Do not eat food :After Midnight.   After Midnight you may have the following liquids until 5:30 AM DAY OF SURGERY  Water Non-Citrus Juices (without pulp, NO RED-Apple, White grape, White cranberry) Black Coffee (NO MILK/CREAM OR CREAMERS, sugar ok)  Clear Tea (NO MILK/CREAM OR CREAMERS, sugar ok) regular and decaf                             Plain Jell-O (NO RED)                                           Fruit ices (not with fruit pulp, NO RED)                                     Popsicles (NO RED)                                                               Sports drinks like Gatorade (NO RED)                The day of surgery:  Drink ONE (1) Pre-Surgery G2 at 5:30 AM the morning of surgery. Drink in one sitting. Do not sip.  This drink was given to you during your hospital  pre-op  appointment visit. Nothing else to drink after completing the  Pre-Surgery G2.    Oral Hygiene is also important to reduce your risk of infection.                                    Remember - BRUSH YOUR TEETH THE MORNING OF SURGERY WITH YOUR REGULAR TOOTHPASTE  DENTURES WILL BE REMOVED PRIOR TO SURGERY PLEASE DO NOT APPLY "Poly grip" OR ADHESIVES!!!    Stop all vitamins and herbal supplements  7 days before surgery.   Take these medicines the morning of surgery with A SIP OF WATER: Carvedilol, Spironolactone              DO not take Lasix or Entresto the morning of surgery.  DO NOT TAKE ANY ORAL DIABETIC MEDICATIONS DAY OF YOUR SURGERY Do not take Metformin the morning of surgery.             You may not have any metal on your body including hair pins, jewelry, and body piercing             Do not wear make-up, lotions, powders, perfumes/cologne, or deodorant  Do not wear nail polish including gel and S&S, artificial/acrylic nails, or any other type of covering on natural nails including finger and toenails. If you have artificial nails, gel coating, etc. that needs to be removed by a nail salon please have this removed prior to surgery or surgery may need to be canceled/ delayed if the surgeon/ anesthesia feels like they are unable to be safely monitored.   Do not shave  48 hours prior to surgery.    Do not bring valuables to the hospital. [facility] IS NOT             RESPONSIBLE   FOR VALUABLES.   Contacts, glasses, dentures or bridgework may not be worn into surgery.  DO NOT BRING YOUR HOME MEDICATIONS TO THE HOSPITAL. PHARMACY WILL DISPENSE MEDICATIONS LISTED ON YOUR MEDICATION LIST TO YOU DURING YOUR ADMISSION IN THE HOSPITAL!    Patients discharged on the day of surgery will not be allowed to drive home.  Someone NEEDS to stay with you for the first 24 hours after anesthesia.   Special Instructions: Bring a copy of your healthcare power of attorney and living will  documents the day of surgery if you haven't scanned them before.              Please read over the following fact sheets you were given: IF YOU HAVE QUESTIONS ABOUT YOUR PRE-OP INSTRUCTIONS PLEASE CALL [phone] [facility]   If you received a COVID test during your pre-op visit  it is requested that you wear a mask when out in public, stay away from anyone that may not be feeling well and notify your surgeon if you develop symptoms. If you test positive for Covid or have been in contact with anyone that has tested positive in the last 10 days please notify you surgeon.      Pre-operative 5 CHG Bath Instructions   You can play a key role in reducing the risk of infection after surgery. Your skin needs to be as free of germs as possible. You can reduce the number of germs on your skin by washing with CHG (chlorhexidine gluconate) soap before surgery. CHG is an antiseptic soap that kills germs and continues to kill germs even after washing.   DO NOT use if you have an allergy to chlorhexidine/CHG or antibacterial soaps. If your skin becomes reddened or irritated, stop using the CHG and notify one of our RNs at [phone].   Please shower with the CHG soap starting 4 days before surgery using the following schedule:     Please keep in mind the following:  DO NOT shave, including legs and underarms, starting the day of your first shower.   You may shave your face at any point before/day of surgery.  Place clean sheets on your bed the day you start using  CHG soap. Use a clean washcloth (not used since being washed) for each shower. DO NOT sleep with pets once you start using the CHG.   CHG Shower Instructions:  If you choose to wash your hair and private area, wash first with your normal shampoo/soap.  After you use shampoo/soap, rinse your hair and body thoroughly to remove shampoo/soap residue.  Turn the water OFF and apply about 3 tablespoons (45 ml) of CHG soap to a CLEAN washcloth.   Apply CHG soap ONLY FROM YOUR NECK DOWN TO YOUR TOES (washing for 3-5 minutes)  DO NOT use CHG soap on face, private areas, open wounds, or sores.  Pay special attention to the area where your surgery is being performed.  If you are having back surgery, having someone wash your back for you may be helpful. Wait 2 minutes after CHG soap is applied, then you may rinse off the CHG soap.  Pat dry with a clean towel  Put on clean clothes/pajamas   If you choose to wear lotion, please use ONLY the CHG-compatible lotions on the back of this paper.     Additional instructions for the day of surgery: DO NOT APPLY any lotions, deodorants, cologne, or perfumes.   Put on clean/comfortable clothes.  Brush your teeth.  Ask your nurse before applying any prescription medications to the skin.      CHG Compatible Lotions   Aveeno Moisturizing lotion  Cetaphil Moisturizing Cream  Cetaphil Moisturizing Lotion  Clairol Herbal Essence Moisturizing Lotion, Dry Skin  Clairol Herbal Essence Moisturizing Lotion, Extra Dry Skin  Clairol Herbal Essence Moisturizing Lotion, Normal Skin  Curel Age Defying Therapeutic Moisturizing Lotion with Alpha Hydroxy  Curel Extreme Care Body Lotion  Curel Soothing Hands Moisturizing Hand Lotion  Curel Therapeutic Moisturizing Cream, Fragrance-Free  Curel Therapeutic Moisturizing Lotion, Fragrance-Free  Curel Therapeutic Moisturizing Lotion, Original Formula  Eucerin Daily Replenishing Lotion  Eucerin Dry Skin Therapy Plus Alpha Hydroxy Crme  Eucerin Dry Skin Therapy Plus Alpha Hydroxy Lotion  Eucerin Original Crme  Eucerin Original Lotion  Eucerin Plus Crme Eucerin Plus Lotion  Eucerin TriLipid Replenishing Lotion  Keri Anti-Bacterial Hand Lotion  Keri Deep Conditioning Original Lotion Dry Skin Formula Softly Scented  Keri Deep Conditioning Original Lotion, Fragrance Free Sensitive Skin Formula  Keri Lotion Fast Absorbing Fragrance Free Sensitive Skin  Formula  Keri Lotion Fast Absorbing Softly Scented Dry Skin Formula  Keri Original Lotion  Keri Skin Renewal Lotion Keri Silky Smooth Lotion  Keri Silky Smooth Sensitive Skin Lotion  Nivea Body Creamy Conditioning Oil  Nivea Body Extra Enriched Lotion  Nivea Body Original Lotion  Nivea Body Sheer Moisturizing Lotion Nivea Crme  Nivea Skin Firming Lotion  NutraDerm 30 Skin Lotion  NutraDerm Skin Lotion  NutraDerm Therapeutic Skin Cream  NutraDerm Therapeutic Skin Lotion  ProShield Protective Hand Cream   Incentive Spirometer (Watch this video at home: [email or web address])  An incentive spirometer is a tool that can help keep your lungs clear and active. This tool measures how well you are filling your lungs with each breath. Taking long deep breaths may help reverse or decrease the chance of developing breathing (pulmonary) problems (especially infection) following: A long period of time when you are unable to move or be active. BEFORE THE PROCEDURE  If the spirometer includes an indicator to show your best effort, your nurse or respiratory therapist will set it to a desired goal. If possible, sit up straight or lean slightly forward. Try not to slouch. Hold the incentive  spirometer in an upright position. INSTRUCTIONS FOR USE  Sit on the edge of your bed if possible, or sit up as far as you can in bed or on a chair. Hold the incentive spirometer in an upright position. Breathe out normally. Place the mouthpiece in your mouth and seal your lips tightly around it. Breathe in slowly and as deeply as possible, raising the piston or the ball toward the top of the column. Hold your breath for 3-5 seconds or for as long as possible. Allow the piston or ball to fall to the bottom of the column. Remove the mouthpiece from your mouth and breathe out normally. Rest for a few seconds and repeat Steps 1 through 7 at least 10 times every 1-2 hours when you are awake. Take  your time and take a few normal breaths between deep breaths. The spirometer may include an indicator to show your best effort. Use the indicator as a goal to work toward during each repetition. After each set of 10 deep breaths, practice coughing to be sure your lungs are clear. If you have an incision (the cut made at the time of surgery), support your incision when coughing by placing a pillow or rolled up towels firmly against it. Once you are able to get out of bed, walk around indoors and cough well. You may stop using the incentive spirometer when instructed by your caregiver.  RISKS AND COMPLICATIONS Take your time so you do not get dizzy or light-headed. If you are in pain, you may need to take or ask for pain medication before doing incentive spirometry. It is harder to take a deep breath if you are having pain. AFTER USE Rest and breathe slowly and easily. It can be helpful to keep track of a log of your progress. Your caregiver can provide you with a simple table to help with this. If you are using the spirometer at home, follow these instructions: SEEK MEDICAL CARE IF:  You are having difficultly using the spirometer. You have trouble using the spirometer as often as instructed. Your pain medication is not giving enough relief while using the spirometer. You develop fever of 100.5 F (38.1 C) or higher. SEEK IMMEDIATE MEDICAL CARE IF:  You cough up bloody sputum that had not been present before. You develop fever of 102 F (38.9 C) or greater. You develop worsening pain at or near the incision site. MAKE SURE YOU:  Understand these instructions. Will watch your condition. Will get help right away if you are not doing well or get worse. Document Released: 08/28/2006 Document Revised: 07/10/2011 Document Reviewed: 10/29/2006 [facility] Patient Information 2014 [place], [place].

## 2023-05-24 NOTE — Progress Notes (Addendum)
COVID Vaccine received:  []  No [x]  Yes Date of any COVID positive Test in last 90 days: no PCP - Nira Conn MD Cardiologist - Rollene Rotunda MD  Chest x-ray - 08/10/22 Epic EKG -  08/10/22 Epic Stress Test - 01/02/19 Epic ECHO - 05/07/20 Epic Cardiac Cath -   Bowel Prep - [x]  No  []   Yes ______  Pacemaker / ICD device [x]  No []  Yes   Spinal Cord Stimulator:[x]  No []  Yes       History of Sleep Apnea? [x]  No []  Yes   CPAP used?- [x]  No []  Yes    Does the patient monitor blood sugar?          [x]  No []  Yes  []  N/A  Patient has: [x]  NO Hx DM   []  Pre-DM                 []  DM1  []   DM2 Does patient have a [employer] or Dexacom? []  No []  Yes   Fasting Blood Sugar Ranges-  Checks Blood Sugar _____ times a day  GLP1 agonist / usual dose - no GLP1 instructions:  SGLT-2 inhibitors / usual dose -no SGLT-2 instructions:   Blood Thinner / Instructions:no Aspirin Instructions:no  Comments:   Activity level: Patient is able  to climb a flight of stairs without difficulty; [x]  No CP  [x]  No SOB,  ___   Patient can  perform ADLs without assistance.   Anesthesia review: CAD,HTN, CHF  Patient denies shortness of breath, fever, cough and chest pain at PAT appointment.  Patient verbalized understanding and agreement to the Pre-Surgical Instructions that were given to them at this PAT appointment. Patient was also educated of the need to review these PAT instructions again prior to his/her surgery.I reviewed the appropriate phone numbers to call if they have any and questions or concerns.

## 2023-05-25 ENCOUNTER — Other Ambulatory Visit: Payer: Self-pay

## 2023-05-25 ENCOUNTER — Encounter (HOSPITAL_COMMUNITY)
Admission: RE | Admit: 2023-05-25 | Discharge: 2023-05-25 | Disposition: A | Discharge status: Home / Self Care | Payer: 59 | Source: Ambulatory Visit | Attending: Orthopedic Surgery | Admitting: Orthopedic Surgery

## 2023-05-25 VITALS — BP 131/78 | HR 69 | Temp 97.9°F | Resp 16 | Ht 69.0 in | Wt 244.0 lb

## 2023-05-25 DIAGNOSIS — Z01812 Encounter for preprocedural laboratory examination: Secondary | ICD-10-CM | POA: Insufficient documentation

## 2023-05-25 DIAGNOSIS — I10 Essential (primary) hypertension: Secondary | ICD-10-CM | POA: Diagnosis not present

## 2023-05-25 DIAGNOSIS — Z01818 Encounter for other preprocedural examination: Secondary | ICD-10-CM

## 2023-05-25 LAB — BASIC METABOLIC PANEL
Anion gap: 11 (ref 5–15)
BUN: 22 mg/dL (ref 8–23)
CO2: 23 mmol/L (ref 22–32)
Calcium: 9.3 mg/dL (ref 8.9–10.3)
Chloride: 103 mmol/L (ref 98–111)
Creatinine, Ser: 0.88 mg/dL (ref 0.44–1.00)
GFR, Estimated: >60 mL/min (ref >60)
Glucose, Bld: 110 mg/dL — ABNORMAL HIGH (ref 70–99)
Potassium: 4.4 mmol/L (ref 3.5–5.1)
Sodium: 137 mmol/L (ref 135–145)

## 2023-05-25 LAB — SURGICAL PCR SCREEN
MRSA, PCR: NEGATIVE
Staphylococcus aureus: NEGATIVE

## 2023-05-25 LAB — CBC
HCT: 40.9 % (ref 36.0–46.0)
Hemoglobin: 13 g/dL (ref 12.0–15.0)
MCH: 29.3 pg (ref 26.0–34.0)
MCHC: 31.8 g/dL (ref 30.0–36.0)
MCV: 92.3 fL (ref 80.0–100.0)
Platelets: 434 10*3/uL — ABNORMAL HIGH (ref 150–400)
RBC: 4.43 MIL/uL (ref 3.87–5.11)
RDW: 12.8 % (ref 11.5–15.5)
WBC: 10.2 10*3/uL (ref 4.0–10.5)
nRBC: 0 % (ref 0.0–0.2)

## 2023-06-06 ENCOUNTER — Ambulatory Visit (HOSPITAL_COMMUNITY)
Admission: RE | Admit: 2023-06-06 | Discharge: 2023-06-06 | Disposition: A | Discharge status: Home / Self Care | Payer: 59 | Attending: Orthopedic Surgery | Admitting: Orthopedic Surgery

## 2023-06-06 ENCOUNTER — Other Ambulatory Visit: Payer: Self-pay

## 2023-06-06 ENCOUNTER — Encounter (HOSPITAL_COMMUNITY)
Admission: RE | Disposition: A | Discharge status: Home / Self Care | Payer: Self-pay | Source: Home / Self Care | Attending: Orthopedic Surgery

## 2023-06-06 ENCOUNTER — Ambulatory Visit (HOSPITAL_COMMUNITY): Payer: 59 | Admitting: Physician Assistant

## 2023-06-06 ENCOUNTER — Ambulatory Visit (HOSPITAL_COMMUNITY): Payer: 59 | Admitting: Anesthesiology

## 2023-06-06 ENCOUNTER — Ambulatory Visit (HOSPITAL_COMMUNITY): Payer: 59

## 2023-06-06 ENCOUNTER — Encounter (HOSPITAL_COMMUNITY): Payer: Self-pay | Admitting: Orthopedic Surgery

## 2023-06-06 ENCOUNTER — Other Ambulatory Visit (HOSPITAL_COMMUNITY): Payer: Self-pay

## 2023-06-06 DIAGNOSIS — M170 Bilateral primary osteoarthritis of knee: Secondary | ICD-10-CM | POA: Insufficient documentation

## 2023-06-06 DIAGNOSIS — R7303 Prediabetes: Secondary | ICD-10-CM | POA: Diagnosis not present

## 2023-06-06 DIAGNOSIS — I110 Hypertensive heart disease with heart failure: Secondary | ICD-10-CM | POA: Diagnosis not present

## 2023-06-06 DIAGNOSIS — Z6836 Body mass index (BMI) 36.0-36.9, adult: Secondary | ICD-10-CM | POA: Insufficient documentation

## 2023-06-06 DIAGNOSIS — M1711 Unilateral primary osteoarthritis, right knee: Secondary | ICD-10-CM | POA: Diagnosis present

## 2023-06-06 DIAGNOSIS — I5022 Chronic systolic (congestive) heart failure: Secondary | ICD-10-CM | POA: Insufficient documentation

## 2023-06-06 DIAGNOSIS — Z7984 Long term (current) use of oral hypoglycemic drugs: Secondary | ICD-10-CM | POA: Diagnosis not present

## 2023-06-06 DIAGNOSIS — Z96652 Presence of left artificial knee joint: Secondary | ICD-10-CM | POA: Diagnosis not present

## 2023-06-06 HISTORY — PX: KNEE ARTHROPLASTY: SHX992

## 2023-06-06 LAB — GLUCOSE, CAPILLARY
Glucose-Capillary: 125 mg/dL — ABNORMAL HIGH (ref 70–99)
Glucose-Capillary: 137 mg/dL — ABNORMAL HIGH (ref 70–99)

## 2023-06-06 SURGERY — ARTHROPLASTY, KNEE, TOTAL, USING IMAGELESS COMPUTER-ASSISTED NAVIGATION
Anesthesia: Spinal | Site: Knee | Laterality: Right

## 2023-06-06 MED ORDER — OXYCODONE HCL 5 MG PO TABS
5.0000 mg | ORAL_TABLET | ORAL | Status: DC | PRN
Start: 2023-06-06 — End: 2023-06-06
  Administered 2023-06-06: 5 mg via ORAL

## 2023-06-06 MED ORDER — HYDROMORPHONE HCL 1 MG/ML IJ SOLN: 0.5000 mg | INTRAMUSCULAR | Status: DC | PRN

## 2023-06-06 MED ORDER — LACTATED RINGERS IV SOLN: INTRAVENOUS | Status: DC

## 2023-06-06 MED ORDER — POLYETHYLENE GLYCOL 3350 17 GM/SCOOP PO POWD
17.0000 g | Freq: Every day | ORAL | 0 refills | Status: AC | PRN
  Filled 2023-06-06: qty 238, 14d supply, fill #0

## 2023-06-06 MED ORDER — GLYCOPYRROLATE 0.2 MG/ML IJ SOLN
INTRAMUSCULAR | Status: DC | PRN
  Administered 2023-06-06 (×3): .1 mg via INTRAVENOUS

## 2023-06-06 MED ORDER — HYDROMORPHONE HCL 1 MG/ML IJ SOLN
0.2500 mg | INTRAMUSCULAR | Status: DC | PRN
  Administered 2023-06-06 (×2): 0.5 mg via INTRAVENOUS

## 2023-06-06 MED ORDER — SENNA 8.6 MG PO TABS
2.0000 | ORAL_TABLET | Freq: Every day | ORAL | 0 refills | Status: AC
  Filled 2023-06-06: qty 30, 15d supply, fill #0

## 2023-06-06 MED ORDER — DROPERIDOL 2.5 MG/ML IJ SOLN: 0.6250 mg | Freq: Once | INTRAMUSCULAR | Status: DC | PRN

## 2023-06-06 MED ORDER — BUPIVACAINE IN DEXTROSE 0.75-8.25 % IT SOLN
INTRATHECAL | Status: DC | PRN
  Administered 2023-06-06: 2 mL via INTRATHECAL

## 2023-06-06 MED ORDER — KETOROLAC TROMETHAMINE 15 MG/ML IJ SOLN
INTRAMUSCULAR | Status: AC
  Filled 2023-06-06: qty 1

## 2023-06-06 MED ORDER — MIDAZOLAM HCL 2 MG/2ML IJ SOLN
INTRAMUSCULAR | Status: AC
Start: 2023-06-06 — End: ?
  Filled 2023-06-06: qty 2

## 2023-06-06 MED ORDER — BUPIVACAINE-EPINEPHRINE 0.25% -1:200000 IJ SOLN
INTRAMUSCULAR | Status: AC
  Filled 2023-06-06: qty 1

## 2023-06-06 MED ORDER — GLYCOPYRROLATE 0.2 MG/ML IJ SOLN
INTRAMUSCULAR | Status: AC
  Filled 2023-06-06: qty 1

## 2023-06-06 MED ORDER — 0.9 % SODIUM CHLORIDE (POUR BTL) OPTIME
TOPICAL | Status: DC | PRN
  Administered 2023-06-06: 1000 mL

## 2023-06-06 MED ORDER — PHENYLEPHRINE HCL-NACL 20-0.9 MG/250ML-% IV SOLN
INTRAVENOUS | Status: AC
  Filled 2023-06-06: qty 500

## 2023-06-06 MED ORDER — TRANEXAMIC ACID-NACL 1000-0.7 MG/100ML-% IV SOLN
1000.0000 mg | INTRAVENOUS | Status: AC
  Administered 2023-06-06: 1000 mg via INTRAVENOUS
  Filled 2023-06-06: qty 100

## 2023-06-06 MED ORDER — ISOPROPYL ALCOHOL 70 % SOLN
Status: DC | PRN
  Administered 2023-06-06: 1 via TOPICAL

## 2023-06-06 MED ORDER — INSULIN ASPART 100 UNIT/ML IJ SOLN: 0.0000 [IU] | INTRAMUSCULAR | Status: DC | PRN

## 2023-06-06 MED ORDER — PROPOFOL 500 MG/50ML IV EMUL
INTRAVENOUS | Status: DC | PRN
  Administered 2023-06-06: 80 ug/kg/min via INTRAVENOUS
  Administered 2023-06-06: 40 mg via INTRAVENOUS

## 2023-06-06 MED ORDER — METOCLOPRAMIDE HCL 5 MG/ML IJ SOLN: 5.0000 mg | Freq: Three times a day (TID) | INTRAMUSCULAR | Status: DC | PRN

## 2023-06-06 MED ORDER — CEFAZOLIN SODIUM-DEXTROSE 2-4 GM/100ML-% IV SOLN: 2.0000 g | Freq: Four times a day (QID) | INTRAVENOUS | Status: DC

## 2023-06-06 MED ORDER — DOCUSATE SODIUM 100 MG PO CAPS
100.0000 mg | ORAL_CAPSULE | Freq: Two times a day (BID) | ORAL | 0 refills | Status: AC
  Filled 2023-06-06: qty 60, 30d supply, fill #0

## 2023-06-06 MED ORDER — ALBUMIN HUMAN 5 % IV SOLN: INTRAVENOUS | Status: DC | PRN

## 2023-06-06 MED ORDER — ORAL CARE MOUTH RINSE: 15.0000 mL | Freq: Once | OROMUCOSAL | Status: AC

## 2023-06-06 MED ORDER — CLONIDINE HCL (ANALGESIA) 100 MCG/ML EP SOLN
EPIDURAL | Status: DC | PRN
  Administered 2023-06-06: 100 ug

## 2023-06-06 MED ORDER — SODIUM CHLORIDE 0.9 % IR SOLN
Status: DC | PRN
  Administered 2023-06-06: 500 mL
  Administered 2023-06-06: 1000 mL

## 2023-06-06 MED ORDER — ACETAMINOPHEN 10 MG/ML IV SOLN: 1000.0000 mg | Freq: Once | INTRAVENOUS | Status: DC | PRN

## 2023-06-06 MED ORDER — POVIDONE-IODINE 10 % EX SWAB: 2.0000 | Freq: Once | CUTANEOUS | Status: DC

## 2023-06-06 MED ORDER — FENTANYL CITRATE (PF) 100 MCG/2ML IJ SOLN
INTRAMUSCULAR | Status: DC | PRN
  Administered 2023-06-06: 25 ug via INTRAVENOUS
  Administered 2023-06-06: 12.5 ug via INTRAVENOUS
  Administered 2023-06-06 (×2): 25 ug via INTRAVENOUS
  Administered 2023-06-06: 12.5 ug via INTRAVENOUS

## 2023-06-06 MED ORDER — METOCLOPRAMIDE HCL 5 MG PO TABS
5.0000 mg | ORAL_TABLET | Freq: Three times a day (TID) | ORAL | Status: DC | PRN
Start: 2023-06-06 — End: 2023-06-06

## 2023-06-06 MED ORDER — PHENYLEPHRINE HCL-NACL 20-0.9 MG/250ML-% IV SOLN
INTRAVENOUS | Status: DC | PRN
  Administered 2023-06-06: 25 ug/min via INTRAVENOUS

## 2023-06-06 MED ORDER — HYDROMORPHONE HCL 1 MG/ML IJ SOLN
INTRAMUSCULAR | Status: AC
  Filled 2023-06-06: qty 1

## 2023-06-06 MED ORDER — OXYCODONE HCL 5 MG PO TABS
5.0000 mg | ORAL_TABLET | ORAL | 0 refills | Status: DC | PRN
  Filled 2023-06-06: qty 42, 7d supply, fill #0

## 2023-06-06 MED ORDER — OXYCODONE HCL 5 MG PO TABS: 10.0000 mg | ORAL_TABLET | ORAL | Status: DC | PRN

## 2023-06-06 MED ORDER — STERILE WATER FOR IRRIGATION IR SOLN
Status: DC | PRN
  Administered 2023-06-06: 1000 mL

## 2023-06-06 MED ORDER — CEFAZOLIN SODIUM-DEXTROSE 2-3 GM-%(50ML) IV SOLR
INTRAVENOUS | Status: DC | PRN
  Administered 2023-06-06: 2 g via INTRAVENOUS

## 2023-06-06 MED ORDER — ACETAMINOPHEN 325 MG PO TABS
325.0000 mg | ORAL_TABLET | Freq: Four times a day (QID) | ORAL | Status: DC | PRN
Start: 2023-06-07 — End: 2023-06-06

## 2023-06-06 MED ORDER — KETOROLAC TROMETHAMINE 15 MG/ML IJ SOLN
15.0000 mg | Freq: Four times a day (QID) | INTRAMUSCULAR | Status: DC
  Administered 2023-06-06: 15 mg via INTRAVENOUS

## 2023-06-06 MED ORDER — KETOROLAC TROMETHAMINE 30 MG/ML IJ SOLN
INTRAMUSCULAR | Status: AC
  Filled 2023-06-06: qty 1

## 2023-06-06 MED ORDER — PROPOFOL 500 MG/50ML IV EMUL
INTRAVENOUS | Status: AC
  Filled 2023-06-06: qty 50

## 2023-06-06 MED ORDER — ASPIRIN 81 MG PO CHEW
81.0000 mg | CHEWABLE_TABLET | Freq: Two times a day (BID) | ORAL | 0 refills | Status: DC
  Filled 2023-06-06: qty 90, 45d supply, fill #0

## 2023-06-06 MED ORDER — LIDOCAINE HCL (PF) 2 % IJ SOLN
INTRAMUSCULAR | Status: AC
  Filled 2023-06-06: qty 5

## 2023-06-06 MED ORDER — METHOCARBAMOL 500 MG PO TABS
ORAL_TABLET | ORAL | Status: AC
  Filled 2023-06-06: qty 1

## 2023-06-06 MED ORDER — OXYCODONE HCL 5 MG PO TABS
ORAL_TABLET | ORAL | Status: AC
  Filled 2023-06-06: qty 1

## 2023-06-06 MED ORDER — SODIUM CHLORIDE (PF) 0.9 % IJ SOLN
INTRAMUSCULAR | Status: AC
  Filled 2023-06-06: qty 30

## 2023-06-06 MED ORDER — LACTATED RINGERS IV BOLUS
250.0000 mL | Freq: Once | INTRAVENOUS | Status: DC
Start: 2023-06-06 — End: 2023-06-06

## 2023-06-06 MED ORDER — SODIUM CHLORIDE (PF) 0.9 % IJ SOLN
INTRAMUSCULAR | Status: DC | PRN
  Administered 2023-06-06: 61 mL

## 2023-06-06 MED ORDER — PROPOFOL 1000 MG/100ML IV EMUL
INTRAVENOUS | Status: AC
Start: 2023-06-06 — End: ?
  Filled 2023-06-06: qty 100

## 2023-06-06 MED ORDER — ONDANSETRON HCL 4 MG PO TABS: 4.0000 mg | ORAL_TABLET | Freq: Four times a day (QID) | ORAL | Status: DC | PRN

## 2023-06-06 MED ORDER — ACETAMINOPHEN 500 MG PO TABS
1000.0000 mg | ORAL_TABLET | Freq: Four times a day (QID) | ORAL | Status: DC
  Administered 2023-06-06: 1000 mg via ORAL

## 2023-06-06 MED ORDER — ACETAMINOPHEN 500 MG PO TABS
1000.0000 mg | ORAL_TABLET | Freq: Once | ORAL | Status: AC
Start: 2023-06-06 — End: 2023-06-06
  Filled 2023-06-06: qty 2

## 2023-06-06 MED ORDER — CHLORHEXIDINE GLUCONATE 0.12 % MT SOLN
15.0000 mL | Freq: Once | OROMUCOSAL | Status: AC
  Administered 2023-06-06: 15 mL via OROMUCOSAL

## 2023-06-06 MED ORDER — FENTANYL CITRATE (PF) 100 MCG/2ML IJ SOLN
INTRAMUSCULAR | Status: AC
  Filled 2023-06-06: qty 2

## 2023-06-06 MED ORDER — METHOCARBAMOL 1000 MG/10ML IJ SOLN: 500.0000 mg | Freq: Four times a day (QID) | INTRAMUSCULAR | Status: DC | PRN

## 2023-06-06 MED ORDER — LACTATED RINGERS IV BOLUS: 500.0000 mL | Freq: Once | INTRAVENOUS | Status: DC

## 2023-06-06 MED ORDER — ALBUMIN HUMAN 5 % IV SOLN
INTRAVENOUS | Status: AC
Start: 2023-06-06 — End: ?
  Filled 2023-06-06: qty 250

## 2023-06-06 MED ORDER — MIDAZOLAM HCL 2 MG/2ML IJ SOLN
INTRAMUSCULAR | Status: DC | PRN
  Administered 2023-06-06 (×2): 1 mg via INTRAVENOUS

## 2023-06-06 MED ORDER — ONDANSETRON HCL 4 MG/2ML IJ SOLN
4.0000 mg | Freq: Four times a day (QID) | INTRAMUSCULAR | Status: DC | PRN
Start: 2023-06-06 — End: 2023-06-06

## 2023-06-06 MED ORDER — BUPIVACAINE-EPINEPHRINE (PF) 0.5% -1:200000 IJ SOLN
INTRAMUSCULAR | Status: DC | PRN
  Administered 2023-06-06: 15 mL via PERINEURAL

## 2023-06-06 MED ORDER — ONDANSETRON HCL 4 MG PO TABS
4.0000 mg | ORAL_TABLET | Freq: Three times a day (TID) | ORAL | 0 refills | Status: DC | PRN
  Filled 2023-06-06: qty 18, 21d supply, fill #0

## 2023-06-06 MED ORDER — ACETAMINOPHEN 500 MG PO TABS
1000.0000 mg | ORAL_TABLET | Freq: Once | ORAL | Status: AC
  Administered 2023-06-06: 1000 mg via ORAL

## 2023-06-06 MED ORDER — ACETAMINOPHEN 500 MG PO TABS
ORAL_TABLET | ORAL | Status: AC
  Filled 2023-06-06: qty 2

## 2023-06-06 MED ORDER — METHOCARBAMOL 500 MG PO TABS
500.0000 mg | ORAL_TABLET | Freq: Four times a day (QID) | ORAL | Status: DC | PRN
  Administered 2023-06-06: 500 mg via ORAL

## 2023-06-06 MED ORDER — CEFAZOLIN SODIUM-DEXTROSE 2-4 GM/100ML-% IV SOLN
2.0000 g | INTRAVENOUS | Status: DC
  Filled 2023-06-06: qty 100

## 2023-06-06 MED ORDER — SODIUM CHLORIDE 0.9 % IV SOLN: INTRAVENOUS | Status: DC | PRN

## 2023-06-06 SURGICAL SUPPLY — 62 items
BAG COUNTER SPONGE SURGICOUNT (BAG) IMPLANT
BAG ZIPLOCK 12X15 (MISCELLANEOUS) IMPLANT
BATTERY INSTRU NAVIGATION (MISCELLANEOUS) ×3 IMPLANT
BLADE SAW RECIPROCATING 77.5 (BLADE) ×1 IMPLANT
BNDG ELASTIC 4INX 5YD STR LF (GAUZE/BANDAGES/DRESSINGS) ×1 IMPLANT
BNDG ELASTIC 6INX 5YD STR LF (GAUZE/BANDAGES/DRESSINGS) ×1 IMPLANT
BOWL SMART MIX CTS (DISPOSABLE) IMPLANT
CEMENT BONE REFOBACIN R1X40 US (Cement) IMPLANT
CHLORAPREP W/TINT 26 (MISCELLANEOUS) ×2 IMPLANT
COMP FEM PS KNEE STD 10 RT (Joint) ×1 IMPLANT
COMP PATELLA 3 PEG 35 (Joint) ×1 IMPLANT
COMPONENT FEM PS KN STD 10 RT (Joint) IMPLANT
COMPONENT PATELLA 3 PEG 35 (Joint) IMPLANT
COVER SURGICAL LIGHT HANDLE (MISCELLANEOUS) ×1 IMPLANT
DERMABOND ADVANCED .7 DNX12 (GAUZE/BANDAGES/DRESSINGS) ×2 IMPLANT
DRAPE SHEET LG 3/4 BI-LAMINATE (DRAPES) ×3 IMPLANT
DRAPE U-SHAPE 47X51 STRL (DRAPES) ×1 IMPLANT
DRSG AQUACEL AG ADV 3.5X10 (GAUZE/BANDAGES/DRESSINGS) ×1 IMPLANT
ELECT BLADE TIP CTD 4 INCH (ELECTRODE) ×1 IMPLANT
ELECT REM PT RETURN 15FT ADLT (MISCELLANEOUS) ×1 IMPLANT
GAUZE SPONGE 4X4 12PLY STRL (GAUZE/BANDAGES/DRESSINGS) ×1 IMPLANT
GLOVE BIO SURGEON STRL SZ7 (GLOVE) ×1 IMPLANT
GLOVE BIO SURGEON STRL SZ8.5 (GLOVE) ×2 IMPLANT
GLOVE BIOGEL PI IND STRL 7.5 (GLOVE) ×1 IMPLANT
GLOVE BIOGEL PI IND STRL 8.5 (GLOVE) ×1 IMPLANT
GOWN SPEC L3 XXLG W/TWL (GOWN DISPOSABLE) ×1 IMPLANT
GOWN STRL REUS W/ TWL XL LVL3 (GOWN DISPOSABLE) ×1 IMPLANT
HOLDER FOLEY CATH W/STRAP (MISCELLANEOUS) ×1 IMPLANT
HOOD PEEL AWAY T7 (MISCELLANEOUS) ×3 IMPLANT
INSERT TIB ASF PS 7-12 10 GH (Insert) IMPLANT
KIT TURNOVER KIT A (KITS) IMPLANT
MARKER SKIN DUAL TIP RULER LAB (MISCELLANEOUS) ×1 IMPLANT
NDL SAFETY ECLIPSE 18X1.5 (NEEDLE) ×1 IMPLANT
NDL SPNL 18GX3.5 QUINCKE PK (NEEDLE) ×1 IMPLANT
NEEDLE SPNL 18GX3.5 QUINCKE PK (NEEDLE) ×1
NS IRRIG 1000ML POUR BTL (IV SOLUTION) ×1 IMPLANT
PACK TOTAL KNEE CUSTOM (KITS) ×1 IMPLANT
PADDING CAST COTTON 6X4 STRL (CAST SUPPLIES) ×1 IMPLANT
PIN DRILL HDLS TROCAR 75 4PK (PIN) IMPLANT
PROTECTOR NERVE ULNAR (MISCELLANEOUS) ×1 IMPLANT
SAW OSC TIP CART 19.5X105X1.3 (SAW) ×1 IMPLANT
SCREW FEMALE HEX FIX 25X2.5 (ORTHOPEDIC DISPOSABLE SUPPLIES) IMPLANT
SEALER BIPOLAR AQUA 6.0 (INSTRUMENTS) ×1 IMPLANT
SET HNDPC FAN SPRY TIP SCT (DISPOSABLE) ×1 IMPLANT
SET PAD KNEE POSITIONER (MISCELLANEOUS) ×1 IMPLANT
SOLUTION PRONTOSAN WOUND 350ML (IRRIGATION / IRRIGATOR) ×1 IMPLANT
SPIKE FLUID TRANSFER (MISCELLANEOUS) ×2 IMPLANT
STEM TIB ST PERS 14+30 (Stem) IMPLANT
STEM TIBIA 5 DEG SZ G R KNEE (Knees) IMPLANT
SUT MNCRL AB 3-0 PS2 18 (SUTURE) ×1 IMPLANT
SUT MON AB 2-0 CT1 36 (SUTURE) ×1 IMPLANT
SUT STRATAFIX 14 PDO 48 VLT (SUTURE) ×1 IMPLANT
SUT STRATAFIX PDO 1 14 VIOLET (SUTURE) ×1
SUT VIC AB 1 CTX36XBRD ANBCTR (SUTURE) ×2 IMPLANT
SUT VIC AB 2-0 CT1 TAPERPNT 27 (SUTURE) ×1 IMPLANT
SYR 3ML LL SCALE MARK (SYRINGE) ×1 IMPLANT
TIBIA STEM 5 DEG SZ G R KNEE (Knees) ×1 IMPLANT
TOWEL GREEN STERILE FF (TOWEL DISPOSABLE) ×1 IMPLANT
TRAY FOLEY MTR SLVR 16FR STAT (SET/KITS/TRAYS/PACK) IMPLANT
TUBE SUCTION HIGH CAP CLEAR NV (SUCTIONS) ×1 IMPLANT
WATER STERILE IRR 1000ML POUR (IV SOLUTION) ×2 IMPLANT
WRAP KNEE MAXI GEL POST OP (GAUZE/BANDAGES/DRESSINGS) IMPLANT

## 2023-06-06 NOTE — Anesthesia Postprocedure Evaluation (Signed)
 Anesthesia Post Note  Patient: Kimberly Lyons  Procedure(s) Performed: COMPUTER ASSISTED RIGHT TOTAL KNEE ARTHROPLASTY (Right: Knee)     Patient location during evaluation: PACU Anesthesia Type: Spinal Level of consciousness: awake and alert Pain management: pain level controlled Vital Signs Assessment: post-procedure vital signs reviewed and stable Respiratory status: spontaneous breathing, nonlabored ventilation and respiratory function stable Cardiovascular status: blood pressure returned to baseline Postop Assessment: no apparent nausea or vomiting, spinal receding, no headache and no backache Anesthetic complications: no   No notable events documented.  Last Vitals:  Vitals:   06/06/23 1214 06/06/23 1215  BP:  120/69  Pulse:  63  Resp:  12  Temp: 36.4 C   SpO2:  94%    Last Pain:  Vitals:   06/06/23 1214  TempSrc:   PainSc: 6                  Vertell Row

## 2023-06-06 NOTE — Transfer of Care (Signed)
 Immediate Anesthesia Transfer of Care Note  Patient: Kimberly Lyons  Procedure(s) Performed: COMPUTER ASSISTED RIGHT TOTAL KNEE ARTHROPLASTY (Right: Knee)  Patient Location: PACU  Anesthesia Type:Spinal  Level of Consciousness: sedated  Airway & Oxygen Therapy: Patient Spontanous Breathing and Patient connected to face mask oxygen  Post-op Assessment: Report given to RN and Post -op Vital signs reviewed and stable  Post vital signs: Reviewed and stable  Last Vitals:  Vitals Value Taken Time  BP 118/68 06/06/23 1126  Temp    Pulse 71 06/06/23 1127  Resp 14 06/06/23 1127  SpO2 97 % 06/06/23 1127  Vitals shown include unfiled device data.  Last Pain:  Vitals:   06/06/23 0620  TempSrc: Oral  PainSc: 0-No pain         Complications: No notable events documented.

## 2023-06-06 NOTE — Evaluation (Signed)
 Physical Therapy Evaluation Patient Details Name: Kimberly Lyons MRN: [id number] DOB: Dec 23, 1960 Today's Date: 06/06/2023  History of Present Illness  52 female presents to therapy s/p R TKA on 06/06/2023 due to failure of conservative measures. Pt PMH includes but is not limited to: l hip fx, sCHF, hyperglycemia, AKI, HTN, L TKA (03/2023) and L THA, AA (07/2022).  Clinical Impression  Kimberly Lyons is a 63 y.o. female POD 0 s/p R TKA. Patient reports mod I with mobility at baseline. Patient is now limited by functional impairments (see PT problem list below) and requires CGA and cues for transfers and gait with RW. Patient was able to ambulate 45 and 30 feet with RW and CGA and cues for safe walker management. Patient educated on safe sequencing for stair mobility with use of one handrail and SPC, fall risk prevention, pain management and goal, use of iceman machine, and car transfers pt and daughter verbalized understanding of safe guarding position for people assisting with mobility. Patient instructed in exercises to facilitate ROM and circulation reviewed and HO provided.  Patient will benefit from continued skilled PT interventions to address impairments and progress towards PLOF. Patient has met mobility goals at adequate level for discharge home with family support and OPPT services scheduled for 2/10; will continue to follow if pt continues acute stay to progress towards Mod I goals.       If plan is discharge home, recommend the following: A little help with walking and/or transfers;A little help with bathing/dressing/bathroom;Assistance with cooking/housework;Help with stairs or ramp for entrance;Assist for transportation   Can travel by private vehicle        Equipment Recommendations None recommended by PT  Recommendations for Other Services       Functional Status Assessment Patient has had a recent decline in their functional status and demonstrates the ability to make significant  improvements in function in a reasonable and predictable amount of time.     Precautions / Restrictions Precautions Precautions: Knee;Fall Restrictions Weight Bearing Restrictions Per Provider Order: No      Mobility  Bed Mobility Overal bed mobility: Needs Assistance Bed Mobility: Supine to Sit     Supine to sit: Supervision, HOB elevated, Used rails     General bed mobility comments: min cues    Transfers Overall transfer level: Needs assistance Equipment used: Rolling walker (2 wheels) Transfers: Sit to/from Stand Sit to Stand: Contact guard assist           General transfer comment: min cues for technique, pt able  to recall proper UE placement. min A for sit to stand from standard commode    Ambulation/Gait Ambulation/Gait assistance: Contact guard assist Gait Distance (Feet): 45 Feet Assistive device: Rolling walker (2 wheels) Gait Pattern/deviations: Step-to pattern, Antalgic, Trunk flexed Gait velocity: decreased     General Gait Details: trunk flexion noted and pt reported being flexed for so long from B knee pain, pt unable to modify posture at time of eval, min cues for safety and proper AD placement pt able to recall sequencing from prior L TKA  Stairs Stairs: Yes Stairs assistance: Contact guard assist Stair Management: Two rails Number of Stairs: 2 General stair comments: step navigation initally assessed with B handrail and pt able to progress to one handrail and use of SPC per PLOF min cues  Wheelchair Mobility     Tilt Bed    Modified Rankin (Stroke Patients Only)       Balance Overall balance assessment: Needs  assistance Sitting-balance support: Feet supported Sitting balance-Leahy Scale: Good     Standing balance support: Bilateral upper extremity supported, During functional activity, Reliant on assistive device for balance Standing balance-Leahy Scale: Fair Standing balance comment: static standing no UE support                              Pertinent Vitals/Pain Pain Assessment Pain Assessment: 0-10 Pain Score: 4  Pain Location: R knee Pain Descriptors / Indicators: Aching, Constant, Discomfort, Dull, Operative site guarding Pain Intervention(s): Limited activity within patient's tolerance, Monitored during session, Premedicated before session, Repositioned, Ice applied    Home Living Family/patient expects to be discharged to:: Private residence Living Arrangements: Alone Available Help at Discharge: Family;Available 24 hours/day Type of Home: House Home Access: Stairs to enter Entrance Stairs-Rails: Right;Left Entrance Stairs-Number of Steps: 4 Alternate Level Stairs-Number of Steps: 14 Home Layout: Two level;Able to live on main level with bedroom/bathroom Home Equipment: Rolling Walker (2 wheels);Cane - single point;Crutches;Shower seat;BSC/3in1      Prior Function Prior Level of Function : Independent/Modified Independent             Mobility Comments: mod I with use of SPC in community and outdoors. no AD in home, mod I for all ADLs, self care tasks and IADLs       Extremity/Trunk Assessment        Lower Extremity Assessment Lower Extremity Assessment: RLE deficits/detail RLE Deficits / Details: ankle DF/PF 5/5;SLR < 10 degree lag RLE Sensation: decreased light touch (B feet and groin/buttocks)    Cervical / Trunk Assessment Cervical / Trunk Assessment: Normal  Communication   Communication Communication: No apparent difficulties  Cognition Arousal: Alert Behavior During Therapy: WFL for tasks assessed/performed Overall Cognitive Status: Within Functional Limits for tasks assessed                                          General Comments      Exercises Total Joint Exercises Ankle Circles/Pumps: AROM, Both, 10 reps Quad Sets: AROM, Right, 5 reps Short Arc Quad: AROM, Right, 5 reps Heel Slides: AROM, Right, 5 reps Hip ABduction/ADduction:  AROM, Right, 5 reps Straight Leg Raises: AROM, Right, 5 reps Knee Flexion: AROM, Right, 5 reps   Assessment/Plan    PT Assessment Patient needs continued PT services  PT Problem List Decreased strength;Decreased activity tolerance;Decreased range of motion;Decreased balance;Decreased mobility;Decreased coordination;Pain       PT Treatment Interventions DME instruction;Gait training;Stair training;Functional mobility training;Therapeutic activities;Therapeutic exercise;Balance training;Cognitive remediation;Modalities    PT Goals (Current goals can be found in the Care Plan section)  Acute Rehab PT Goals Patient Stated Goal: to be able to walk without pain, no AD and more normalized pattern PT Goal Formulation: With patient Time For Goal Achievement: 06/20/23 Potential to Achieve Goals: Good    Frequency 7X/week     Co-evaluation               AM-PAC PT 6 Clicks Mobility  Outcome Measure Help needed turning from your back to your side while in a flat bed without using bedrails?: None Help needed moving from lying on your back to sitting on the side of a flat bed without using bedrails?: A Little Help needed moving to and from a bed to a chair (including a wheelchair)?: A Little Help needed standing  up from a chair using your arms (e.g., wheelchair or bedside chair)?: A Little Help needed to walk in hospital room?: A Little Help needed climbing 3-5 steps with a railing? : A Little 6 Click Score: 19    End of Session Equipment Utilized During Treatment: Gait belt Activity Tolerance: Patient tolerated treatment well;No increased pain Patient left: in chair;with call bell/phone within reach;with family/visitor present Nurse Communication: Mobility status;Other (comment) (pt readiness for d/c from PT standpoint) PT Visit Diagnosis: Unsteadiness on feet (R26.81);Other abnormalities of gait and mobility (R26.89);Muscle weakness (generalized) (M62.81);History of falling  (Z91.81);Difficulty in walking, not elsewhere classified (R26.2);Pain Pain - Right/Left: Right Pain - part of body: Knee;Leg    Time: 8595-8556 PT Time Calculation (min) (ACUTE ONLY): 39 min   Charges:   PT Evaluation $PT Eval Low Complexity: 1 Low PT Treatments $Gait Training: 8-22 mins $Therapeutic Exercise: 8-22 mins PT General Charges $$ ACUTE PT VISIT: 1 Visit         [place], PT Acute Rehab   [place] VEAR Drone 06/06/2023, 2:54 PM

## 2023-06-06 NOTE — Discharge Instructions (Signed)

## 2023-06-06 NOTE — Op Note (Signed)
 OPERATIVE REPORT  SURGEON: Redell Shoals, MD   ASSISTANT: Valery Potters, PA-C  PREOPERATIVE DIAGNOSIS: Primary Right knee arthritis.   POSTOPERATIVE DIAGNOSIS: Primary Right knee arthritis.   PROCEDURE: Computer assisted Right total knee arthroplasty.   IMPLANTS: Zimmer Persona PPS Cementless CR femur, size 10. Persona cemented natural tibia, size G, with 14 x 30 mm stem extension. Vivacit-E polyethelyene insert, size 10 mm, CR. OsseoTi 3-peg patella, size 35 mm. Refobacin R bone cement.  ANESTHESIA:  MAC, Regional, and Spinal  TOURNIQUET TIME: Not utilized.   ESTIMATED BLOOD LOSS:-150 mL    ANTIBIOTICS: 2g Ancef .  DRAINS: None.  COMPLICATIONS: None   CONDITION: PACU - hemodynamically stable.   BRIEF CLINICAL NOTE: Kimberly Lyons is a 63 y.o. female with a long-standing history of Right knee arthritis. After failing conservative management, the patient was indicated for total knee arthroplasty. The risks, benefits, and alternatives to the procedure were explained, and the patient elected to proceed.  PROCEDURE IN DETAIL: Adductor canal block was obtained in the pre-op holding area. Once inside the operative room, spinal anesthesia was obtained, and a foley catheter was inserted. The patient was then positioned and the lower extremity was prepped and draped in the normal sterile surgical fashion.  A time-out was called verifying side and site of surgery. The patient received IV antibiotics within 60 minutes of beginning the procedure. A tourniquet was not utilized.   An anterior approach to the knee was performed utilizing a midvastus arthrotomy. A medial release was performed and the patellar fat pad was excised. Stryker imageless navigation was used to cut the distal femur perpendicular to the mechanical axis. A freehand patellar resection was performed, and the patella was sized an prepared with 3 lug holes.  Nagivation was used to make a neutral proximal tibia resection, taking  3 mm of bone from the less affected medial side with 3 degrees of slope. The menisci were excised. A spacer block was placed, and the alignment and balance in extension were confirmed.   The distal femur was sized using the 3-degree external rotation guide referencing the posterior femoral cortex. The appropriate 4-in-1 cutting block was pinned into place. Rotation was checked using Whiteside's line, the epicondylar axis, and then confirmed with a spacer block in flexion. The remaining femoral cuts were performed, taking care to protect the MCL.  The tibia was sized and the trial tray was pinned into place. The remaining trail components were inserted. The knee was stable to varus and valgus stress through a full range of motion. The patella tracked centrally, and the PCL was well balanced. The trial components were removed, and the proximal tibial surface was prepared. Final tibial component was cemented into place. The remainder of the components were impacted into place.  The knee was brought into extension while the cement polymerized.  Excess cement was cleared.  The knee was tested for a final time and found to be well balanced.   The wound was copiously irrigated with Prontosan solution and normal saline using pulse lavage.  Marcaine  solution was injected into the periarticular soft tissue.  The wound was closed in layers using #1 Vicryl and Stratafix for the fascia, 2-0 Vicryl for the subcutaneous fat, 2-0 Monocryl for the deep dermal layer, 3-0 running Monocryl subcuticular Stitch, and 4-0 Monocryl stay sutures at both ends of the wound. Dermabond was applied to the skin.  Once the glue was fully dried, an Aquacell Ag and compressive dressing were applied.  The patient was transported  to the recovery room in stable condition.  Sponge, needle, and instrument counts were correct at the end of the case x2.  The patient tolerated the procedure well and there were no known complications.  The aquamantis  was utilized for this case to help facilitate better hemostasis as patient was felt to be at increased risk of bleeding because of complex case requiring increased OR time and/or exposure.  -minimally invasive approach.  A oscillating saw tip was utilized for this case to prevent damage to the soft tissue structures such as muscles, ligaments and tendons, and to ensure accurate bone cuts. This patient was at increased risk for above structures due to  minimally invasive approach.  Please note that a surgical assistant was a medical necessity for this procedure in order to perform it in a safe and expeditious manner. Surgical assistant was necessary to retract the ligaments and vital neurovascular structures to prevent injury to them and also necessary for proper positioning of the limb to allow for anatomic placement of the prosthesis.

## 2023-06-06 NOTE — Anesthesia Preprocedure Evaluation (Addendum)
 Anesthesia Evaluation  Patient identified by MRN, date of birth, ID band Patient awake    Reviewed: Allergy & Precautions, NPO status , Patient's Chart, lab work & pertinent test results, reviewed documented beta blocker date and time   History of Anesthesia Complications Negative for: history of anesthetic complications  Airway Mallampati: II  TM Distance: >3 FB Neck ROM: Full    Dental no notable dental hx.    Pulmonary neg pulmonary ROS   Pulmonary exam normal        Cardiovascular hypertension, Pt. on medications and Pt. 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  Reproductive/Obstetrics negative OB ROS                             Anesthesia Physical Anesthesia Plan  ASA: 2  Anesthesia Plan: Spinal   Post-op Pain Management: Tylenol  PO (pre-op)* and Regional block*   Induction:   PONV Risk Score and Plan: 3 and Treatment may vary due to age or medical condition, Ondansetron , Propofol  infusion, Dexamethasone  and Midazolam   Airway Management Planned: Natural Airway and Simple Face Mask  Additional Equipment: None  Intra-op Plan:   Post-operative Plan:   Informed Consent: I have reviewed the patients History and Physical, chart, labs and discussed the procedure including the risks, benefits and alternatives for the proposed anesthesia with the patient or authorized representative who has  indicated his/her understanding and acceptance.       Plan Discussed with: CRNA  Anesthesia Plan Comments:        Anesthesia Quick Evaluation

## 2023-06-06 NOTE — Anesthesia Procedure Notes (Signed)
 Anesthesia Regional Block: Adductor canal block   Pre-Anesthetic Checklist: , timeout performed,  Correct Patient, Correct Site, Correct Laterality,  Correct Procedure, Correct Position, site marked,  Risks and benefits discussed,  Pre-op evaluation,  At surgeon's request and post-op pain management  Laterality: Right  Prep: Maximum Sterile Barrier Precautions used, chloraprep       Needles:  Injection technique: Single-shot  Needle Type: Echogenic Stimulator Needle     Needle Length: 9cm  Needle Gauge: 22     Additional Needles:   Procedures:,,,, ultrasound used (permanent image in chart),,    Narrative:  Start time: 06/06/2023 8:15 AM End time: 06/06/2023 8:18 AM Injection made incrementally with aspirations every 5 mL.  Performed by: Personally  Anesthesiologist: Lyons Lamarr BRAVO, MD  Additional Notes: Risks, benefits, and alternative discussed. Patient gave consent for procedure. Patient prepped and draped in sterile fashion. Sedation administered, patient remains easily responsive to voice. Relevant anatomy identified with ultrasound guidance. Local anesthetic given in 5cc increments with no signs or symptoms of intravascular injection. No pain or paraesthesias with injection. Patient monitored throughout procedure with signs of LAST or immediate complications. Tolerated well. Ultrasound image placed in chart.  Kimberly Paul, MD

## 2023-06-06 NOTE — Interval H&P Note (Signed)
 History and Physical Interval Note:  06/06/2023 8:34 AM  Kimberly Lyons  has presented today for surgery, with the diagnosis of Right knee osteoarthritis.  The various methods of treatment have been discussed with the patient and family. After consideration of risks, benefits and other options for treatment, the patient has consented to  Procedure(s) with comments: COMPUTER ASSISTED TOTAL KNEE ARTHROPLASTY (Right) - 170 as a surgical intervention.  The patient's history has been reviewed, patient examined, no change in status, stable for surgery.  I have reviewed the patient's chart and labs.  Questions were answered to the patient's satisfaction.     Redell PARAS Tritia Endo

## 2023-06-06 NOTE — Anesthesia Procedure Notes (Signed)
 Spinal  Patient location during procedure: OR Start time: 06/06/2023 8:51 AM End time: 06/06/2023 8:54 AM Reason for block: surgical anesthesia Staffing Performed: anesthesiologist  Anesthesiologist: Paul Lamarr BRAVO, MD Performed by: Paul Lamarr BRAVO, MD Authorized by: Paul Lamarr BRAVO, MD   Preanesthetic Checklist Completed: patient identified, IV checked, risks and benefits discussed, surgical consent, monitors and equipment checked, pre-op evaluation and timeout performed Spinal Block Patient position: sitting Prep: DuraPrep and site prepped and draped Patient monitoring: continuous pulse ox, blood pressure and heart rate Approach: midline Location: L3-4 Injection technique: single-shot Needle Needle type: Pencan  Needle gauge: 24 G Needle length: 9 cm Assessment Events: CSF return Additional Notes Risks, benefits, and alternative discussed. Patient gave consent to procedure. Prepped and draped in sitting position. Patient sedated but responsive to voice. Clear CSF obtained after one needle pass. Positive terminal aspiration. No pain or paraesthesias with injection. Patient tolerated procedure well. Vital signs stable. LANEY Paul, MD

## 2023-06-07 ENCOUNTER — Encounter (HOSPITAL_COMMUNITY): Payer: Self-pay | Admitting: Orthopedic Surgery

## 2023-06-09 ENCOUNTER — Other Ambulatory Visit: Payer: Self-pay | Admitting: Cardiology

## 2023-06-10 ENCOUNTER — Other Ambulatory Visit: Payer: Self-pay | Admitting: Cardiology

## 2023-07-04 ENCOUNTER — Other Ambulatory Visit: Payer: Self-pay | Admitting: Cardiology

## 2023-07-18 DIAGNOSIS — I493 Ventricular premature depolarization: Secondary | ICD-10-CM | POA: Insufficient documentation

## 2023-07-18 NOTE — Progress Notes (Unsigned)
 Cardiology Office Note:   Date:  07/19/2023  ID:  Kimberly Lyons [birth year], MRN [id number] PCP: Karie Georges, MD  [facility] Providers Cardiologist:  Rollene Rotunda, MD {   History of Present Illness:   Kimberly Lyons is a 63 y.o. female who presents for follow up of CHF.  The patient was admitted on 07/30/2018.  She had acute respiratory failure.  She was treated for acute CHF.  She was found to have an EF of 30% with severe LVH and global hypokinesis.  We have continued to titrate meds.   We have been titrating meds.  She had a follow up echo with the EF returning to normal.    Since I last saw her she broke her hip and had that fixed in April of last year.  She had elective left knee surgery in November of last year and elective right knee surgery in February of this year.  She has recovered from all of this and is working with physical therapy.  She is taking The patient denies any new symptoms such as chest discomfort, neck or arm discomfort. There has been no new shortness of breath, PND or orthopnea. There have been no reported palpitations, presyncope or syncope.  As stated in the HPI and negative for all other systems.  ROS: As stated in the HPI and negative for all other systems.  Studies Reviewed:    EKG:   EKG Interpretation Date/Time:  Thursday July 19 2023 11:27:52 EDT Ventricular Rate:  61 PR Interval:  158 QRS Duration:  80 QT Interval:  404 QTC Calculation: 406 R Axis:   5  Text Interpretation: Sinus rhythm with frequent Premature ventricular complexes When compared with ECG of 10-Aug-2022 10:38, No significant change since last tracing Confirmed by Rollene Rotunda ([id number]) on 07/19/2023 12:11:34 PM    Risk Assessment/Calculations:              Physical Exam:   VS:  BP 106/60 (BP Location: Right Arm, Patient Position: Sitting, Cuff Size: Normal)   Pulse 61   Ht 5\' 9"  (1.753 m)   Wt 251 lb (113.9 kg)   SpO2 99%   BMI 37.07 kg/m    Wt  Readings from Last 3 Encounters:  07/19/23 251 lb (113.9 kg)  06/06/23 242 lb 8.1 oz (110 kg)  05/25/23 244 lb (110.7 kg)     GEN: Well nourished, well developed in no acute distress NECK: No JVD; No carotid bruits CARDIAC: RRR, no murmurs, rubs, gallops RESPIRATORY:  Clear to auscultation without rales, wheezing or rhonchi  ABDOMEN: Soft, non-tender, non-distended EXTREMITIES:  No edema; No deformity   ASSESSMENT AND PLAN:   CHRONIC SYSTOLIC HF: She had return of her ejection fraction to normal.  I would not suspected to be reduced based on her absence of symptoms and her normal exam.  She will continue the meds as listed.  No change in therapy.   HTN:  This is is being managed in the context of treating her heart failure.  I  ELEVATED BS: A1c was previously 6.1.  No change in therapy.   MORBID OBEISTY:   She is lost a lot of weight from her peak.  That weight loss stalled with her joint problems.  Hopefully she can get back to a weight loss strategy and I applaud her efforts to date.    PVCs:   She has these on her EKG and no symptoms.  No change in therapy.  Follow up with me in 1 year  Signed, Rollene Rotunda, MD

## 2023-07-19 ENCOUNTER — Encounter: Payer: Self-pay | Admitting: Cardiology

## 2023-07-19 ENCOUNTER — Ambulatory Visit: Payer: 59 | Attending: Cardiology | Admitting: Cardiology

## 2023-07-19 VITALS — BP 106/60 | HR 61 | Ht 69.0 in | Wt 251.0 lb

## 2023-07-19 DIAGNOSIS — I10 Essential (primary) hypertension: Secondary | ICD-10-CM | POA: Diagnosis not present

## 2023-07-19 DIAGNOSIS — I493 Ventricular premature depolarization: Secondary | ICD-10-CM

## 2023-07-19 DIAGNOSIS — I5022 Chronic systolic (congestive) heart failure: Secondary | ICD-10-CM

## 2023-07-19 MED ORDER — FUROSEMIDE 20 MG PO TABS: 20.0000 mg | ORAL_TABLET | Freq: Every day | ORAL | 11 refills | Status: AC | PRN

## 2023-07-19 MED ORDER — SPIRONOLACTONE 25 MG PO TABS: 25.0000 mg | ORAL_TABLET | Freq: Every day | ORAL | 3 refills | Status: AC

## 2023-07-19 MED ORDER — ENTRESTO 97-103 MG PO TABS: 1.0000 | ORAL_TABLET | Freq: Two times a day (BID) | ORAL | 3 refills | Status: AC

## 2023-07-19 MED ORDER — CARVEDILOL 25 MG PO TABS: 25.0000 mg | ORAL_TABLET | Freq: Two times a day (BID) | ORAL | 3 refills | Status: DC

## 2023-07-19 NOTE — Patient Instructions (Signed)

## 2023-09-06 ENCOUNTER — Other Ambulatory Visit: Payer: Self-pay | Admitting: Family Medicine

## 2023-09-06 DIAGNOSIS — R7303 Prediabetes: Secondary | ICD-10-CM

## 2023-10-04 ENCOUNTER — Other Ambulatory Visit: Payer: Self-pay | Admitting: Family Medicine

## 2023-10-04 DIAGNOSIS — R7303 Prediabetes: Secondary | ICD-10-CM

## 2023-11-03 ENCOUNTER — Other Ambulatory Visit: Payer: Self-pay | Admitting: Family Medicine

## 2023-11-03 DIAGNOSIS — R7303 Prediabetes: Secondary | ICD-10-CM

## 2023-11-07 ENCOUNTER — Other Ambulatory Visit: Payer: Self-pay | Admitting: Family Medicine

## 2023-11-07 DIAGNOSIS — R7303 Prediabetes: Secondary | ICD-10-CM

## 2023-12-14 ENCOUNTER — Ambulatory Visit (INDEPENDENT_AMBULATORY_CARE_PROVIDER_SITE_OTHER): Admitting: Family Medicine

## 2023-12-14 ENCOUNTER — Ambulatory Visit: Payer: Self-pay | Admitting: Family Medicine

## 2023-12-14 ENCOUNTER — Encounter: Payer: Self-pay | Admitting: Family Medicine

## 2023-12-14 VITALS — BP 98/60 | HR 63 | Temp 98.4°F | Ht 69.0 in | Wt 258.7 lb

## 2023-12-14 DIAGNOSIS — I10 Essential (primary) hypertension: Secondary | ICD-10-CM | POA: Diagnosis not present

## 2023-12-14 DIAGNOSIS — Z780 Asymptomatic menopausal state: Secondary | ICD-10-CM

## 2023-12-14 DIAGNOSIS — Z0000 Encounter for general adult medical examination without abnormal findings: Secondary | ICD-10-CM

## 2023-12-14 DIAGNOSIS — Z23 Encounter for immunization: Secondary | ICD-10-CM | POA: Diagnosis not present

## 2023-12-14 DIAGNOSIS — L649 Androgenic alopecia, unspecified: Secondary | ICD-10-CM

## 2023-12-14 DIAGNOSIS — R7303 Prediabetes: Secondary | ICD-10-CM

## 2023-12-14 DIAGNOSIS — M810 Age-related osteoporosis without current pathological fracture: Secondary | ICD-10-CM

## 2023-12-14 DIAGNOSIS — Z1231 Encounter for screening mammogram for malignant neoplasm of breast: Secondary | ICD-10-CM

## 2023-12-14 DIAGNOSIS — Z1211 Encounter for screening for malignant neoplasm of colon: Secondary | ICD-10-CM

## 2023-12-14 LAB — LIPID PANEL
Cholesterol: 153 mg/dL (ref 0–200)
HDL: 48.6 mg/dL (ref >39.00)
LDL Cholesterol: 86 mg/dL (ref 0–99)
NonHDL: 104.42
Total CHOL/HDL Ratio: 3
Triglycerides: 93 mg/dL (ref 0.0–149.0)
VLDL: 18.6 mg/dL (ref 0.0–40.0)

## 2023-12-14 LAB — COMPREHENSIVE METABOLIC PANEL WITH GFR
ALT: 10 U/L (ref 0–35)
AST: 12 U/L (ref 0–37)
Albumin: 4.1 g/dL (ref 3.5–5.2)
Alkaline Phosphatase: 74 U/L (ref 39–117)
BUN: 19 mg/dL (ref 6–23)
CO2: 23 meq/L (ref 19–32)
Calcium: 9.2 mg/dL (ref 8.4–10.5)
Chloride: 104 meq/L (ref 96–112)
Creatinine, Ser: 0.96 mg/dL (ref 0.40–1.20)
GFR: 62.08 mL/min (ref >60.00)
Glucose, Bld: 107 mg/dL — ABNORMAL HIGH (ref 70–99)
Potassium: 4.5 meq/L (ref 3.5–5.1)
Sodium: 136 meq/L (ref 135–145)
Total Bilirubin: 0.5 mg/dL (ref 0.2–1.2)
Total Protein: 7.3 g/dL (ref 6.0–8.3)

## 2023-12-14 LAB — POCT GLYCOSYLATED HEMOGLOBIN (HGB A1C): Hemoglobin A1C: 5.8 % — AB (ref 4.0–5.6)

## 2023-12-14 MED ORDER — METFORMIN HCL 500 MG PO TABS: 500.0000 mg | ORAL_TABLET | Freq: Two times a day (BID) | ORAL | 1 refills | Status: AC

## 2023-12-14 MED ORDER — DUTASTERIDE 0.5 MG PO CAPS: 0.5000 mg | ORAL_CAPSULE | Freq: Every day | ORAL | 1 refills | Status: AC

## 2023-12-14 NOTE — Progress Notes (Signed)
 Complete physical exam  Patient: Kimberly Lyons   DOB: Aug 20, 1960   63 y.o. Female  MRN: [id number]  Subjective:    Chief Complaint  Patient presents with   Medical Management of Chronic Issues   Obesity    Patient requests Rx for weight loss    Kimberly Lyons is a 63 y.o. female who presents today for a complete physical exam. She reports consuming a general diet. Exercise is limited by orthopedic condition(s): had knee and hip replacement recently. She generally feels well. She reports sleeping well. She does have additional problems to discuss today.     Hair loss-- pt reports she has in the last 2 years experienced hair loss. States that it runs in her family. There is no itching or flaking scalp, no rashes or lesions in the hair line.   Prediabetes-- pt reports she is taking the metformin  as prescribed. No changes in vision, no increased urination, no weight loss or gain. States she hasn't been as active because she has undergone 3 joint replacements over the last year or so. States she is still in physical therapy for her knee replacements. A1C performed in office today and is 5.8.   HTN -- BP in office performed and is well controlled. She  reports no side effects to the medications, no chest pain, SOB, dizziness or headaches. She has a BP cuff at home and is checking BP regularly, reports they are in the normal range.    Most recent fall risk assessment:     No data to display           Most recent depression screenings:    12/14/2023    9:55 AM 06/23/2022    1:45 PM  PHQ 2/9 Scores  PHQ - 2 Score 0 0  PHQ- 9 Score 1 0    Vision:Within last year and Dental: No current dental problems and Receives regular dental care  Patient Active Problem List   Diagnosis Date Noted   PVC's (premature ventricular contractions) 07/18/2023   Osteoarthritis of right knee 06/06/2023   Osteoarthritis of left knee 03/07/2023   Hip fracture, left, closed, initial encounter (HCC)  08/10/2022   Chronic systolic HF (heart failure) (HCC) 07/16/2019   Hyperglycemia 02/12/2019   Morbid obesity (HCC) 02/12/2019   AKI (acute kidney injury) (HCC) 09/05/2018   Medication management 09/05/2018   Educated about COVID-19 virus infection 08/27/2018   Essential hypertension 08/27/2018   BMI 45.0-49.9, adult (HCC) 08/26/2018   Prediabetes 08/26/2018   Heart failure (HCC) 07/28/2018   Acute heart failure (HCC) 07/28/2018      Patient Care Team: Ozell Heron HERO, MD as PCP - General (Family Medicine) Lavona Agent, MD as PCP - Cardiology (Cardiology)   Outpatient Medications Prior to Visit  Medication Sig   acetaminophen  (TYLENOL ) 500 MG tablet Take 1,000 mg by mouth every 6 (six) hours as needed for moderate pain (pain score 4-6).   carvedilol  (COREG ) 25 MG tablet Take 1 tablet (25 mg total) by mouth 2 (two) times daily for 90 doses.   furosemide  (LASIX ) 20 MG tablet Take 1 tablet (20 mg total) by mouth daily as needed (fluid retention/high sodium food intake).   methocarbamol  (ROBAXIN ) 500 MG tablet Take 1 tablet (500 mg total) by mouth every 6 (six) hours as needed.   naproxen sodium (ALEVE) 220 MG tablet Take 220 mg by mouth 2 (two) times daily as needed (for pain).   ondansetron  (ZOFRAN ) 4 MG tablet Take 1 tablet (  4 mg total) by mouth every 8 (eight) hours as needed for nausea or vomiting.   sacubitril -valsartan  (ENTRESTO ) 97-103 MG Take 1 tablet by mouth 2 (two) times daily.   spironolactone  (ALDACTONE ) 25 MG tablet Take 1 tablet (25 mg total) by mouth daily.   [DISCONTINUED] metFORMIN  (GLUCOPHAGE ) 500 MG tablet TAKE 1 TABLET BY MOUTH TWICE A DAY WITH FOOD   No facility-administered medications prior to visit.    Review of Systems  HENT:  Negative for hearing loss.   Eyes:  Negative for blurred vision.  Respiratory:  Negative for shortness of breath.   Cardiovascular:  Negative for chest pain.  Gastrointestinal: Negative.   Genitourinary: Negative.    Musculoskeletal:  Negative for back pain.  Neurological:  Negative for headaches.  Psychiatric/Behavioral:  Negative for depression.        Objective:     BP 98/60   Pulse 63   Temp 98.4 F (36.9 C) (Oral)   Ht 5' 9 (1.753 m)   Wt 258 lb 11.2 oz (117.3 kg)   SpO2 98%   BMI 38.20 kg/m    Physical Exam Vitals reviewed.  Constitutional:      Appearance: Normal appearance. She is well-groomed. She is obese.  HENT:     Right Ear: Tympanic membrane and ear canal normal.     Left Ear: Tympanic membrane and ear canal normal.     Mouth/Throat:     Mouth: Mucous membranes are moist.     Pharynx: No posterior oropharyngeal erythema.  Eyes:     Conjunctiva/sclera: Conjunctivae normal.  Neck:     Thyroid: No thyromegaly.  Cardiovascular:     Rate and Rhythm: Normal rate and regular rhythm.     Pulses: Normal pulses.     Heart sounds: S1 normal and S2 normal.  Pulmonary:     Effort: Pulmonary effort is normal.     Breath sounds: Normal breath sounds and air entry.  Abdominal:     General: Abdomen is flat. Bowel sounds are normal.     Palpations: Abdomen is soft.  Musculoskeletal:     Right lower leg: No edema.     Left lower leg: No edema.  Lymphadenopathy:     Cervical: No cervical adenopathy.  Neurological:     Mental Status: She is alert and oriented to person, place, and time. Mental status is at baseline.     Gait: Gait is intact.  Psychiatric:        Mood and Affect: Mood and affect normal.        Speech: Speech normal.        Behavior: Behavior normal.        Judgment: Judgment normal.         Assessment & Plan:    Routine Health Maintenance and Physical Exam  Immunization History  Administered Date(s) Administered   Fluad Quad(high Dose 65+) 02/14/2019   Influenza-Unspecified 02/25/2021   PNEUMOCOCCAL CONJUGATE-20 12/14/2023   Pfizer Covid-19 Vaccine Bivalent Booster 80yrs & up 02/25/2021   Tdap 09/02/2012, 12/14/2023   Zoster Recombinant(Shingrix)  02/27/2020, 09/16/2020    Health Maintenance  Topic Date Due   Cervical Cancer Screening (HPV/Pap Cotest)  Never done   Colonoscopy  Never done   MAMMOGRAM  Never done   COVID-19 Vaccine (2 - Pfizer risk series) 03/18/2021   DEXA SCAN  Never done   INFLUENZA VACCINE  11/30/2023   Hepatitis C Screening  12/13/2024 (Originally 06/05/1978)   DTaP/Tdap/Td (3 - Td or Tdap) 12/13/2033  Pneumococcal Vaccine: 50+ Years  Completed   HIV Screening  Completed   Zoster Vaccines- Shingrix  Completed   Hepatitis B Vaccines 19-59 Average Risk  Aged Out   HPV VACCINES  Aged Out   Meningococcal B Vaccine  Aged Out   Pneumococcal Vaccine  Discontinued    Discussed health benefits of physical activity, and encouraged her to engage in regular exercise appropriate for her age and condition.  Prediabetes Assessment & Plan: Chronic, stable, A1C is well controlled. She continues on the low carb diet and metformin  500 mg BID, she is doing very well with this plan. Will see her back in 6 months for follow up and blood work. Lipid panel is ordered today for cardiac risk assessment.   Orders: -     POCT glycosylated hemoglobin (Hb A1C) -     Collection capillary blood specimen -     Lipid panel; Future -     metFORMIN  HCl; Take 1 tablet (500 mg total) by mouth 2 (two) times daily with a meal.  Dispense: 180 tablet; Refill: 1  Essential hypertension Assessment & Plan: Current hypertension medications:       Sig   carvedilol  (COREG ) 25 MG tablet (Taking) Take 1 tablet (25 mg total) by mouth 2 (two) times daily for 90 doses.   furosemide  (LASIX ) 20 MG tablet (Taking As Needed) Take 1 tablet (20 mg total) by mouth daily as needed (fluid retention/high sodium food intake).   sacubitril -valsartan  (ENTRESTO ) 97-103 MG (Taking) Take 1 tablet by mouth 2 (two) times daily.   spironolactone  (ALDACTONE ) 25 MG tablet (Taking) Take 1 tablet (25 mg total) by mouth daily.      Chronic, stable, BP is well  controlled. Will continue meds listed above as prescribed.  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 She is only on 25 mg of spironolactone  daily, I advised starting dutasteride  and I counseled her about topical minoxidil, instructions written on the AVS.   -     Dutasteride ; Take 1 capsule (0.5 mg total) by mouth daily.  Dispense: 90 capsule; Refill: 1  Normal physical exam findings. I counseled the patient on the recommended amount of exercise per CDC recommendation. I reviewed preventative screening, immunizations, and medical history and updated in the chart, and appropriate labs and vaccinations were ordered. Handouts given on healthy eating and exercise.    Return in about 6 months (around 06/15/2024) for HTN.     Heron CHRISTELLA Sharper, MD

## 2023-12-14 NOTE — Patient Instructions (Signed)
 Minoxidil 5% topical foam -- apply daily before bed.

## 2023-12-14 NOTE — Assessment & Plan Note (Signed)
 Current hypertension medications:       Sig   carvedilol  (COREG ) 25 MG tablet (Taking) Take 1 tablet (25 mg total) by mouth 2 (two) times daily for 90 doses.   furosemide  (LASIX ) 20 MG tablet (Taking As Needed) Take 1 tablet (20 mg total) by mouth daily as needed (fluid retention/high sodium food intake).   sacubitril -valsartan  (ENTRESTO ) 97-103 MG (Taking) Take 1 tablet by mouth 2 (two) times daily.   spironolactone  (ALDACTONE ) 25 MG tablet (Taking) Take 1 tablet (25 mg total) by mouth daily.      Chronic, stable, BP is well controlled. Will continue meds listed above as prescribed.

## 2023-12-14 NOTE — Assessment & Plan Note (Signed)
 Chronic, stable, A1C is well controlled. She continues on the low carb diet and metformin  500 mg BID, she is doing very well with this plan. Will see her back in 6 months for follow up and blood work. Lipid panel is ordered today for cardiac risk assessment.

## 2024-01-02 ENCOUNTER — Ambulatory Visit

## 2024-01-03 LAB — COLOGUARD: COLOGUARD: NEGATIVE

## 2024-01-11 ENCOUNTER — Ambulatory Visit
Admission: RE | Admit: 2024-01-11 | Discharge: 2024-01-11 | Disposition: A | Discharge status: Home / Self Care | Source: Ambulatory Visit | Attending: Family Medicine

## 2024-01-11 DIAGNOSIS — Z1231 Encounter for screening mammogram for malignant neoplasm of breast: Secondary | ICD-10-CM

## 2024-01-16 ENCOUNTER — Other Ambulatory Visit: Payer: Self-pay | Admitting: Family Medicine

## 2024-01-16 DIAGNOSIS — R928 Other abnormal and inconclusive findings on diagnostic imaging of breast: Secondary | ICD-10-CM

## 2024-01-25 ENCOUNTER — Ambulatory Visit
Admission: RE | Admit: 2024-01-25 | Discharge: 2024-01-25 | Disposition: A | Discharge status: Home / Self Care | Source: Ambulatory Visit | Attending: Family Medicine | Admitting: Family Medicine

## 2024-01-25 ENCOUNTER — Ambulatory Visit

## 2024-01-25 DIAGNOSIS — R928 Other abnormal and inconclusive findings on diagnostic imaging of breast: Secondary | ICD-10-CM

## 2024-01-28 ENCOUNTER — Ambulatory Visit: Payer: Self-pay | Admitting: Family Medicine

## 2024-01-31 ENCOUNTER — Ambulatory Visit (HOSPITAL_BASED_OUTPATIENT_CLINIC_OR_DEPARTMENT_OTHER)
Admission: RE | Admit: 2024-01-31 | Discharge: 2024-01-31 | Disposition: A | Discharge status: Home / Self Care | Source: Ambulatory Visit | Attending: Family Medicine | Admitting: Family Medicine

## 2024-01-31 DIAGNOSIS — Z780 Asymptomatic menopausal state: Secondary | ICD-10-CM | POA: Insufficient documentation

## 2024-02-01 ENCOUNTER — Telehealth: Payer: Self-pay

## 2024-02-01 ENCOUNTER — Other Ambulatory Visit: Payer: Self-pay

## 2024-02-01 ENCOUNTER — Other Ambulatory Visit (HOSPITAL_COMMUNITY): Payer: Self-pay

## 2024-02-01 DIAGNOSIS — M810 Age-related osteoporosis without current pathological fracture: Secondary | ICD-10-CM

## 2024-02-01 MED ORDER — ROMOSOZUMAB-AQQG 105 MG/1.17ML ~~LOC~~ SOSY: 210.0000 mg | PREFILLED_SYRINGE | Freq: Once | SUBCUTANEOUS | Status: AC

## 2024-02-01 NOTE — Telephone Encounter (Signed)
 Please check her insurance-- her t score was in the severe range -5.6 I think, needs evenity for 1 year then swtiched to prolia

## 2024-02-01 NOTE — Telephone Encounter (Signed)
 Evenity VOB initiated via [email or web address]  Last Evenity inj:  Next Evenity inj DUE:  [employer]

## 2024-02-04 ENCOUNTER — Other Ambulatory Visit (HOSPITAL_COMMUNITY): Payer: Self-pay

## 2024-02-05 MED ORDER — ALENDRONATE SODIUM 70 MG PO TABS
70.0000 mg | ORAL_TABLET | ORAL | 3 refills | Status: AC
Start: 2024-02-05 — End: ?

## 2024-02-07 ENCOUNTER — Other Ambulatory Visit (HOSPITAL_COMMUNITY): Payer: Self-pay

## 2024-02-07 NOTE — Telephone Encounter (Deleted)
 Kimberly Lyons

## 2024-02-12 ENCOUNTER — Other Ambulatory Visit (HOSPITAL_COMMUNITY): Payer: Self-pay

## 2024-02-12 NOTE — Telephone Encounter (Signed)
 SABRA

## 2024-02-13 NOTE — Telephone Encounter (Signed)
 Called insurance at [phone] to initiate PA. Insurance is down and could not complete call.

## 2024-02-21 ENCOUNTER — Other Ambulatory Visit (HOSPITAL_COMMUNITY): Payer: Self-pay

## 2024-02-21 NOTE — Telephone Encounter (Signed)
 Called insurance to initiate PA. Transferred to [phone]. Representative faxing over PA form. Will complete and fax back once received. Per representative, turnaround time is 24 hours to 15 calendar days.

## 2024-02-21 NOTE — Telephone Encounter (Signed)
    PHARMACY PA STARTED VIA LATENT. KEY: [id number]

## 2024-02-21 NOTE — Telephone Encounter (Signed)
 Prior Authorization form/request asks a question that requires your assistance. Please see the question below and advise accordingly. The PA will not be submitted until the necessary information is received.

## 2024-02-21 NOTE — Telephone Encounter (Signed)
 PA form completed and faxed back to [phone]

## 2024-02-29 ENCOUNTER — Other Ambulatory Visit (HOSPITAL_COMMUNITY): Payer: Self-pay

## 2024-02-29 ENCOUNTER — Telehealth: Payer: Self-pay

## 2024-02-29 NOTE — Telephone Encounter (Signed)
 Looks like forteo is preferred, however I do not think we are able to give her forteo in the office. Can we check her for Prolia? Thanks!

## 2024-02-29 NOTE — Telephone Encounter (Signed)
 Kimberly Lyons

## 2024-02-29 NOTE — Telephone Encounter (Signed)
New encounter created for Prolia BIV 

## 2024-03-04 ENCOUNTER — Other Ambulatory Visit (HOSPITAL_COMMUNITY): Payer: Self-pay

## 2024-03-04 NOTE — Telephone Encounter (Addendum)
 Called insurance at [phone] to initiate PA. Representative will fax over PA form. Will complete and fax back once received.  PA#: [id number]

## 2024-03-05 ENCOUNTER — Other Ambulatory Visit (HOSPITAL_COMMUNITY): Payer: Self-pay

## 2024-03-05 NOTE — Telephone Encounter (Signed)
 Attempted to contact patient to discuss enrollment in the copay card program. Had to leave a voicemail.

## 2024-03-05 NOTE — Telephone Encounter (Signed)
 Never received PA form. Called insurance back, spoke to another representative. That representative gave CMM key to submit PA electronically. PA cancelled in Latent due to: The receiver is not the PA processor for this patient and medication combination. Called insurance again, spoke to another representative. Representative resending PA form. Will complete and fax back once received.

## 2024-03-05 NOTE — Telephone Encounter (Signed)
 Pt ready for scheduling for PROLIA on or after : 03/05/24  Option# 2- Med Obtained from pharmacy:  Pharmacy benefit: Copay $150 (Paid to pharmacy) Admin Fee: 30% (Pay at clinic)  Prior Auth: APPROVED PA# [id number]  Expiration Date: 03/05/24-03/05/25  # of doses approved: 2   If patient wants fill through the pharmacy benefit please send prescription to: [facility], and include estimated need by date in rx notes. Pharmacy will ship medication directly to the office.  Patient IS eligible for Prolia Copay Card. Copay Card can make patient's cost as little as $25. Link to apply: [email or web address]://[email or web address]  ** This summary of benefits is an estimation of the patient's out-of-pocket cost. Exact cost may very based on individual plan coverage.

## 2024-03-05 NOTE — Telephone Encounter (Addendum)
 PA form and documentation faxed to [phone]. Copy of PA form attached to patient's chart.

## 2024-03-07 NOTE — Telephone Encounter (Signed)
 Spoke with patient. She does not want to take the prolia at all and prefers to stay on the fosamax she is currently being prescribed.

## 2024-03-10 ENCOUNTER — Other Ambulatory Visit: Payer: Self-pay | Admitting: Cardiology

## 2024-03-10 NOTE — Telephone Encounter (Signed)
 Noted- ok to close.

## 2024-03-12 ENCOUNTER — Encounter: Payer: Self-pay | Admitting: Cardiology

## 2024-07-18 ENCOUNTER — Ambulatory Visit: Admitting: Cardiology
